# Patient Record
Sex: Female | Born: 1970
Health system: Southern US, Community
[De-identification: ages and names within clinical notes are randomized; demographics above are authoritative.]

## PROBLEM LIST (undated history)

## (undated) DIAGNOSIS — F509 Eating disorder, unspecified: Secondary | ICD-10-CM

## (undated) DIAGNOSIS — F419 Anxiety disorder, unspecified: Secondary | ICD-10-CM

## (undated) DIAGNOSIS — T7840XA Allergy, unspecified, initial encounter: Secondary | ICD-10-CM

## (undated) DIAGNOSIS — B019 Varicella without complication: Secondary | ICD-10-CM

## (undated) DIAGNOSIS — N3289 Other specified disorders of bladder: Secondary | ICD-10-CM

## (undated) DIAGNOSIS — R87619 Unspecified abnormal cytological findings in specimens from cervix uteri: Secondary | ICD-10-CM

## (undated) DIAGNOSIS — E079 Disorder of thyroid, unspecified: Secondary | ICD-10-CM

## (undated) DIAGNOSIS — J45909 Unspecified asthma, uncomplicated: Secondary | ICD-10-CM

## (undated) DIAGNOSIS — E559 Vitamin D deficiency, unspecified: Secondary | ICD-10-CM

## (undated) DIAGNOSIS — N3281 Overactive bladder: Secondary | ICD-10-CM

## (undated) HISTORY — DX: Vitamin D deficiency, unspecified: E55.9

## (undated) HISTORY — DX: Anxiety disorder, unspecified: F41.9

## (undated) HISTORY — DX: Allergy, unspecified, initial encounter: T78.40XA

## (undated) HISTORY — DX: Varicella without complication: B01.9

## (undated) HISTORY — DX: Unspecified abnormal cytological findings in specimens from cervix uteri: R87.619

## (undated) HISTORY — PX: WISDOM TOOTH EXTRACTION: SHX21

## (undated) HISTORY — DX: Eating disorder, unspecified: F50.9

## (undated) HISTORY — DX: Disorder of thyroid, unspecified: E07.9

## (undated) HISTORY — DX: Overactive bladder: N32.81

## (undated) HISTORY — DX: Other specified disorders of bladder: N32.89

---
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
## 1998-09-04 HISTORY — PX: BREAST BIOPSY: SHX20

## 2003-09-05 HISTORY — PX: HYMENECTOMY: SHX987

## 2007-09-05 HISTORY — PX: CERVICAL CERCLAGE: SHX1329

## 2008-10-07 ENCOUNTER — Encounter: Admission: RE | Admit: 2008-10-07 | Discharge: 2008-10-07 | Payer: Self-pay | Admitting: Unknown Physician Specialty

## 2009-04-19 ENCOUNTER — Encounter: Admission: RE | Admit: 2009-04-19 | Discharge: 2009-04-19 | Payer: Self-pay | Admitting: Unknown Physician Specialty

## 2012-11-14 DIAGNOSIS — Z8639 Personal history of other endocrine, nutritional and metabolic disease: Secondary | ICD-10-CM | POA: Insufficient documentation

## 2013-04-16 ENCOUNTER — Encounter: Payer: Self-pay | Admitting: Internal Medicine

## 2013-04-16 ENCOUNTER — Ambulatory Visit (INDEPENDENT_AMBULATORY_CARE_PROVIDER_SITE_OTHER): Payer: BC Managed Care – PPO | Admitting: Internal Medicine

## 2013-04-16 VITALS — BP 110/70 | HR 87 | Temp 97.4°F | Resp 20 | Ht 65.0 in | Wt 153.0 lb

## 2013-04-16 DIAGNOSIS — R946 Abnormal results of thyroid function studies: Secondary | ICD-10-CM

## 2013-04-16 DIAGNOSIS — B9689 Other specified bacterial agents as the cause of diseases classified elsewhere: Secondary | ICD-10-CM

## 2013-04-16 DIAGNOSIS — A499 Bacterial infection, unspecified: Secondary | ICD-10-CM

## 2013-04-16 DIAGNOSIS — R7989 Other specified abnormal findings of blood chemistry: Secondary | ICD-10-CM

## 2013-04-16 DIAGNOSIS — N926 Irregular menstruation, unspecified: Secondary | ICD-10-CM

## 2013-04-16 DIAGNOSIS — E559 Vitamin D deficiency, unspecified: Secondary | ICD-10-CM

## 2013-04-16 DIAGNOSIS — N760 Acute vaginitis: Secondary | ICD-10-CM

## 2013-04-16 LAB — COMPREHENSIVE METABOLIC PANEL
ALT: 11 U/L (ref 0–35)
AST: 14 U/L (ref 0–37)
CO2: 29 mEq/L (ref 19–32)
Calcium: 9.2 mg/dL (ref 8.4–10.5)
Chloride: 104 mEq/L (ref 96–112)
Creat: 0.73 mg/dL (ref 0.50–1.10)
Sodium: 137 mEq/L (ref 135–145)
Total Protein: 6.8 g/dL (ref 6.0–8.3)

## 2013-04-16 LAB — LIPID PANEL
HDL: 56 mg/dL (ref >39)
LDL Cholesterol: 74 mg/dL (ref 0–99)
Total CHOL/HDL Ratio: 2.6 Ratio

## 2013-04-16 LAB — CBC WITH DIFFERENTIAL/PLATELET
Basophils Absolute: 0 10*3/uL (ref 0.0–0.1)
Lymphocytes Relative: 28 % (ref 12–46)
Lymphs Abs: 2.2 10*3/uL (ref 0.7–4.0)
Neutro Abs: 4.6 10*3/uL (ref 1.7–7.7)
Neutrophils Relative %: 60 % (ref 43–77)
Platelets: 244 10*3/uL (ref 150–400)
RBC: 4.37 MIL/uL (ref 3.87–5.11)
RDW: 13.2 % (ref 11.5–15.5)
WBC: 7.7 10*3/uL (ref 4.0–10.5)

## 2013-04-16 NOTE — Progress Notes (Addendum)
  Subjective:    Patient ID: Jamie Miller, female    DOB: [birth year], 42 y.o.   MRN: [id number]  HPI Jamie Miller is here as a new pt for first visit  PMH of abnormal thyroid test, vitamin D deficiency, chronic bacterial vaginosis, and allergic rhinitis.  She is concerned if she is peri-menopausal.  She reports irregular periods  With heavy menses at times. She has been evaluated at [facility] for this and tells me she had an ultrasound.  I do not have those results.    She also notes she is irritable around her menses.     Review of Systems See HPI    Objective:   Physical Exam Physical Exam  Nursing note and vitals reviewed.  Constitutional: She is oriented to person, place, and time. She appears well-developed and well-nourished.  HENT:  Head: Normocephalic and atraumatic.  Cardiovascular: Normal rate and regular rhythm. Exam reveals no gallop and no friction rub.  No murmur heard.  Pulmonary/Chest: Breath sounds normal. She has no wheezes. She has no rales.  Neurological: She is alert and oriented to person, place, and time.  Skin: Skin is warm and dry.  Psychiatric: She has a normal mood and affect. Her behavior is normal.              Assessment & Plan:  Irregular menses    Will check labs TSH, prolactin,  FSH.  Will need GYN notes  Vitamin D deficinecy  Allergic rhintits  History of bacterial vaginosis  Follow up with me in 10 days  Addendum  8/17  Review of faxed notes from Dr. Rito Ehrlich  U/S was performed and reported as normal in office note of 02/28/2013.  See scanned report 6/27

## 2013-04-17 LAB — FOLLICLE STIMULATING HORMONE: FSH: 7.3 m[IU]/mL

## 2013-04-17 LAB — T3, FREE: T3, Free: 3.2 pg/mL (ref 2.3–4.2)

## 2013-04-17 LAB — TSH: TSH: 4.661 u[IU]/mL — ABNORMAL HIGH (ref 0.350–4.500)

## 2013-04-17 LAB — T4, FREE: Free T4: 1.24 ng/dL (ref 0.80–1.80)

## 2013-04-17 LAB — PROLACTIN: Prolactin: 9.2 ng/mL

## 2013-04-18 ENCOUNTER — Encounter: Payer: Self-pay | Admitting: *Deleted

## 2013-04-21 ENCOUNTER — Encounter: Payer: Self-pay | Admitting: *Deleted

## 2013-04-29 ENCOUNTER — Ambulatory Visit (INDEPENDENT_AMBULATORY_CARE_PROVIDER_SITE_OTHER): Payer: BC Managed Care – PPO | Admitting: Internal Medicine

## 2013-04-29 ENCOUNTER — Encounter: Payer: Self-pay | Admitting: Internal Medicine

## 2013-04-29 VITALS — BP 112/77 | HR 80 | Temp 96.8°F | Resp 18 | Wt 153.0 lb

## 2013-04-29 DIAGNOSIS — R7989 Other specified abnormal findings of blood chemistry: Secondary | ICD-10-CM

## 2013-04-29 DIAGNOSIS — R946 Abnormal results of thyroid function studies: Secondary | ICD-10-CM

## 2013-04-29 DIAGNOSIS — N926 Irregular menstruation, unspecified: Secondary | ICD-10-CM

## 2013-04-29 DIAGNOSIS — R454 Irritability and anger: Secondary | ICD-10-CM | POA: Insufficient documentation

## 2013-04-29 NOTE — Progress Notes (Signed)
Subjective:    Patient ID: Jamie Miller, female    DOB: Oct 06, 1970, 42 y.o.   MRN: [id number]  HPI Jamie Miller is here for follow up.  She states she is feeling some better since she has less child rearing responsibilities during the daytime now that her daughter has started kindergarten.    She states she still has some irritability about 10 days prior to menses.  She does not wish to start an anti-depressant or any prescribed medication for this  See labs  TSH minimally elevateed with normal free levels.  She denies symptoms of weight gain,  Hair loss dry skin.  NO history of neck or chest radiation.  She denies heavy menses or clotting  Allergies  Allergen Reactions  . Almond Oil   . Keflex [Cephalexin]   . Nickel   . Other     Sensitive to all abx    History reviewed. No pertinent past medical history. Past Surgical History  Procedure Laterality Date  . Appendectomy    . Hymenectomy    . Cervical cerclage     History   Social History  . Marital Status: Unknown    Spouse Name: N/A    Number of Children: N/A  . Years of Education: N/A   Occupational History  . Not on file.   Social History Main Topics  . Smoking status: Never Smoker   . Smokeless tobacco: Never Used  . Alcohol Use: No  . Drug Use: No  . Sexual Activity: Yes    Birth Control/ Protection: Surgical   Other Topics Concern  . Not on file   Social History Narrative  . No narrative on file   Family History  Problem Relation Age of Onset  . Hyperlipidemia Mother   . Cancer Father     prostate  . Cancer Maternal Grandfather     prostate  . Cancer Paternal Grandfather     prostate   Patient Active Problem List   Diagnosis Date Noted  . Irritability 04/29/2013  . Irregular menses 04/16/2013  . Abnormal thyroid blood test 04/16/2013  . Bacterial vaginosis 04/16/2013  . Vitamin D deficiency 04/16/2013   Current Outpatient Prescriptions on File Prior to Visit  Medication Sig Dispense Refill  .  cholecalciferol (VITAMIN D) 1000 UNITS tablet Take 4,000 Units by mouth daily.      . fish oil-omega-3 fatty acids 1000 MG capsule Take 2 g by mouth daily.      . vitamin E 200 UNIT capsule Take 200 Units by mouth daily.      . Boric Acid 4 % SOLN Place vaginally.      . Loratadine 10 MG CAPS Take 10 mg by mouth as needed.      . MULTIPLE VITAMIN PO Take 1 tablet by mouth daily.       No current facility-administered medications on file prior to visit.       Review of Systems See HPi    Objective:   Physical Exam Physical Exam  Nursing note and vitals reviewed.  Constitutional: She is oriented to person, place, and time. She appears well-developed and well-nourished.  HENT:  Head: Normocephalic and atraumatic.  Neck  No thyromegaly no discrete nodules on thyroid exam Cardiovascular: Normal rate and regular rhythm. Exam reveals no gallop and no friction rub.  No murmur heard.  Pulmonary/Chest: Breath sounds normal. She has no wheezes. She has no rales.  Neurological: She is alert and oriented to person, place, and time.  Skin: Skin is warm and dry.  Psychiatric: She has a normal mood and affect. Her behavior is normal.             Assessment & Plan:  Irritability/ ??  PMS     She does not wish any prescribed meds for this.    Minimally elevated TSH  will moniter expectantly  She has no S/S of hypothyroidism or radiation exposure  Irregular menses  ADvised to follow up with Dr. Rito Ehrlich for this especially if menses are heavy

## 2014-07-06 ENCOUNTER — Encounter: Payer: Self-pay | Admitting: Internal Medicine

## 2014-09-16 DIAGNOSIS — E663 Overweight: Secondary | ICD-10-CM | POA: Insufficient documentation

## 2015-08-10 ENCOUNTER — Encounter: Payer: Self-pay | Admitting: Obstetrics & Gynecology

## 2015-08-10 ENCOUNTER — Ambulatory Visit (INDEPENDENT_AMBULATORY_CARE_PROVIDER_SITE_OTHER): Payer: BLUE CROSS/BLUE SHIELD | Admitting: Obstetrics & Gynecology

## 2015-08-10 VITALS — Ht 65.0 in | Wt 154.0 lb

## 2015-08-10 DIAGNOSIS — N898 Other specified noninflammatory disorders of vagina: Secondary | ICD-10-CM | POA: Diagnosis not present

## 2015-08-10 MED ORDER — CLOBETASOL PROPIONATE 0.05 % EX OINT: TOPICAL_OINTMENT | CUTANEOUS | Status: DC

## 2015-08-10 MED ORDER — LIDOCAINE HCL 2 % EX GEL: 1.0000 "application " | CUTANEOUS | Status: DC | PRN

## 2015-08-10 MED ORDER — FLUCONAZOLE 150 MG PO TABS: 150.0000 mg | ORAL_TABLET | Freq: Once | ORAL | Status: DC

## 2015-08-10 NOTE — Progress Notes (Signed)
   Subjective:    Patient ID: Jamie Miller, female    DOB: 02-Jan-1971, 44 y.o.   MRN: [id number]  HPI 44 yo MW P24 (31 yo daughter) here today as a new patient with the issue of a week's h/o vaginal/anal itching/burning/redness. She had some vaginal discharge but it decreased and is sporadic. She used hydrocortisone OTC and zinc oxide cream, baking soda baths.   Review of Systems Pap smear at [facility] last week and normal  She was treated for a UTI last week She also wore clothes washed with a different detergent. She reports longstanding dyspareunia    Objective:   Physical Exam  WNWHWFNAD Breathing and conversing normally Abd- benign EG- symmetric erythema of both labia and perianal area, c/w contact dermatitis Speculum exam reveals a bit of thick white discharge      Assessment & Plan:  Contract dermatitis Possible yeast- wet prep sent Topical lidocaine, diflucan, and temovate prescribed

## 2015-08-11 ENCOUNTER — Telehealth: Payer: Self-pay | Admitting: *Deleted

## 2015-08-11 LAB — WET PREP BY MOLECULAR PROBE
CANDIDA SPECIES: POSITIVE — AB
GARDNERELLA VAGINALIS: NEGATIVE
TRICHOMONAS VAG: NEGATIVE

## 2015-08-11 NOTE — Telephone Encounter (Signed)
Pt notified of positive yeast and appropriate RX was sent to her pharmacy

## 2015-08-13 ENCOUNTER — Encounter: Payer: Self-pay | Admitting: Obstetrics & Gynecology

## 2015-08-23 ENCOUNTER — Encounter: Payer: Self-pay | Admitting: Obstetrics & Gynecology

## 2015-08-25 ENCOUNTER — Ambulatory Visit (INDEPENDENT_AMBULATORY_CARE_PROVIDER_SITE_OTHER): Payer: BLUE CROSS/BLUE SHIELD | Admitting: Obstetrics & Gynecology

## 2015-08-25 ENCOUNTER — Encounter: Payer: Self-pay | Admitting: Obstetrics & Gynecology

## 2015-08-25 VITALS — BP 115/74 | HR 77 | Resp 16 | Ht 65.0 in | Wt 153.0 lb

## 2015-08-25 DIAGNOSIS — N941 Unspecified dyspareunia: Secondary | ICD-10-CM | POA: Insufficient documentation

## 2015-08-25 DIAGNOSIS — N898 Other specified noninflammatory disorders of vagina: Secondary | ICD-10-CM | POA: Diagnosis not present

## 2015-08-25 DIAGNOSIS — R350 Frequency of micturition: Secondary | ICD-10-CM

## 2015-08-25 LAB — POCT URINALYSIS DIPSTICK
BILIRUBIN UA: NEGATIVE
Blood, UA: NEGATIVE
GLUCOSE UA: NEGATIVE
KETONES UA: NEGATIVE
LEUKOCYTES UA: NEGATIVE
Nitrite, UA: NEGATIVE
Protein, UA: NEGATIVE
Spec Grav, UA: 1.02
Urobilinogen, UA: 0.2
pH, UA: 5

## 2015-08-25 NOTE — Progress Notes (Signed)
Patient ID: Jamie Miller, female   DOB: Feb 27, 1971, 44 y.o.   MRN: [id number] Pt here today for eval of vaginal discharge and odor. Pt also states she has urinary frequency and right side abd pain. Sx has been persistent for 3 days.

## 2015-08-25 NOTE — Progress Notes (Signed)
   Subjective:    Patient ID: Jamie Miller, female    DOB: 15-May-1971, 44 y.o.   MRN: [id number]  HPI  44 yo female presents for urinary frequency and pain.  She also having vaginal discharge.  Pt recently treated with diflucan on 08/09/14.  Symptoms improved but returned with the urinary frequency adn bladder spasms occurred.  Pt has long standing history of vaginal and pelvic pain.  Intercourse is painful.  [facility] which has helped.  Review of Systems  Constitutional: Negative.   Gastrointestinal: Negative.   Genitourinary: Positive for frequency, vaginal discharge, vaginal pain, pelvic pain and dyspareunia.  Psychiatric/Behavioral: Negative.        Objective:   Physical Exam  Constitutional: She appears well-developed and well-nourished. No distress.  HENT:  Head: Normocephalic and atraumatic.  Eyes: Conjunctivae are normal.  Pulmonary/Chest: Effort normal.  Abdominal: Soft. She exhibits no distension. There is no tenderness.  Genitourinary:  Vulva--Taneer V, no lesion, non tneder with q tip around introitus (no evidence of vulvodynia) Vagina-clear mucous, no blood, no lesion.  There is ridge of tissue (?scarring from ob laceration) about 2 cm into vagina that is tneder Uterus-mobile, mild tenderness Adnexa-no masses, non tender  Skin: Skin is warm and dry.  Contact dermatitis resolved  Psychiatric: She has a normal mood and affect.  Vitals reviewed.         Assessment & Plan:  44 yo female with 2 new problems and 1 long standing problem  1-urinary frequency and bladder spasms--UA nml today U cx sent.  Will treat on results 2-vaginal discharge--return of vaginal discharge.  BD affirm sent.  No obvious infection to treat today 3-pelvic pain and dyspareunia--continue physical therapy.  Consider surgery to remove scar tissue in vagina.

## 2015-08-26 ENCOUNTER — Encounter: Payer: Self-pay | Admitting: Obstetrics & Gynecology

## 2015-08-26 LAB — WET PREP BY MOLECULAR PROBE
CANDIDA SPECIES: NEGATIVE
Gardnerella vaginalis: NEGATIVE
TRICHOMONAS VAG: NEGATIVE

## 2015-08-26 LAB — CULTURE, URINE COMPREHENSIVE
Colony Count: NO GROWTH
Organism ID, Bacteria: NO GROWTH

## 2016-01-26 ENCOUNTER — Encounter: Payer: Self-pay | Admitting: Obstetrics & Gynecology

## 2016-01-27 ENCOUNTER — Other Ambulatory Visit: Payer: BLUE CROSS/BLUE SHIELD

## 2016-01-27 ENCOUNTER — Ambulatory Visit (INDEPENDENT_AMBULATORY_CARE_PROVIDER_SITE_OTHER): Payer: 59 | Admitting: Obstetrics & Gynecology

## 2016-01-27 ENCOUNTER — Encounter: Payer: Self-pay | Admitting: Obstetrics & Gynecology

## 2016-01-27 VITALS — BP 113/74 | HR 89 | Temp 97.1°F | Resp 16 | Ht 65.0 in | Wt 154.0 lb

## 2016-01-27 DIAGNOSIS — N3289 Other specified disorders of bladder: Secondary | ICD-10-CM

## 2016-01-27 DIAGNOSIS — R102 Pelvic and perineal pain: Secondary | ICD-10-CM | POA: Diagnosis not present

## 2016-01-27 LAB — POCT URINALYSIS DIPSTICK
Bilirubin, UA: NEGATIVE
Blood, UA: NEGATIVE
GLUCOSE UA: NEGATIVE
KETONES UA: NEGATIVE
Leukocytes, UA: NEGATIVE
Nitrite, UA: NEGATIVE
Protein, UA: NEGATIVE
Urobilinogen, UA: NEGATIVE
pH, UA: 6.5

## 2016-01-27 NOTE — Progress Notes (Signed)
   Subjective:    Patient ID: Jamie Miller, female    DOB: 08-Jan-1971, 45 y.o.   MRN: [id number]  HPI 45 yo MW P1 here with continued bladder spasms, "OAB". She also reports that she saw some vaginal discharge this morning and may have a little vaginal itch.   Review of Systems     Objective:   Physical Exam WNWHWFNAD Breathing, conversing, and ambulating normally Vagina- moderate atrophy, paucity of discharge, no odor      Assessment & Plan:  Bladder spasms- culture sent (UA negative), urispas prescribed prn Wet prep sent, but I expect this to be normal

## 2016-01-28 LAB — WET PREP FOR TRICH, YEAST, CLUE
TRICH WET PREP: NONE SEEN
WBC, Wet Prep HPF POC: NONE SEEN
Yeast Wet Prep HPF POC: NONE SEEN

## 2016-01-29 LAB — CULTURE, URINE COMPREHENSIVE
COLONY COUNT: NO GROWTH
ORGANISM ID, BACTERIA: NO GROWTH

## 2016-02-01 ENCOUNTER — Telehealth: Payer: Self-pay | Admitting: *Deleted

## 2016-02-01 NOTE — Telephone Encounter (Signed)
No answer on home phone or answering machine

## 2016-02-03 ENCOUNTER — Encounter: Payer: Self-pay | Admitting: Obstetrics & Gynecology

## 2016-04-17 ENCOUNTER — Encounter: Payer: Self-pay | Admitting: Obstetrics & Gynecology

## 2016-04-20 ENCOUNTER — Ambulatory Visit: Payer: 59 | Admitting: Obstetrics & Gynecology

## 2016-10-03 ENCOUNTER — Ambulatory Visit (HOSPITAL_BASED_OUTPATIENT_CLINIC_OR_DEPARTMENT_OTHER)
Admission: RE | Admit: 2016-10-03 | Discharge: 2016-10-03 | Disposition: A | Discharge status: Home / Self Care | Payer: 59 | Source: Ambulatory Visit | Attending: Family Medicine | Admitting: Family Medicine

## 2016-10-03 ENCOUNTER — Encounter: Payer: Self-pay | Admitting: Family Medicine

## 2016-10-03 ENCOUNTER — Ambulatory Visit (INDEPENDENT_AMBULATORY_CARE_PROVIDER_SITE_OTHER): Payer: 59 | Admitting: Family Medicine

## 2016-10-03 ENCOUNTER — Telehealth: Payer: Self-pay | Admitting: Family Medicine

## 2016-10-03 VITALS — BP 125/84 | HR 87 | Temp 98.1°F | Resp 20 | Ht 63.5 in | Wt 145.2 lb

## 2016-10-03 DIAGNOSIS — R1030 Lower abdominal pain, unspecified: Secondary | ICD-10-CM | POA: Insufficient documentation

## 2016-10-03 DIAGNOSIS — N926 Irregular menstruation, unspecified: Secondary | ICD-10-CM

## 2016-10-03 DIAGNOSIS — Z7689 Persons encountering health services in other specified circumstances: Secondary | ICD-10-CM

## 2016-10-03 LAB — COMPREHENSIVE METABOLIC PANEL
ALT: 43 U/L — ABNORMAL HIGH (ref 0–35)
AST: 24 U/L (ref 0–37)
Albumin: 4.2 g/dL (ref 3.5–5.2)
Alkaline Phosphatase: 77 U/L (ref 39–117)
BUN: 13 mg/dL (ref 6–23)
CHLORIDE: 103 meq/L (ref 96–112)
CO2: 30 meq/L (ref 19–32)
Calcium: 9.3 mg/dL (ref 8.4–10.5)
Creatinine, Ser: 0.72 mg/dL (ref 0.40–1.20)
GFR: 92.95 mL/min (ref >60.00)
GLUCOSE: 100 mg/dL — AB (ref 70–99)
POTASSIUM: 4.6 meq/L (ref 3.5–5.1)
SODIUM: 138 meq/L (ref 135–145)
Total Bilirubin: 0.5 mg/dL (ref 0.2–1.2)
Total Protein: 7 g/dL (ref 6.0–8.3)

## 2016-10-03 LAB — LIPASE: Lipase: 40 U/L (ref 11.0–59.0)

## 2016-10-03 LAB — URINALYSIS, ROUTINE W REFLEX MICROSCOPIC
BILIRUBIN URINE: NEGATIVE
Hgb urine dipstick: NEGATIVE
KETONES UR: NEGATIVE
LEUKOCYTES UA: NEGATIVE
NITRITE: NEGATIVE
PH: 6 (ref 5.0–8.0)
Total Protein, Urine: NEGATIVE
URINE GLUCOSE: NEGATIVE
Urobilinogen, UA: 0.2 (ref 0.0–1.0)

## 2016-10-03 LAB — CBC WITH DIFFERENTIAL/PLATELET
BASOS PCT: 0.7 % (ref 0.0–3.0)
Basophils Absolute: 0.1 10*3/uL (ref 0.0–0.1)
EOS PCT: 1.1 % (ref 0.0–5.0)
Eosinophils Absolute: 0.1 10*3/uL (ref 0.0–0.7)
HCT: 42.6 % (ref 36.0–46.0)
Hemoglobin: 14.5 g/dL (ref 12.0–15.0)
LYMPHS ABS: 1.4 10*3/uL (ref 0.7–4.0)
Lymphocytes Relative: 18 % (ref 12.0–46.0)
MCHC: 34.1 g/dL (ref 30.0–36.0)
MCV: 93.5 fl (ref 78.0–100.0)
MONO ABS: 0.7 10*3/uL (ref 0.1–1.0)
Monocytes Relative: 8.6 % (ref 3.0–12.0)
NEUTROS ABS: 5.5 10*3/uL (ref 1.4–7.7)
NEUTROS PCT: 71.6 % (ref 43.0–77.0)
PLATELETS: 230 10*3/uL (ref 150.0–400.0)
RBC: 4.55 Mil/uL (ref 3.87–5.11)
RDW: 13 % (ref 11.5–15.5)
WBC: 7.7 10*3/uL (ref 4.0–10.5)

## 2016-10-03 LAB — H. PYLORI ANTIBODY, IGG: H Pylori IgG: NEGATIVE

## 2016-10-03 LAB — C-REACTIVE PROTEIN: CRP: 0.3 mg/dL — AB (ref 0.5–20.0)

## 2016-10-03 LAB — POCT URINE PREGNANCY: PREG TEST UR: NEGATIVE

## 2016-10-03 NOTE — Telephone Encounter (Signed)
Spoke with patient reviewed xray results and instructions. Patient verbalized understanding. 

## 2016-10-03 NOTE — Telephone Encounter (Signed)
Please call pt: - her xray of her abdomen resulted with a rather significant stool burden, which is the likely cause of her discomfort.  - in addition to what we discussed during her appt, I would like her to start a cap of miralax twice a day for 2 days, then decrease to 1 cap daily until BMs are moving daily and stomach pain/cramping improved.  - we will call her with lab results once available.

## 2016-10-03 NOTE — Progress Notes (Signed)
Patient ID: Jamie Miller, female  DOB: 1971/07/15, 46 y.o.   MRN: [id number] Patient Care Team    Relationship Specialty Notifications Start End  Jamie Hillock, DO PCP - General Family Medicine  10/03/16     Subjective:  Jamie Miller is a 46 y.o.  female present for new patient establishment. All past medical history, surgical history, allergies, family history, immunizations, medications and social history were obtained and updated in the electronic medical record today. All recent labs, ED visits and hospitalizations within the last year were reviewed.  Pt presents today to establish and discuss her abdominal pain. She states Her discomfort started with left sided abdominal cramping last week. This has improved but is now a continued  dull ache. Her bowel movements were loose for 2 days surrounding the cramping, then she went a few days without a BM. She feels they have been normal for the last 2 days. She denies nausea, vomit, melena, hematochezia,  dysuria, fever or chills. She endorses late menses, increased belching and early satiety.  She reports her menstrual cycle is overdue, usually 21 days between cycles and was due beginning of December. Her husband had a vasectomy many years ago. She started to taking prilosec 2 days ago to help with symptoms and she thought it helped the first day only. She does note she started eating "healthier" a few days prior to the onset and had realized she was consuming more acidic foods and drinking moire coffee. She also tried metamucil and that was not effective.    There is no immunization history on file for this patient.   Past Medical History:  Diagnosis Date  . Allergy   . Anxiety   . Chicken pox   . Thyroid disease    was on Jamie dose synthroid at one time, but removed and levels have been "normal" since and followed thorugh GYN   Allergies  Allergen Reactions  . Dairy Aid  [Lactase] Rash  . Eggs Or Egg-Derived Products Rash  . Hemp  Seed Oil  [Dronabinol] Rash    itching  . Levothyroxine     Other reaction(s): Other Tingling in legs; heart palpitation  . Metronidazole     Other reaction(s): Other Cramps  . Nitrofurantoin Monohyd Macro Diarrhea    Other reaction(s): Other Cramps  . Strawberry (Diagnostic) Rash    itching  . Wheat Bran Rash  . Almond Oil   . Keflex [Cephalexin]   . Nickel   . Other     Sensitive to all abx   . Clindamycin     Other reaction(s): Abdominal Pain  . Cranberry     Other reaction(s): Abdominal Pain   Past Surgical History:  Procedure Laterality Date  . APPENDECTOMY  1990  . BREAST BIOPSY  2000   biopsy  . CERVICAL CERCLAGE  2009  . HYMENECTOMY  2005   Family History  Problem Relation Age of Onset  . Hyperlipidemia Mother   . Diabetes Father   . Prostate cancer Father     prostate  . Prostate cancer Maternal Grandfather     prostate  . Prostate cancer Paternal Grandfather     prostate  . Arthritis Maternal Grandmother    Social History   Social History  . Marital status: Married    Spouse name: Jamie Miller  . Number of children: 1  . Years of education: N/A   Occupational History  . Not on file.   Social History Main Topics  .  Smoking status: Never Smoker  . Smokeless tobacco: Never Used  . Alcohol use No  . Drug use: No  . Sexual activity: Yes    Partners: Male    Birth control/ protection: Surgical     Comment: married   Other Topics Concern  . Not on file   Social History Narrative   Married to Jamie Miller. Has a daughter Jamie Miller.    College degree.    Drinks caffeine. Uses herbal remedies. Takes a daily vitamin.    Wears her seatbelt   Smoke detector in the home.    Feels safe in her relationships.    Allergies as of 10/03/2016      Reactions   Dairy Aid  [lactase] Rash   Eggs Or Egg-derived Products Rash   Hemp Seed Oil  [dronabinol] Rash   itching   Levothyroxine    Other reaction(s): Other Tingling in legs; heart palpitation   Metronidazole     Other reaction(s): Other Cramps   Nitrofurantoin Monohyd Macro Diarrhea   Other reaction(s): Other Cramps   Strawberry (diagnostic) Rash   itching   Wheat Bran Rash   Almond Oil    Keflex [cephalexin]    Nickel    Other    Sensitive to all abx    Clindamycin    Other reaction(s): Abdominal Pain   Cranberry    Other reaction(s): Abdominal Pain      Medication List       Accurate as of 10/03/16 11:56 AM. Always use your most recent med list.          Acidophilus/Goat Milk Caps Take by mouth.   Boric Acid 4 % Soln Place vaginally. Reported on 01/27/2016   Loratadine 10 MG Caps Take 10 mg by mouth as needed. Reported on 01/27/2016   MULTIPLE VITAMIN PO Take 1 tablet by mouth daily.        Recent Results (from the past 2160 hour(s))  POCT urine pregnancy     Status: Normal   Collection Time: 10/03/16 10:49 AM  Result Value Ref Range   Preg Test, Ur Negative Negative    US Breast Left  Result Date: 04/19/2009 Clinical Data:  The patient returns for short interval follow-up of a probably benign finding in the upper outer quadrant of the left breast.  DIGITAL DIAGNOSTIC  LEFT  MAMMOGRAM  WITH CAD AND LEFT BREAST ULTRASOUND:  Comparison: 08/31/2008 and 09/29/2008 from [facility] in [place], [place] and 03/05/2000 from [facility] in [place], [place]  Findings: The breast tissue is almost entirely fatty.  The vague Jamie density in the upper outer quadrant posteriorly is stable.  No suspicious mass, distortion or calcification to suggest malignancy is identified. Mammographic images were processed with CAD.  On physical exam, no mass is palpated in the left upper outer quadrant.  The patient asked about a skin lesion in the outer portion of the left breast which appears to represent a cherry angioma.  Ultrasound is performed, showing an area of mixed echogenicity at 2:30 o'clock 7 cm from the left nipple measuring about 1.8 cm. This  is felt likely to represent a benign fibroadenolipoma (hamartoma). This is stable as compared to prior exam.  IMPRESSION: Stable benign appearing density in the outer portion the left breast which is felt to be consistent with a benign fibroadenolipoma (hamartoma).  Yearly screening mammography should begin at age 35 unless clinically indicated earlier.  BI-RADS CATEGORY 2:  Benign finding(s). Provider: Karna Dupes, Benjamine Mola  [facility] [place]  Mm Digital Diagnostic Unilat L  Result Date: 04/19/2009 Clinical Data:  The patient returns for short interval follow-up of a probably benign finding in the upper outer quadrant of the left breast.  DIGITAL DIAGNOSTIC  LEFT  MAMMOGRAM  WITH CAD AND LEFT BREAST ULTRASOUND:  Comparison: 08/31/2008 and 09/29/2008 from [facility] in [place], [place] and 03/05/2000 from [facility] in [place], [place]  Findings: The breast tissue is almost entirely fatty.  The vague Jamie density in the upper outer quadrant posteriorly is stable.  No suspicious mass, distortion or calcification to suggest malignancy is identified. Mammographic images were processed with CAD.  On physical exam, no mass is palpated in the left upper outer quadrant.  The patient asked about a skin lesion in the outer portion of the left breast which appears to represent a cherry angioma.  Ultrasound is performed, showing an area of mixed echogenicity at 2:30 o'clock 7 cm from the left nipple measuring about 1.8 cm. This is felt likely to represent a benign fibroadenolipoma (hamartoma). This is stable as compared to prior exam.  IMPRESSION: Stable benign appearing density in the outer portion the left breast which is felt to be consistent with a benign fibroadenolipoma (hamartoma).  Yearly screening mammography should begin at age 65 unless clinically indicated earlier.  BI-RADS CATEGORY 2:  Benign finding(s). Provider: Lou Cal    ROS: 14 pt review of systems performed  and negative (unless mentioned in an HPI)  Objective: BP 125/84 (BP Location: Left Arm, Patient Position: Sitting, Cuff Size: Normal)   Pulse 87   Temp 98.1 F (36.7 C)   Resp 20   Ht 5' 3.5" (1.613 m)   Wt 145 lb 4 oz (65.9 kg)   LMP 08/07/2016   SpO2 97%   BMI 25.33 kg/m  Gen: Afebrile. No acute distress. Nontoxic in appearance, well-developed, well-nourished HENT: AT. [place].MMM Eyes:Pupils Equal Round Reactive to light, Extraocular movements intact,  Conjunctiva without redness, discharge or icterus. Neck/lymp/endocrine: Supple,no lymphadenopathy CV: RRR, no edema Chest: CTAB, no wheeze, rhonchi or crackles. Abd: hard midline lower abd. TTP lower midline abd. Flat. ND. BS present  ? Stool burden/Masses palpated. No hepatosplenomegaly. No rebound tenderness or guarding. Skin:  Warm and well-perfused. Skin intact. Neuro/Msk: Normal gait. PERLA. EOMi. Alert. Oriented x3.     Assessment/plan: Jamie Miller is a 46 y.o. female present for establishment with abd discomfort.  Lower abdominal pain - uncertain etiology of her discomfort at this time. Recommended she take Prilosec and start a bland diet until we get further answers by labs and imaging. She did lower abd fullness/hardness. TTP over this area. Discussed possibility of stool burden/constipation vs GERD gastritis vs uterine/GYN cause.  - increase water consumption to 60-80 ounces daily. - CBC w/Diff - Comp Met (CMET) - Lipase - H. pylori antibody, IgG - C-reactive protein - POCT urine pregnancy - DG Abd 2 Views; Future - Urinalysis, Routine w reflex microscopic Irregular menses - POCT urine pregnancy: NEGATIVE   Return in about 2 weeks (around 10/17/2016), or if symptoms worsen or fail to improve.  Electronically signed by: Howard Pouch, DO [place]

## 2016-10-03 NOTE — Patient Instructions (Signed)
It was a pleasure meeting you today.   Avoid spicy foods, try bland soft foods for a few days. Drink at least 60-80 ounces of water daily.  Take the prilosec over the counter daily until you hear from [place].  Please have your xray completed today at [place] off [address]  We will call you once we get all results.  This possibly could be constipation.     Constipation, Adult Constipation is when a person:  Poops (has a bowel movement) fewer times in a week than normal.  Has a hard time pooping.  Has poop that is dry, hard, or bigger than normal. Follow these instructions at home: Eating and drinking  Eat foods that have a lot of fiber, such as:  Fresh fruits and vegetables.  Whole grains.  Beans.  Eat less of foods that are high in fat, low in fiber, or overly processed, such as:  [place] fries.  Hamburgers.  Cookies.  Candy.  Soda.  Drink enough fluid to keep your pee (urine) clear or pale yellow. General instructions  Exercise regularly or as told by your doctor.  Go to the restroom when you feel like you need to poop. Do not hold it in.  Take over-the-counter and prescription medicines only as told by your doctor. These include any fiber supplements.  Do pelvic floor retraining exercises, such as:  Doing deep breathing while relaxing your lower belly (abdomen).  Relaxing your pelvic floor while pooping.  Watch your condition for any changes.  Keep all follow-up visits as told by your doctor. This is important. Contact a doctor if:  You have pain that gets worse.  You have a fever.  You have not pooped for 4 days.  You throw up (vomit).  You are not hungry.  You lose weight.  You are bleeding from the anus.  You have thin, pencil-like poop (stool). Get help right away if:  You have a fever, and your symptoms suddenly get worse.  You leak poop or have blood in your poop.  Your belly feels hard or bigger than normal  (is bloated).  You have very bad belly pain.  You feel dizzy or you faint. This information is not intended to replace advice given to you by your health care provider. Make sure you discuss any questions you have with your health care provider. Document Released: 02/07/2008 Document Revised: 03/10/2016 Document Reviewed: 02/09/2016 Elsevier Interactive Patient Education  2017 [employer].

## 2016-10-04 ENCOUNTER — Telehealth: Payer: Self-pay | Admitting: Family Medicine

## 2016-10-04 NOTE — Telephone Encounter (Signed)
Please call pt: - her labs are normal with the exception of VERY mild elevation in one of her liver enzymes. This can occur for many reasons and is not alarmig at this time, since so mild. - I do want her to use the Miralax for the stool burden found on xray, and taper down as her stools become daily and soft. Use the OTC Prilosec daily as well.  - follow up in 2 weeks, sooner if worsening. Would investigate further at that time if not improving with above regimen and repeat LFT.

## 2016-10-04 NOTE — Telephone Encounter (Signed)
Left message for patient to return call to review lab results. 

## 2016-10-05 NOTE — Telephone Encounter (Signed)
Spoke with patient reviewed lab results and instructions with patient. Patient verbalized understanding . 

## 2016-10-06 ENCOUNTER — Encounter: Payer: Self-pay | Admitting: Family Medicine

## 2016-10-11 ENCOUNTER — Ambulatory Visit (INDEPENDENT_AMBULATORY_CARE_PROVIDER_SITE_OTHER): Payer: 59 | Admitting: Family Medicine

## 2016-10-11 ENCOUNTER — Encounter: Payer: Self-pay | Admitting: Family Medicine

## 2016-10-11 VITALS — BP 109/75 | HR 73 | Temp 97.3°F | Resp 20 | Wt 147.8 lb

## 2016-10-11 DIAGNOSIS — N926 Irregular menstruation, unspecified: Secondary | ICD-10-CM

## 2016-10-11 DIAGNOSIS — R1084 Generalized abdominal pain: Secondary | ICD-10-CM

## 2016-10-11 DIAGNOSIS — R7989 Other specified abnormal findings of blood chemistry: Secondary | ICD-10-CM

## 2016-10-11 DIAGNOSIS — N941 Unspecified dyspareunia: Secondary | ICD-10-CM | POA: Diagnosis not present

## 2016-10-11 DIAGNOSIS — R945 Abnormal results of liver function studies: Secondary | ICD-10-CM

## 2016-10-11 NOTE — Progress Notes (Signed)
Patient ID: Jamie Miller, female  DOB: November 29, 1970, 46 y.o.   MRN: [id number] Patient Care Team    Relationship Specialty Notifications Start End  Ma Hillock, DO PCP - General Family Medicine  10/03/16     Subjective:  Jamie Miller is a 46 y.o.  female present for follow up on abd pain.   Abdominal pain: pt presents for f/u on lower abd pain. Abd xray obtained last week resulted with moderate stool burden no acute process for pain. Her labs resulted with normal CBC, CRP, H. Pylori, Urinalysis and CMP with the exception of mildly elevated ALT (43). She also had a negative pregnancy test. She reports she has been taking the 1 cap miralax daily, drinking 50 ounces of water daily, taking the daily PPI. She also takes a daily probiotic. She reports daily bowel movement. Today she has had loose stools. She is still endorsing cramps lower left abdomen, and sometimes right lower abdomen, belching and early satiety. She sometimes feels food improves symptoms and sometimes worsens. She denies history of IBS/IBD personally or in her family. She denies FH h/o colon cancer. She denies melana, hematochezia. She has gained 2.5 lbs since last visit. She reports a normal PAP smear in 2016, which was normal with negative HPV.  Of note: was able to view last GYN appt in care-everywhere in which the pt complained "At end of appt mentions that she had weird cycle changes this month with a prolonged period, cramping, and diarrhea yesterday. Offered to bring back for US/SIS/embx vs observing for a cycle or two and return if continued issues. She opts to observe"   Prior note: Pt presents today to establish and discuss her abdominal pain. She states Her discomfort started with left sided abdominal cramping last week. This has improved but is now a continued  dull ache. Her bowel movements were loose for 2 days surrounding the cramping, then she went a few days without a BM. She feels they have been normal for the last  2 days. She denies nausea, vomit, melena, hematochezia,  dysuria, fever or chills. She endorses late menses, increased belching and early satiety.  She reports her menstrual cycle is overdue, usually 21 days between cycles and was due beginning of December. Her husband had a vasectomy many years ago. She started to taking prilosec 2 days ago to help with symptoms and she thought it helped the first day only. She does note she started eating "healthier" a few days prior to the onset and had realized she was consuming more acidic foods and drinking moire coffee. She also tried metamucil and that was not effective.   Dg Abd 2 Views  Result Date: 10/03/2016 CLINICAL DATA:  Lower midline abdominal pain for 4 days EXAM: ABDOMEN - 2 VIEW COMPARISON:  None. FINDINGS: Moderate stool burden. There is normal bowel gas pattern. No free air. No organomegaly or suspicious calcification. No acute bony abnormality. Lung bases are clear. IMPRESSION: Moderate stool burden.  No acute findings. Electronically Signed   By: Rolm Baptise M.D.   On: 10/03/2016 13:35   Recent Results (from the past 2160 hour(s))  CBC w/Diff     Status: None   Collection Time: 10/03/16 10:44 AM  Result Value Ref Range   WBC 7.7 4.0 - 10.5 K/uL   RBC 4.55 3.87 - 5.11 Mil/uL   Hemoglobin 14.5 12.0 - 15.0 g/dL   HCT 42.6 36.0 - 46.0 %   MCV 93.5 78.0 -  100.0 fl   MCHC 34.1 30.0 - 36.0 g/dL   RDW 13.0 11.5 - 15.5 %   Platelets 230.0 150.0 - 400.0 K/uL   Neutrophils Relative % 71.6 43.0 - 77.0 %   Lymphocytes Relative 18.0 12.0 - 46.0 %   Monocytes Relative 8.6 3.0 - 12.0 %   Eosinophils Relative 1.1 0.0 - 5.0 %   Basophils Relative 0.7 0.0 - 3.0 %   Neutro Abs 5.5 1.4 - 7.7 K/uL   Lymphs Abs 1.4 0.7 - 4.0 K/uL   Monocytes Absolute 0.7 0.1 - 1.0 K/uL   Eosinophils Absolute 0.1 0.0 - 0.7 K/uL   Basophils Absolute 0.1 0.0 - 0.1 K/uL  Comp Met (CMET)     Status: Abnormal   Collection Time: 10/03/16 10:44 AM  Result Value Ref Range    Sodium 138 135 - 145 mEq/L   Potassium 4.6 3.5 - 5.1 mEq/L   Chloride 103 96 - 112 mEq/L   CO2 30 19 - 32 mEq/L   Glucose, Bld 100 (H) 70 - 99 mg/dL   BUN 13 6 - 23 mg/dL   Creatinine, Ser 0.72 0.40 - 1.20 mg/dL   Total Bilirubin 0.5 0.2 - 1.2 mg/dL   Alkaline Phosphatase 77 39 - 117 U/L   AST 24 0 - 37 U/L   ALT 43 (H) 0 - 35 U/L   Total Protein 7.0 6.0 - 8.3 g/dL   Albumin 4.2 3.5 - 5.2 g/dL   Calcium 9.3 8.4 - 10.5 mg/dL   GFR 92.95 >60.00 mL/min  Lipase     Status: None   Collection Time: 10/03/16 10:44 AM  Result Value Ref Range   Lipase 40.0 11.0 - 59.0 U/L  H. pylori antibody, IgG     Status: None   Collection Time: 10/03/16 10:44 AM  Result Value Ref Range   H Pylori IgG Negative Negative  C-reactive protein     Status: Abnormal   Collection Time: 10/03/16 10:44 AM  Result Value Ref Range   CRP 0.3 (L) 0.5 - 20.0 mg/dL  Urinalysis, Routine w reflex microscopic     Status: Abnormal   Collection Time: 10/03/16 10:44 AM  Result Value Ref Range   Color, Urine YELLOW Yellow;Lt. Yellow   APPearance CLEAR Clear   Specific Gravity, Urine <=1.005 (A) 1.000 - 1.030   pH 6.0 5.0 - 8.0   Total Protein, Urine NEGATIVE Negative   Urine Glucose NEGATIVE Negative   Ketones, ur NEGATIVE Negative   Bilirubin Urine NEGATIVE Negative   Hgb urine dipstick NEGATIVE Negative   Urobilinogen, UA 0.2 0.0 - 1.0   Leukocytes, UA NEGATIVE Negative   Nitrite NEGATIVE Negative   Squamous Epithelial / LPF Rare(0-4/hpf) Rare(0-4/hpf)  POCT urine pregnancy     Status: Normal   Collection Time: 10/03/16 10:49 AM  Result Value Ref Range   Preg Test, Ur Negative Negative      There is no immunization history on file for this patient.   Past Medical History:  Diagnosis Date  . Allergy   . Anxiety   . Chicken pox   . Thyroid disease    was on low dose synthroid at one time, but removed and levels have been "normal" since and followed thorugh GYN   Allergies  Allergen Reactions  .  Dairy Aid  [Lactase] Rash  . Eggs Or Egg-Derived Products Rash  . Hemp Seed Oil  [Dronabinol] Rash    itching  . Levothyroxine     Other reaction(s): Other  Tingling in legs; heart palpitation  . Metronidazole     Other reaction(s): Other Cramps  . Nitrofurantoin Monohyd Macro Diarrhea    Other reaction(s): Other Cramps  . Strawberry (Diagnostic) Rash    itching  . Wheat Bran Rash  . Almond Oil   . Keflex [Cephalexin]   . Nickel   . Other     Sensitive to all abx   . Clindamycin     Other reaction(s): Abdominal Pain  . Cranberry     Other reaction(s): Abdominal Pain   Past Surgical History:  Procedure Laterality Date  . APPENDECTOMY  1990  . BREAST BIOPSY  2000   biopsy  . CERVICAL CERCLAGE  2009  . HYMENECTOMY  2005   Family History  Problem Relation Age of Onset  . Hyperlipidemia Mother   . Diabetes Father   . Prostate cancer Father     prostate  . Prostate cancer Maternal Grandfather     prostate  . Prostate cancer Paternal Grandfather     prostate  . Arthritis Maternal Grandmother    Social History   Social History  . Marital status: Married    Spouse name: Richardson Landry  . Number of children: 1  . Years of education: N/A   Occupational History  . Not on file.   Social History Main Topics  . Smoking status: Never Smoker  . Smokeless tobacco: Never Used  . Alcohol use No  . Drug use: No  . Sexual activity: Yes    Partners: Male    Birth control/ protection: Surgical     Comment: married   Other Topics Concern  . Not on file   Social History Narrative   Married to [place]. Has a daughter Colletta [place].    College degree.    Drinks caffeine. Uses herbal remedies. Takes a daily vitamin.    Wears her seatbelt   Smoke detector in the home.    Feels safe in her relationships.    Allergies as of 10/11/2016      Reactions   Dairy Aid  [lactase] Rash   Eggs Or Egg-derived Products Rash   Hemp Seed Oil  [dronabinol] Rash   itching   Levothyroxine    Other  reaction(s): Other Tingling in legs; heart palpitation   Metronidazole    Other reaction(s): Other Cramps   Nitrofurantoin Monohyd Macro Diarrhea   Other reaction(s): Other Cramps   Strawberry (diagnostic) Rash   itching   Wheat Bran Rash   Almond Oil    Keflex [cephalexin]    Nickel    Other    Sensitive to all abx    Clindamycin    Other reaction(s): Abdominal Pain   Cranberry    Other reaction(s): Abdominal Pain      Medication List       Accurate as of 10/11/16  9:42 AM. Always use your most recent med list.          Acidophilus/Goat Milk Caps Take by mouth.   Boric Acid 4 % Soln Place vaginally. Reported on 01/27/2016   famotidine 10 MG chewable tablet Commonly known as:  PEPCID AC Chew 10 mg by mouth 2 (two) times daily.   Loratadine 10 MG Caps Take 10 mg by mouth as needed. Reported on 01/27/2016   MULTIPLE VITAMIN PO Take 1 tablet by mouth daily.   polyethylene glycol packet Commonly known as:  MIRALAX / GLYCOLAX Take 17 g by mouth daily.        Recent Results (from  the past 2160 hour(s))  CBC w/Diff     Status: None   Collection Time: 10/03/16 10:44 AM  Result Value Ref Range   WBC 7.7 4.0 - 10.5 K/uL   RBC 4.55 3.87 - 5.11 Mil/uL   Hemoglobin 14.5 12.0 - 15.0 g/dL   HCT 42.6 36.0 - 46.0 %   MCV 93.5 78.0 - 100.0 fl   MCHC 34.1 30.0 - 36.0 g/dL   RDW 13.0 11.5 - 15.5 %   Platelets 230.0 150.0 - 400.0 K/uL   Neutrophils Relative % 71.6 43.0 - 77.0 %   Lymphocytes Relative 18.0 12.0 - 46.0 %   Monocytes Relative 8.6 3.0 - 12.0 %   Eosinophils Relative 1.1 0.0 - 5.0 %   Basophils Relative 0.7 0.0 - 3.0 %   Neutro Abs 5.5 1.4 - 7.7 K/uL   Lymphs Abs 1.4 0.7 - 4.0 K/uL   Monocytes Absolute 0.7 0.1 - 1.0 K/uL   Eosinophils Absolute 0.1 0.0 - 0.7 K/uL   Basophils Absolute 0.1 0.0 - 0.1 K/uL  Comp Met (CMET)     Status: Abnormal   Collection Time: 10/03/16 10:44 AM  Result Value Ref Range   Sodium 138 135 - 145 mEq/L   Potassium 4.6 3.5 -  5.1 mEq/L   Chloride 103 96 - 112 mEq/L   CO2 30 19 - 32 mEq/L   Glucose, Bld 100 (H) 70 - 99 mg/dL   BUN 13 6 - 23 mg/dL   Creatinine, Ser 0.72 0.40 - 1.20 mg/dL   Total Bilirubin 0.5 0.2 - 1.2 mg/dL   Alkaline Phosphatase 77 39 - 117 U/L   AST 24 0 - 37 U/L   ALT 43 (H) 0 - 35 U/L   Total Protein 7.0 6.0 - 8.3 g/dL   Albumin 4.2 3.5 - 5.2 g/dL   Calcium 9.3 8.4 - 10.5 mg/dL   GFR 92.95 >60.00 mL/min  Lipase     Status: None   Collection Time: 10/03/16 10:44 AM  Result Value Ref Range   Lipase 40.0 11.0 - 59.0 U/L  H. pylori antibody, IgG     Status: None   Collection Time: 10/03/16 10:44 AM  Result Value Ref Range   H Pylori IgG Negative Negative  C-reactive protein     Status: Abnormal   Collection Time: 10/03/16 10:44 AM  Result Value Ref Range   CRP 0.3 (L) 0.5 - 20.0 mg/dL  Urinalysis, Routine w reflex microscopic     Status: Abnormal   Collection Time: 10/03/16 10:44 AM  Result Value Ref Range   Color, Urine YELLOW Yellow;Lt. Yellow   APPearance CLEAR Clear   Specific Gravity, Urine <=1.005 (A) 1.000 - 1.030   pH 6.0 5.0 - 8.0   Total Protein, Urine NEGATIVE Negative   Urine Glucose NEGATIVE Negative   Ketones, ur NEGATIVE Negative   Bilirubin Urine NEGATIVE Negative   Hgb urine dipstick NEGATIVE Negative   Urobilinogen, UA 0.2 0.0 - 1.0   Leukocytes, UA NEGATIVE Negative   Nitrite NEGATIVE Negative   Squamous Epithelial / LPF Rare(0-4/hpf) Rare(0-4/hpf)  POCT urine pregnancy     Status: Normal   Collection Time: 10/03/16 10:49 AM  Result Value Ref Range   Preg Test, Ur Negative Negative    ROS: 14 pt review of systems performed and negative (unless mentioned in an HPI)  Objective: BP 109/75 (BP Location: Left Arm, Patient Position: Sitting, Cuff Size: Normal)   Pulse 73   Temp 97.3 F (36.3 C)  Resp 20   Wt 147 lb 12 oz (67 kg)   SpO2 99%   BMI 25.76 kg/m   Gen: Afebrile. No acute distress. Extremely anxious. Tearful from anxiety.  HENT: AT. [place].   MMM.  Eyes:Pupils Equal Round Reactive to light, Extraocular movements intact,  Conjunctiva without redness, discharge or icterus. CV: RRR, no edema, +2/4 P posterior tibialis pulses Chest: CTAB, no wheeze or crackles Abd: Soft. Lower midline still not soft. flat. ND. Moderate TTP lower abdomen. BS present.  Skin: no rashes, purpura or petechiae.  Neuro: Normal gait. PERLA. EOMi. Alert. Oriented.       Assessment/plan: SEMAJ KHAM is a 46 y.o. female present for abd discomfort.  Lower abdominal pain - still uncertain etiology of her discomfort at this time.  - continue water, probiotic, prilosec and taper back miralax until her stools are routine, soft but not diarrhea.  - Despite reports of bowel regulation, she still has abnormal feel and tenderness to her lower abdomen.  - She can attempt to slowly add her regular diet back to try to identify triggers (had been on a bland diet).  - Discussed mildly elevated ALT and suggest she have labs repeated in 1 month by lab appt only.  - Pt appeared tender on exam, abnormal exam and she is extremely anxious/tearful. Discussed CT imaging of abdomen since her symptoms have not improved and she is agreeable. Hopefully that will provide more answers.  - Depending on results will refer to specialist.  - if pain worsens she is to be seen in ED immediately   F/U dependent on Ct results.   Electronically signed by: Howard Pouch, DO [place]

## 2016-10-11 NOTE — Patient Instructions (Signed)
Miralax taper back to least amount that is affective. Repeat blood work 1 month.  Continue pepcid.  I will order a CT of abdomen, they will call you to schedule.  If pain worsens please be seen in ED.

## 2016-10-12 ENCOUNTER — Telehealth: Payer: Self-pay | Admitting: Family Medicine

## 2016-10-12 NOTE — Telephone Encounter (Signed)
Pattient called to advise that she has begun having flu symptoms today and that her daughter was dx yesterday with it. She didn't know if she hsould be following the plan of care that she was given at her appt yesterday or if she should come in to be seen. Please advise patient.

## 2016-10-13 ENCOUNTER — Encounter: Payer: Self-pay | Admitting: Family Medicine

## 2016-10-13 ENCOUNTER — Telehealth: Payer: Self-pay | Admitting: *Deleted

## 2016-10-13 MED ORDER — OSELTAMIVIR PHOSPHATE 75 MG PO CAPS: 75.0000 mg | ORAL_CAPSULE | Freq: Two times a day (BID) | ORAL | 0 refills | Status: DC

## 2016-10-13 NOTE — Telephone Encounter (Signed)
Message sent to patient in MY Chart 

## 2016-10-13 NOTE — Telephone Encounter (Signed)
Patient sent message in My Chart stating her daughter has been Dx with the flu now patient is having symptoms. Tamiflu Rx sent to patient pharmacy per Dr Raoul Pitch. Sent patient message back in my Chart.

## 2016-10-25 ENCOUNTER — Encounter (HOSPITAL_BASED_OUTPATIENT_CLINIC_OR_DEPARTMENT_OTHER): Payer: Self-pay

## 2016-10-25 ENCOUNTER — Telehealth: Payer: Self-pay | Admitting: Family Medicine

## 2016-10-25 ENCOUNTER — Ambulatory Visit (HOSPITAL_BASED_OUTPATIENT_CLINIC_OR_DEPARTMENT_OTHER)
Admission: RE | Admit: 2016-10-25 | Discharge: 2016-10-25 | Disposition: A | Discharge status: Home / Self Care | Payer: 59 | Source: Ambulatory Visit | Attending: Family Medicine | Admitting: Family Medicine

## 2016-10-25 DIAGNOSIS — N926 Irregular menstruation, unspecified: Secondary | ICD-10-CM | POA: Diagnosis not present

## 2016-10-25 DIAGNOSIS — N941 Unspecified dyspareunia: Secondary | ICD-10-CM | POA: Insufficient documentation

## 2016-10-25 DIAGNOSIS — R1084 Generalized abdominal pain: Secondary | ICD-10-CM | POA: Diagnosis present

## 2016-10-25 DIAGNOSIS — R7989 Other specified abnormal findings of blood chemistry: Secondary | ICD-10-CM | POA: Insufficient documentation

## 2016-10-25 DIAGNOSIS — R109 Unspecified abdominal pain: Secondary | ICD-10-CM | POA: Diagnosis not present

## 2016-10-25 DIAGNOSIS — R945 Abnormal results of liver function studies: Secondary | ICD-10-CM

## 2016-10-25 HISTORY — DX: Unspecified asthma, uncomplicated: J45.909

## 2016-10-25 MED ORDER — IOPAMIDOL (ISOVUE-300) INJECTION 61%
100.0000 mL | Freq: Once | INTRAVENOUS | Status: AC | PRN
  Administered 2016-10-25: 100 mL via INTRAVENOUS

## 2016-10-25 NOTE — Telephone Encounter (Signed)
Please call pt: - her CT scan is normal. It did show moderate stool burden as well. All studies suggest this is irritable bowel like symptoms with constipation. She should continue the miralax and probiotic to have soft stools (but not diarrhea). If she is still having symptoms with no improvement will refer to GI. Please advise.

## 2016-10-26 ENCOUNTER — Encounter: Payer: Self-pay | Admitting: Family Medicine

## 2016-10-26 NOTE — Telephone Encounter (Signed)
Spoke with patient reviewed CT scan results and instructions. Patient will call back if no improvement so we can refer to GI.

## 2016-10-27 ENCOUNTER — Encounter: Payer: Self-pay | Admitting: Family Medicine

## 2016-11-08 ENCOUNTER — Other Ambulatory Visit (INDEPENDENT_AMBULATORY_CARE_PROVIDER_SITE_OTHER): Payer: 59

## 2016-11-08 DIAGNOSIS — R7989 Other specified abnormal findings of blood chemistry: Secondary | ICD-10-CM | POA: Diagnosis not present

## 2016-11-08 DIAGNOSIS — R945 Abnormal results of liver function studies: Principal | ICD-10-CM

## 2016-11-08 LAB — COMPREHENSIVE METABOLIC PANEL
ALBUMIN: 3.9 g/dL (ref 3.5–5.2)
ALK PHOS: 58 U/L (ref 39–117)
ALT: 23 U/L (ref 0–35)
AST: 22 U/L (ref 0–37)
BUN: 11 mg/dL (ref 6–23)
CALCIUM: 9.1 mg/dL (ref 8.4–10.5)
CHLORIDE: 104 meq/L (ref 96–112)
CO2: 29 mEq/L (ref 19–32)
Creatinine, Ser: 0.58 mg/dL (ref 0.40–1.20)
GFR: 119.24 mL/min (ref >60.00)
Glucose, Bld: 79 mg/dL (ref 70–99)
POTASSIUM: 4.2 meq/L (ref 3.5–5.1)
Sodium: 139 mEq/L (ref 135–145)
TOTAL PROTEIN: 6.6 g/dL (ref 6.0–8.3)
Total Bilirubin: 0.7 mg/dL (ref 0.2–1.2)

## 2016-11-09 ENCOUNTER — Telehealth: Payer: Self-pay | Admitting: Family Medicine

## 2016-11-09 ENCOUNTER — Encounter: Payer: Self-pay | Admitting: *Deleted

## 2016-11-09 NOTE — Telephone Encounter (Signed)
Sent message to patient in My Chart labs normal

## 2016-11-09 NOTE — Telephone Encounter (Signed)
Please call pt: - her labs all returned to normal.

## 2016-11-17 ENCOUNTER — Encounter: Payer: Self-pay | Admitting: Obstetrics & Gynecology

## 2016-11-27 ENCOUNTER — Encounter: Payer: Self-pay | Admitting: Obstetrics & Gynecology

## 2016-11-27 ENCOUNTER — Ambulatory Visit (INDEPENDENT_AMBULATORY_CARE_PROVIDER_SITE_OTHER): Payer: 59 | Admitting: Obstetrics & Gynecology

## 2016-11-27 VITALS — BP 109/68 | HR 90 | Resp 16 | Ht 64.0 in | Wt 147.0 lb

## 2016-11-27 DIAGNOSIS — Z113 Encounter for screening for infections with a predominantly sexual mode of transmission: Secondary | ICD-10-CM | POA: Diagnosis not present

## 2016-11-27 DIAGNOSIS — N938 Other specified abnormal uterine and vaginal bleeding: Secondary | ICD-10-CM

## 2016-11-27 NOTE — Progress Notes (Signed)
   Subjective:    Patient ID: Jamie Miller, female    DOB: Dec 20, 1970, 46 y.o.   MRN: [id number]  HPI  46 yo P13 (9yo daughter) here with the complaint of skipping a period in January followed by a normal period in Feb and then she has bled since March 7 (20 days). It is light.   Review of Systems     Objective:   Physical Exam [id number] Breathing, conversing, and ambulating normally She makes pain faces even when the speculum is just outside the vagina, appears to have great pain with a speculum No masses appreciated with bimanual exam but exam is limited by voluntary guarding       Assessment & Plan:  DUB- check TSH, CBC, cervical cultures, and gyn u/s UTI symptoms- UA negative Rec more daily water intake

## 2016-11-28 ENCOUNTER — Ambulatory Visit (INDEPENDENT_AMBULATORY_CARE_PROVIDER_SITE_OTHER): Payer: 59

## 2016-11-28 DIAGNOSIS — N938 Other specified abnormal uterine and vaginal bleeding: Secondary | ICD-10-CM

## 2016-11-28 DIAGNOSIS — D251 Intramural leiomyoma of uterus: Secondary | ICD-10-CM

## 2016-11-28 LAB — CBC
HCT: 40.1 % (ref 35.0–45.0)
HEMOGLOBIN: 13.1 g/dL (ref 11.7–15.5)
MCH: 31.3 pg (ref 27.0–33.0)
MCHC: 32.7 g/dL (ref 32.0–36.0)
MCV: 95.7 fL (ref 80.0–100.0)
MPV: 10.1 fL (ref 7.5–12.5)
Platelets: 306 10*3/uL (ref 140–400)
RBC: 4.19 MIL/uL (ref 3.80–5.10)
RDW: 13.7 % (ref 11.0–15.0)
WBC: 8.3 10*3/uL (ref 3.8–10.8)

## 2016-11-28 LAB — TSH: TSH: 3.21 m[IU]/L

## 2016-11-30 ENCOUNTER — Encounter: Payer: Self-pay | Admitting: Obstetrics & Gynecology

## 2016-11-30 LAB — GC/CHLAMYDIA PROBE AMP (~~LOC~~) NOT AT ARMC
Chlamydia: NEGATIVE
Neisseria Gonorrhea: NEGATIVE

## 2016-12-04 ENCOUNTER — Ambulatory Visit: Payer: 59 | Admitting: Obstetrics & Gynecology

## 2016-12-05 ENCOUNTER — Encounter: Payer: Self-pay | Admitting: Obstetrics & Gynecology

## 2016-12-07 ENCOUNTER — Encounter: Payer: Self-pay | Admitting: Obstetrics & Gynecology

## 2016-12-11 ENCOUNTER — Ambulatory Visit (INDEPENDENT_AMBULATORY_CARE_PROVIDER_SITE_OTHER): Payer: 59 | Admitting: Obstetrics & Gynecology

## 2016-12-11 ENCOUNTER — Encounter: Payer: Self-pay | Admitting: Obstetrics & Gynecology

## 2016-12-11 ENCOUNTER — Inpatient Hospital Stay (HOSPITAL_COMMUNITY): Admission: RE | Admit: 2016-12-11 | Payer: Self-pay | Source: Ambulatory Visit

## 2016-12-11 VITALS — BP 106/68 | HR 86 | Resp 16 | Ht 64.0 in | Wt 146.0 lb

## 2016-12-11 DIAGNOSIS — Z812 Family history of tobacco abuse and dependence: Secondary | ICD-10-CM

## 2016-12-11 DIAGNOSIS — N938 Other specified abnormal uterine and vaginal bleeding: Secondary | ICD-10-CM | POA: Diagnosis not present

## 2016-12-11 DIAGNOSIS — Z124 Encounter for screening for malignant neoplasm of cervix: Secondary | ICD-10-CM

## 2016-12-11 DIAGNOSIS — Z0000 Encounter for general adult medical examination without abnormal findings: Secondary | ICD-10-CM

## 2016-12-11 DIAGNOSIS — Z1151 Encounter for screening for human papillomavirus (HPV): Secondary | ICD-10-CM | POA: Diagnosis not present

## 2016-12-11 NOTE — Progress Notes (Signed)
   Subjective:    Patient ID: Jamie Miller, female    DOB: [birth year], 46 y.o.   MRN: [id number]  HPI 46 yo P1 here for results of w/u of DUB. U/S, cultures, CBC, TSH all normal.   Review of Systems     Objective:   Physical Exam [id number] Vulva with moderate atrophy 2nd uterine prolapse Pap smear obtained    Assessment & Plan:  Perimenopause- offered watchful waiting, OCPs, endometrial ablation for this. At this time she would like [employer]. She will schedule a mammogram Pap smear with cotesting today

## 2016-12-13 LAB — CYTOLOGY - PAP
DIAGNOSIS: NEGATIVE
HPV: NOT DETECTED

## 2017-01-18 ENCOUNTER — Encounter: Payer: Self-pay | Admitting: Obstetrics & Gynecology

## 2017-01-19 ENCOUNTER — Ambulatory Visit (INDEPENDENT_AMBULATORY_CARE_PROVIDER_SITE_OTHER): Payer: 59 | Admitting: Obstetrics & Gynecology

## 2017-01-19 ENCOUNTER — Encounter: Payer: Self-pay | Admitting: Obstetrics & Gynecology

## 2017-01-19 VITALS — BP 112/68 | HR 106 | Resp 20 | Ht 63.5 in | Wt 144.4 lb

## 2017-01-19 DIAGNOSIS — Z8639 Personal history of other endocrine, nutritional and metabolic disease: Secondary | ICD-10-CM

## 2017-01-19 DIAGNOSIS — N920 Excessive and frequent menstruation with regular cycle: Secondary | ICD-10-CM | POA: Diagnosis not present

## 2017-01-19 DIAGNOSIS — D251 Intramural leiomyoma of uterus: Secondary | ICD-10-CM | POA: Diagnosis not present

## 2017-01-19 DIAGNOSIS — N921 Excessive and frequent menstruation with irregular cycle: Secondary | ICD-10-CM

## 2017-01-19 LAB — CBC
HCT: 43.2 % (ref 35.0–45.0)
Hemoglobin: 14.1 g/dL (ref 11.7–15.5)
MCH: 31.2 pg (ref 27.0–33.0)
MCHC: 32.6 g/dL (ref 32.0–36.0)
MCV: 95.6 fL (ref 80.0–100.0)
MPV: 10.5 fL (ref 7.5–12.5)
Platelets: 285 10*3/uL (ref 140–400)
RBC: 4.52 MIL/uL (ref 3.80–5.10)
RDW: 13.3 % (ref 11.0–15.0)
WBC: 7 10*3/uL (ref 3.8–10.8)

## 2017-01-19 MED ORDER — NORETHINDRONE ACETATE 5 MG PO TABS: 5.0000 mg | ORAL_TABLET | Freq: Every day | ORAL | 0 refills | Status: DC

## 2017-01-19 NOTE — Progress Notes (Signed)
46 y.o. [id number] MarriedCaucasianF here for heavy bleeding and severe cramping with menses.  Patient reports that her cycles have been worsening over the past several months.  She's had a cycle that lasted close to 30 days and with each cycle, she does have some days that are heavy.  On these days, she uses a maxi pad with wings and changes every 4 hours.  This current cycle started on 5/11 and was light to start off with.  Yesterday this changed and she is changing pads every 1 - 1 1/2 hours.  She is cramping a lot and passing some small clots.  She's been followed by another ob/gyn but could not get anyone to return her phone calls and came in for second opinion regarding her care.  Ultrasound was performed 3/18 showing 6 x 3 x 4cm uterus with anterior fundal intramural fibroid measuring 31mm.  Normal ovaries.  86mm endometrium.  Patient's last menstrual period was 01/12/2017.          Sexually active: Yes.    The current method of family planning is vasectomy.    Exercising: No.  The patient does not participate in regular exercise at present. Smoker:  no  Health Maintenance: Pap:  12/11/16 Neg, neg HR HPV History of abnormal Pap:  Yes, in college. Repeated and was normal.  MMG: 08/21/15 BIRADS2, [facility] Colonoscopy:  Never BMD:   Never TDaP:  04/13/2008 Hep C testing: Never Screening Labs: PCP    reports that she has never smoked. She has never used smokeless tobacco. She reports that she does not drink alcohol or use drugs.  Past Medical History:  Diagnosis Date  . Allergy   . Anxiety   . Asthma   . Chicken pox   . OAB (overactive bladder)   . Thyroid disease    was on low dose synthroid at one time, but removed and levels have been "normal" since and followed thorugh GYN  . Vitamin D deficiency     Past Surgical History:  Procedure Laterality Date  . APPENDECTOMY  1990  . BREAST BIOPSY  2000   biopsy  . CERVICAL CERCLAGE  2009  . HYMENECTOMY  2005  . WISDOM TOOTH  EXTRACTION      Current Outpatient Prescriptions  Medication Sig Dispense Refill  . Boric Acid 4 % SOLN Place vaginally. Reported on 01/27/2016    . cholecalciferol (VITAMIN D) 1000 units tablet Take 2,000 Units by mouth daily.    . Loratadine 10 MG CAPS Take 10 mg by mouth as needed. Reported on 01/27/2016    . MULTIPLE VITAMIN PO Take 1 tablet by mouth daily.    . polyethylene glycol (MIRALAX / GLYCOLAX) packet Take 17 g by mouth daily.    . Probiotic Product (ACIDOPHILUS/GOAT MILK) CAPS Take by mouth.    . vitamin E 200 UNIT capsule Take 200 Units by mouth daily.     No current facility-administered medications for this visit.     Family History  Problem Relation Age of Onset  . Hyperlipidemia Mother   . Diabetes Father   . Prostate cancer Father        prostate  . Prostate cancer Maternal Grandfather        prostate  . Prostate cancer Paternal Grandfather        prostate  . Arthritis Maternal Grandmother   . Lung cancer Paternal Grandmother     ROS:  Pertinent items are noted in HPI.  Otherwise, a comprehensive ROS was  negative.  Exam:   BP 112/68 (BP Location: Right Arm, Patient Position: Sitting, Cuff Size: Normal)   Pulse (!) 106   Resp 20   Ht 5' 3.5" (1.613 m)   Wt 144 lb 6.4 oz (65.5 kg)   LMP 01/12/2017   BMI 25.18 kg/m     Height: 5' 3.5" (161.3 cm)  Ht Readings from Last 3 Encounters:  01/19/17 5' 3.5" (1.613 m)  12/11/16 5\' 4"  (1.626 m)  11/27/16 5\' 4"  (1.626 m)    General appearance: alert, cooperative and appears stated age Head: Normocephalic, without obvious abnormality, atraumatic Neck: no adenopathy, supple, symmetrical, trachea midline and thyroid normal to inspection and palpation Lungs: clear to auscultation bilaterally Heart: regular rate and rhythm Abdomen: soft, non-tender; bowel sounds normal; no masses,  no organomegaly Extremities: extremities normal, atraumatic, no cyanosis or edema Skin: Skin color, texture, turgor normal. No rashes  or lesions Lymph nodes: Cervical, supraclavicular, and axillary nodes normal. No abnormal inguinal nodes palpated Neurologic: Grossly normal  Pelvic: External genitalia:  no lesions              Urethra:  normal appearing urethra with no masses, tenderness or lesions              Bartholins and Skenes: normal                 Vagina: normal appearing vagina with normal color and discharge, no lesions              Cervix: no lesions              Pap taken: No. Bimanual Exam:  Uterus:  normal size, contour, position, consistency, mobility, non-tender              Adnexa: normal adnexa and no mass, fullness, tenderness               Rectovaginal: Confirms               Anus:  normal sphincter tone, no lesions  Endometrial biopsy recommended.  Discussed with patient.  Verbal and written consent obtained.   Procedure:  Speculum placed.  Cervix visualized and cleansed with betadine prep.  A single toothed tenaculum was applied to the anterior lip of the cervix.  Endometrial pipelle was advanced through the cervix into the endometrial cavity without difficulty.  Pipelle passed to 7.5cm.  Suction applied and pipelle removed with good tissue sample obtained.  Tenculum removed.  No bleeding noted.  Patient had a lot of cramping with biopsy.  Cramping significantly improved after pipelle removed.    Chaperone was present for exam.  A:  Menorrhagia Small uterine fibroid  P:   Endometrial biopsy pending.  Results and additional recommendations will be made to pt.  She and I discussed options for control of bleeding including OCPs (which she declines), micronor, Depo Provera (which she declines), progesterone IUD, endometrial ablation, and hysterectomy.  Information given.  She is interested in micronor at this time. Aygestin 5mg  bid until bleeding stops and then decrease to daily.  Rx sent to pharmacy. CBC obtained today

## 2017-01-22 ENCOUNTER — Encounter: Payer: 59 | Admitting: Obstetrics & Gynecology

## 2017-01-22 ENCOUNTER — Encounter: Payer: Self-pay | Admitting: Obstetrics & Gynecology

## 2017-01-22 DIAGNOSIS — H5213 Myopia, bilateral: Secondary | ICD-10-CM | POA: Diagnosis not present

## 2017-01-22 DIAGNOSIS — H524 Presbyopia: Secondary | ICD-10-CM | POA: Diagnosis not present

## 2017-01-22 DIAGNOSIS — H52223 Regular astigmatism, bilateral: Secondary | ICD-10-CM | POA: Diagnosis not present

## 2017-01-25 ENCOUNTER — Telehealth: Payer: Self-pay

## 2017-01-25 MED ORDER — NORETHINDRONE 0.35 MG PO TABS: 1.0000 | ORAL_TABLET | Freq: Every day | ORAL | 0 refills | Status: DC

## 2017-01-25 NOTE — Telephone Encounter (Signed)
-----   Message from Megan Salon, MD sent at 01/24/2017 12:23 AM EDT ----- Please let pt know her pathology showed benign tissue.  She was given aygestin 5mg  bid to control her bleeding.  If she is not bleeding, she should now be on aygestin 5mg  daily.  She can take this until she is out of the prescription.  We discussed a trial of Micronor before doing anything more invasive.  If she is still her desire, she can transition to the Micronor after she has finished with the Aygestin.  Recheck 3 months.  Micronor rx will need to be called into pharmacy.

## 2017-01-25 NOTE — Telephone Encounter (Signed)
Spoke with patient. Advised of message and results as seen below from [place]. Patient is agreeable and verbalizes understanding. Patient is taking Aygestin once per day at this time and is not having any bleeding. Will continue taking Aygestin until she is out of the prescription will then start Micronor. Rx for Micronor #3 [id number] sent to pharmacy on file for patient to pick up when she is close to the end of taking Aygestin. Will contact the office when she starts Micronor to schedule 3 month recheck.  Routing to provider for final review. Patient agreeable to disposition. Will close encounter.

## 2017-01-26 ENCOUNTER — Telehealth: Payer: Self-pay | Admitting: Obstetrics & Gynecology

## 2017-01-26 NOTE — Telephone Encounter (Signed)
Spoke with patient. Patient states that she woke up this morning and is having light spotting and mild cramping. Has been taking Aygestin 5 mg. Started with taking 2 per day and decreased to 1 per day a few days ago when bleeding stopped. Denies any heavy bleeding today. Advised will review with Dr.Miller and return call with further recommendations. Patient is agreeable.

## 2017-01-26 NOTE — Telephone Encounter (Signed)
Spoke with patient. Advised of message as seen below from [place]. Patient is agreeable and verbalizes understanding. Will start Micronor tomorrow. Aware she will need to take Micronor at the same time daily and not miss any pills. May have off and on spotting this month as she adjusts to the new pill. Patient is agreeable.6 week recheck scheduled for 03/12/2017 at 9:15 am with Dr.Miller. Patient is agreeable to date and time.  Routing to provider for final review. Patient agreeable to disposition. Will close encounter.

## 2017-01-26 NOTE — Telephone Encounter (Signed)
Ok to switch to the microno now.  She may spot off and on this month while she is transitioning to this medication.  Follow up was scheduled in 3 months.  Change this please to six weeks.  She can stop the aygestin.  If bleeding becomes heavier, have her call back please.

## 2017-01-26 NOTE — Telephone Encounter (Signed)
Patient states that bleeding has started again just now.  States that it is very light and having some cramping.

## 2017-01-29 ENCOUNTER — Encounter: Payer: Self-pay | Admitting: Obstetrics & Gynecology

## 2017-02-06 ENCOUNTER — Encounter: Payer: Self-pay | Admitting: Family Medicine

## 2017-02-15 ENCOUNTER — Other Ambulatory Visit: Payer: Self-pay | Admitting: Obstetrics & Gynecology

## 2017-02-15 NOTE — Telephone Encounter (Signed)
Medication refill request: AYGESTIN 5mg  Last OV:  01/19/17 Menorrhagia w/ irregular cycle -- SM Next AEX: none scheduled Last MMG (if hormonal medication request): 08/21/15 [id number], [facility] Refill authorized: 01/19/17 #40 w/0 refills; please advise if patient needs refill

## 2017-02-15 NOTE — Telephone Encounter (Signed)
Pt is now on Micronor so should not need refill.  Ok to decline.

## 2017-02-16 ENCOUNTER — Encounter: Payer: Self-pay | Admitting: Obstetrics & Gynecology

## 2017-02-20 ENCOUNTER — Other Ambulatory Visit: Payer: Self-pay | Admitting: Obstetrics & Gynecology

## 2017-02-20 MED ORDER — NORETHINDRONE 0.35 MG PO TABS: 1.0000 | ORAL_TABLET | Freq: Every day | ORAL | 0 refills | Status: DC

## 2017-03-01 ENCOUNTER — Encounter: Payer: Self-pay | Admitting: Obstetrics & Gynecology

## 2017-03-02 ENCOUNTER — Encounter: Payer: Self-pay | Admitting: Obstetrics & Gynecology

## 2017-03-02 ENCOUNTER — Telehealth: Payer: Self-pay | Admitting: *Deleted

## 2017-03-02 NOTE — Telephone Encounter (Signed)
Spoke with patient. Patient previously scheduled for 6 week f/u on 7/9, moved appointment to 03/08/17 at 1:45pm. Patient declined appointment offered for 7/3. Patient agreeable to date and time.  Routing to provider for final review. Patient is agreeable to disposition. Will close encounter.

## 2017-03-02 NOTE — Telephone Encounter (Signed)
-----   Message from Megan Salon, MD sent at 03/02/2017  8:19 AM EDT ----- Regarding: pt needs appt This pt is sending a message about her bleeding every two to three days, it seems.  She needs an OV.  Please call her and schedule her for follow-up.  Thanks.  Vinnie Level

## 2017-03-08 ENCOUNTER — Encounter: Payer: Self-pay | Admitting: Obstetrics & Gynecology

## 2017-03-08 ENCOUNTER — Ambulatory Visit (INDEPENDENT_AMBULATORY_CARE_PROVIDER_SITE_OTHER): Payer: 59 | Admitting: Obstetrics & Gynecology

## 2017-03-08 VITALS — BP 112/62 | HR 106 | Resp 16 | Ht 64.0 in | Wt 144.5 lb

## 2017-03-08 DIAGNOSIS — N921 Excessive and frequent menstruation with irregular cycle: Secondary | ICD-10-CM | POA: Diagnosis not present

## 2017-03-08 DIAGNOSIS — D251 Intramural leiomyoma of uterus: Secondary | ICD-10-CM

## 2017-03-11 NOTE — Progress Notes (Signed)
GYNECOLOGY  VISIT   HPI: 46 y.o. G36P0101 Married Caucasian female here for follow up after starting micronor for heavy and irregular bleeding.  Pt has send multiple mychart messages and it's been a little difficult to determine whether she feels this is actually helping.  So, she was scheduled for follow-up visit today.  Pt reports bleeding is much better.  She's had no actual heavy days since starting the micronor.  She has experienced some irregular spotting/bleeding which she was advised about before starting.  She is not frustrated with the irregular bleeding but does hope this will resolve.  We have discussed other options for treatment including Mirena IUD use vs endometrial ablation.  She is not ready to proceed with either of these.  She wants to give the micronor at least three full months before deciding about cessation.  Overall, she is very pleased.  Denies pelvic pain, vaginal discharge, fever, palpitations, SOB.  Endometrial biopsy was negative for abnormal cells on 01/19/17.  GYNECOLOGIC HISTORY: Patient's last menstrual period was 02/16/2017. Contraception: vasectomy Menopausal hormone therapy: n/a  Patient Active Problem List   Diagnosis Date Noted  . Elevated LFTs 10/11/2016  . Generalized abdominal pain 10/11/2016  . Dyspareunia in female 08/25/2015  . Irritability 04/29/2013  . Irregular menses 04/16/2013  . Abnormal thyroid blood test 04/16/2013  . Vitamin D deficiency 04/16/2013  . H/O: hypothyroidism 11/14/2012    Past Medical History:  Diagnosis Date  . Allergy   . Anxiety   . Asthma   . Chicken pox   . OAB (overactive bladder)   . Thyroid disease    was on low dose synthroid at one time, but removed and levels have been "normal" since and followed thorugh GYN  . Vitamin D deficiency     Past Surgical History:  Procedure Laterality Date  . APPENDECTOMY  1990  . BREAST BIOPSY  2000   biopsy  . CERVICAL CERCLAGE  2009  . HYMENECTOMY  2005  . WISDOM  TOOTH EXTRACTION      MEDS:  Reviewed in EPIC and UTD  ALLERGIES: Dairy aid  [lactase]; Eggs or egg-derived products; Hemp seed oil  [dronabinol]; Levothyroxine; Metronidazole; Nitrofurantoin monohyd macro; Strawberry (diagnostic); Wheat bran; Almond oil; Keflex [cephalexin]; Nickel; Other; Clindamycin; and Cranberry  Family History  Problem Relation Age of Onset  . Hyperlipidemia Mother   . Diabetes Father   . Prostate cancer Father        prostate  . Prostate cancer Maternal Grandfather        prostate  . Prostate cancer Paternal Grandfather        prostate  . Arthritis Maternal Grandmother   . Lung cancer Paternal Grandmother     SH:  Married, non smoker  Review of Systems  Genitourinary:       Irregular bleeding  All other systems reviewed and are negative.   PHYSICAL EXAMINATION:    BP 112/62 (BP Location: Right Arm, Patient Position: Sitting, Cuff Size: Normal)   Pulse (!) 106   Resp 16   Ht 5\' 4"  (1.626 m)   Wt 144 lb 8 oz (65.5 kg)   LMP 02/16/2017   BMI 24.80 kg/m     General appearance: alert, cooperative and appears stated age CV:  Regular rate and rhythm Lungs:  clear to auscultation, no wheezes, rales or rhonchi, symmetric air entry Abdomen: soft, non-tender; bowel sounds normal; no masses,  no organomegaly  Pelvic: not repeated today  Assessment: Menorrhagia with irregular cycles.  Improved  bleeding with micrnor <1cm intramural fibroid  Plan: Return in 6-8 weeks after pt has taken the micronor for a full 3 months.  Will then determine whether to stay on this or change/consider another method.  Pt very comfortable with plan.   ~15 minutes spent with patient >50% of time was in face to face discussion of above.

## 2017-03-12 ENCOUNTER — Ambulatory Visit: Payer: 59 | Admitting: Obstetrics & Gynecology

## 2017-04-02 ENCOUNTER — Telehealth: Payer: Self-pay | Admitting: *Deleted

## 2017-04-02 ENCOUNTER — Encounter: Payer: Self-pay | Admitting: Obstetrics & Gynecology

## 2017-04-02 NOTE — Telephone Encounter (Signed)
Call to patient regarding My Chart message. Denies missed or late pills. States bleeding just continues.  Advised recommend proceed with recheck appointment with Dr Sabra Heck to reassess. Appointment scheduled for 04-05-17 at [place] to provider for final review. Patient agreeable to disposition. Will close encounter.

## 2017-04-02 NOTE — Telephone Encounter (Signed)
My Chart message from patient:  Hello, Dr. Sabra Heck,     You said to let you know if spotting happened for a week. It happened for 6 days so I didn't call. Today, a few days later, I've noticed blood on my pad and just want to make sure that this is okay.    July 11-14 Period-mild and short   July 16-17 tiny spots only   7/20-7/25 spotting (only 1 pad per day)  7/27-7/29 Some blood seen but none on pad, just during restroom  7/30 blood started again, too soon to tell if spotting or period, bu do have very light cramps    Thank you very much,  Jamie Miller

## 2017-04-04 ENCOUNTER — Encounter: Payer: Self-pay | Admitting: Obstetrics & Gynecology

## 2017-04-04 ENCOUNTER — Telehealth: Payer: Self-pay

## 2017-04-04 NOTE — Telephone Encounter (Signed)
Patient calling stating she cancelled appointment for 04-05-17 with Dr. Sabra Heck. She would like to continue with Micronor for the full 3 months to see if spotting gets better, then discuss other options if necessary. In the meantime, she would like to try Flax seed and soy to see if this would help with the spotting. Would like to know what Dr.Miller thinks about this.  Advised patient Dr.Miller not in office today but will discuss with her tomorrow. Has follow up 04-27-17. Routing to Dr. Sabra Heck.

## 2017-04-04 NOTE — Telephone Encounter (Signed)
My Chart message  Hi Dr. Sabra Heck,    Would it be okay if I continued on the Micronor the full 3 months before I come in to further discuss my other 3 options? While the constant spotting is inconvenient, I'm a little hesitant about the 3 other options you've previously discussed.     If that is okay, I'd like to add flax seed, some soy, and possibly other hormone-balancing foods along with taking the Micronor to see if that makes a difference.     Thanks very much,  Jamie Miller

## 2017-04-05 ENCOUNTER — Ambulatory Visit: Payer: 59 | Admitting: Obstetrics & Gynecology

## 2017-04-09 ENCOUNTER — Other Ambulatory Visit: Payer: Self-pay | Admitting: Obstetrics & Gynecology

## 2017-04-09 MED ORDER — NORETHINDRONE 0.35 MG PO TABS: 1.0000 | ORAL_TABLET | Freq: Every day | ORAL | 0 refills | Status: DC

## 2017-04-19 ENCOUNTER — Telehealth: Payer: Self-pay | Admitting: Obstetrics & Gynecology

## 2017-04-19 NOTE — Telephone Encounter (Signed)
Spoke with patient. Patient states that she has been receiving Norethindrone 0.35 mg when she gets her rx filled. Picked up rx yesterday and was given Camila 0.35 mg. Advised this is safe to take. Is a different brand of the same medication and is the same dose. Patient verbalizes understanding.  Routing to provider for final review. Patient agreeable to disposition. Will close encounter.

## 2017-04-19 NOTE — Telephone Encounter (Signed)
Patient called stating that the wrong prescription was called to her pharmacy. Please call cell number first [phone] then home [phone].

## 2017-04-27 ENCOUNTER — Ambulatory Visit (INDEPENDENT_AMBULATORY_CARE_PROVIDER_SITE_OTHER): Payer: 59 | Admitting: Obstetrics & Gynecology

## 2017-04-27 VITALS — BP 110/60 | HR 96 | Resp 16 | Ht 64.0 in | Wt 146.0 lb

## 2017-04-27 DIAGNOSIS — R350 Frequency of micturition: Secondary | ICD-10-CM

## 2017-04-27 DIAGNOSIS — N921 Excessive and frequent menstruation with irregular cycle: Secondary | ICD-10-CM | POA: Diagnosis not present

## 2017-04-27 DIAGNOSIS — N3289 Other specified disorders of bladder: Secondary | ICD-10-CM

## 2017-04-27 LAB — POCT URINALYSIS DIPSTICK
BILIRUBIN UA: NEGATIVE
Blood, UA: NEGATIVE
Glucose, UA: NEGATIVE
KETONES UA: NEGATIVE
Nitrite, UA: NEGATIVE
PROTEIN UA: NEGATIVE
Urobilinogen, UA: 0.2 E.U./dL
pH, UA: 6 (ref 5.0–8.0)

## 2017-04-27 NOTE — Progress Notes (Signed)
GYNECOLOGY  VISIT   HPI: 46 y.o. G69P0101 Married Caucasian female here for follow-up for DUB.  Pt has been on micronor for about two months.  Overall, bleeding is better.  Flow is less and there is less clotting.  She is having a lot of irregular bleeding/spotting.  Typically this is not heavy.  However, it is unpredictable.  She brought a menstraul calendar with her and in the last month, she spotted or bled about 12 days.  This wasn't heavy but she doesn't know when to be prepared for a cycle.    We have discussed additional treatment options and we reviewed these again today including additional hormonal methods, progesterone IUD use, endometrial ablation and hysterectomy.  Risks, benefits, procedures and recovery all discussed.  She really does not want to take any estrogen containing OCPs.  Pt reports bleeding is annoying but is better and thinking about doing something else for treatment is causing a lot of anxiety.  Wants to know the risks of not changing methods.  As she's has a PUS (3/18 with small 36mm fibroid) and endometrial biopsy (5/18 showing inactive endometrium) as well as normal pap smear with neg HR HPV 12/11/16, I do not think she really has much risk in continuing current method.  She reports that thinking about procedures and/or surgery does cause her a lot of anxiety so she may decide not to do anything else for treatment but will consider it.  Pt has another question.  Has urinary urgency and at times feels like she is having bladder spasms.  Does not seem related to any particular foods.  Denies urinary incontinence.  When occurs, uses the bathroom more frequently and will feel like she is "on the verge" of a UTI.  Would like to know treatment options.  Urine culture recommended.  Typical medications, side effects, risks discussed.  She does not want to use medication if not necessary.  GYNECOLOGIC HISTORY: Patient's last menstrual period was 04/20/2017. Contraception: vasectomy   Menopausal hormone therapy: none  Patient Active Problem List   Diagnosis Date Noted  . Elevated LFTs 10/11/2016  . Generalized abdominal pain 10/11/2016  . Dyspareunia in female 08/25/2015  . Irritability 04/29/2013  . Irregular menses 04/16/2013  . Abnormal thyroid blood test 04/16/2013  . Vitamin D deficiency 04/16/2013  . H/O: hypothyroidism 11/14/2012    Past Medical History:  Diagnosis Date  . Allergy   . Anxiety   . Asthma   . Chicken pox   . OAB (overactive bladder)   . Thyroid disease    was on low dose synthroid at one time, but removed and levels have been "normal" since and followed thorugh GYN  . Vitamin D deficiency     Past Surgical History:  Procedure Laterality Date  . APPENDECTOMY  1990  . BREAST BIOPSY  2000   biopsy  . CERVICAL CERCLAGE  2009  . HYMENECTOMY  2005  . WISDOM TOOTH EXTRACTION      MEDS:   Current Outpatient Prescriptions on File Prior to Visit  Medication Sig Dispense Refill  . cholecalciferol (VITAMIN D) 1000 units tablet Take 2,000 Units by mouth daily.    . Loratadine 10 MG CAPS Take 10 mg by mouth as needed. Reported on 01/27/2016    . MULTIPLE VITAMIN PO Take 1 tablet by mouth daily.    . norethindrone (MICRONOR,CAMILA,ERRIN) 0.35 MG tablet Take 1 tablet (0.35 mg total) by mouth daily. 3 Package 0  . Probiotic Product (ACIDOPHILUS/GOAT MILK) CAPS Take  by mouth.    . vitamin E 200 UNIT capsule Take 200 Units by mouth daily.     No current facility-administered medications on file prior to visit.     ALLERGIES: Dairy aid  [lactase]; Eggs or egg-derived products; Hemp seed oil  [dronabinol]; Levothyroxine; Metronidazole; Nitrofurantoin monohyd macro; Strawberry (diagnostic); Wheat bran; Almond oil; Keflex [cephalexin]; Nickel; Other; Clindamycin; and Cranberry  Family History  Problem Relation Age of Onset  . Hyperlipidemia Mother   . Diabetes Father   . Prostate cancer Father        prostate  . Prostate cancer Maternal  Grandfather        prostate  . Prostate cancer Paternal Grandfather        prostate  . Arthritis Maternal Grandmother   . Lung cancer Paternal Grandmother     SH:  Married, non smoker  Review of Systems  Genitourinary: Positive for frequency.  All other systems reviewed and are negative.   PHYSICAL EXAMINATION:    BP 110/60 (BP Location: Right Arm, Patient Position: Sitting, Cuff Size: Normal)   Pulse 96   Resp 16   Ht 5\' 4"  (1.626 m)   Wt 146 lb (66.2 kg)   LMP 04/20/2017   BMI 25.06 kg/m     General appearance: alert, cooperative and appears stated age No other physical exam performed.   Assessment: H/O menorrhagia with irregular cycle that is improved with progesterone only pills and now with DUB 55mm intramural fibroid Urinary urgency/bladder spasm  Plan: Additional options for treatment including other OCPs and progesterone methods, progesterone IUD, and hysterectomy reviewed.  Pt will call and give update after considering options. Urine culture obtained.  Declines additional evaluation and medication at this time.  ~25 minutes spent with patient >50% of time was in face to face discussion of above.

## 2017-04-29 LAB — URINE CULTURE

## 2017-04-30 ENCOUNTER — Encounter: Payer: Self-pay | Admitting: Obstetrics & Gynecology

## 2017-05-02 ENCOUNTER — Encounter: Payer: Self-pay | Admitting: Obstetrics & Gynecology

## 2017-05-03 ENCOUNTER — Telehealth: Payer: Self-pay | Admitting: Obstetrics & Gynecology

## 2017-05-03 NOTE — Telephone Encounter (Signed)
Spoke with patient. Patient states she also sent MyChart message, see message dated 04/30/17, Dr. Sabra Heck has responded and advised. Patient verbalizes understanding and is agreeable.  Routing to provider for final review. Patient is agreeable to disposition. Will close encounter.

## 2017-05-03 NOTE — Telephone Encounter (Signed)
Patient is taking Jamie Miller and wants to wean off of it due to side effects she is experiencing.  States her breast are tender and fibroid cysts are more noticeable.

## 2017-05-04 ENCOUNTER — Encounter: Payer: Self-pay | Admitting: Family Medicine

## 2017-05-18 ENCOUNTER — Ambulatory Visit (INDEPENDENT_AMBULATORY_CARE_PROVIDER_SITE_OTHER): Payer: 59 | Admitting: Family Medicine

## 2017-05-18 ENCOUNTER — Encounter: Payer: Self-pay | Admitting: Family Medicine

## 2017-05-18 VITALS — BP 111/79 | HR 77 | Temp 97.4°F | Resp 20 | Ht 64.0 in | Wt 145.0 lb

## 2017-05-18 DIAGNOSIS — E559 Vitamin D deficiency, unspecified: Secondary | ICD-10-CM

## 2017-05-18 DIAGNOSIS — Z13220 Encounter for screening for lipoid disorders: Secondary | ICD-10-CM

## 2017-05-18 DIAGNOSIS — Z1231 Encounter for screening mammogram for malignant neoplasm of breast: Secondary | ICD-10-CM | POA: Diagnosis not present

## 2017-05-18 DIAGNOSIS — Z1239 Encounter for other screening for malignant neoplasm of breast: Secondary | ICD-10-CM

## 2017-05-18 DIAGNOSIS — Z0000 Encounter for general adult medical examination without abnormal findings: Secondary | ICD-10-CM

## 2017-05-18 DIAGNOSIS — Z131 Encounter for screening for diabetes mellitus: Secondary | ICD-10-CM | POA: Diagnosis not present

## 2017-05-18 NOTE — Progress Notes (Signed)
Patient ID: [name], female  DOB: 09-Mar-1971, 46 y.o.   MRN: [id number] Patient Care Team    Relationship Specialty Notifications Start End  Ma Hillock, DO PCP - General Family Medicine  10/03/16     Chief Complaint  Patient presents with  . Annual Exam    Subjective:  Jamie Miller is a 46 y.o.  Female  present for CPE. All past medical history, surgical history, allergies, family history, immunizations, medications and social history were updated in the electronic medical record today. All recent labs, ED visits and hospitalizations within the last year were reviewed.  Health maintenance:  Colonoscopy: no fhx, screen at 50 Mammogram: completed:08/2015, birads 1. Ordered today Cervical cancer screening: last pap: 12/11/2016, results: Nl/neg HPV, completed by: Dr. Sabra Heck Immunizations: tdap 04/2008, Influenza declined (encouraged yearly) Infectious disease screening: HIV declined DEXA: start screen at 60 Assistive device: none Oxygen FIE:PPIR Patient has a Dental home. Hospitalizations/ED visits: Reviewed  Depression screen [facility] 2/9 05/18/2017 10/03/2016  Decreased Interest 0 0  Down, Depressed, Hopeless 0 -  PHQ - 2 Score 0 0  Altered sleeping 0 -  Tired, decreased energy 0 -  Change in appetite 0 -  Feeling bad or failure about yourself  0 -  Trouble concentrating 0 -  Moving slowly or fidgety/restless 0 -  Suicidal thoughts 0 -  PHQ-9 Score 0 -   No flowsheet data found.   Current Exercise Habits: The patient does not participate in regular exercise at present Exercise limited by: None identified   Immunization History  Administered Date(s) Administered  . Tdap 04/13/2008     Past Medical History:  Diagnosis Date  . Allergy   . Anxiety   . Asthma   . Chicken pox   . OAB (overactive bladder)   . Thyroid disease    was on low dose synthroid at one time, but removed and levels have been "normal" since and followed thorugh GYN  . Vitamin D  deficiency    Allergies  Allergen Reactions  . Dairy Aid  [Lactase] Rash  . Eggs Or Egg-Derived Products Rash  . Hemp Seed Oil  [Dronabinol] Rash    itching  . Levothyroxine     Other reaction(s): Other Tingling in legs; heart palpitation  . Metronidazole     Other reaction(s): Other Cramps  . Nitrofurantoin Monohyd Macro Diarrhea    Other reaction(s): Other Cramps  . Strawberry (Diagnostic) Rash    itching  . Wheat Bran Rash  . Almond Oil   . Keflex [Cephalexin]   . Nickel   . Other     Sensitive to all abx   . Clindamycin     Other reaction(s): Abdominal Pain  . Cranberry     Other reaction(s): Abdominal Pain   Past Surgical History:  Procedure Laterality Date  . APPENDECTOMY  1990  . BREAST BIOPSY  2000   biopsy  . CERVICAL CERCLAGE  2009  . HYMENECTOMY  2005  . WISDOM TOOTH EXTRACTION     Family History  Problem Relation Age of Onset  . Hyperlipidemia Mother   . Diabetes Father   . Prostate cancer Father        prostate  . Prostate cancer Maternal Grandfather        prostate  . Prostate cancer Paternal Grandfather        prostate  . Arthritis Maternal Grandmother   . Lung cancer Paternal Grandmother    Social History  Social History  . Marital status: Married    Spouse name: Richardson Landry  . Number of children: 1  . Years of education: N/A   Occupational History  . Not on file.   Social History Main Topics  . Smoking status: Never Smoker  . Smokeless tobacco: Never Used  . Alcohol use No  . Drug use: No  . Sexual activity: Yes    Partners: Male    Birth control/ protection: Other-see comments     Comment: Husband had vasectomy   Other Topics Concern  . Not on file   Social History Narrative   Married to [place]. Has a daughter Colletta [place].    College degree.    Drinks caffeine. Uses herbal remedies. Takes a daily vitamin.    Wears her seatbelt   Smoke detector in the home.    Feels safe in her relationships.    Allergies as of 05/18/2017       Reactions   Dairy Aid  [lactase] Rash   Eggs Or Egg-derived Products Rash   Hemp Seed Oil  [dronabinol] Rash   itching   Levothyroxine    Other reaction(s): Other Tingling in legs; heart palpitation   Metronidazole    Other reaction(s): Other Cramps   Nitrofurantoin Monohyd Macro Diarrhea   Other reaction(s): Other Cramps   Strawberry (diagnostic) Rash   itching   Wheat Bran Rash   Almond Oil    Keflex [cephalexin]    Nickel    Other    Sensitive to all abx    Clindamycin    Other reaction(s): Abdominal Pain   Cranberry    Other reaction(s): Abdominal Pain      Medication List       Accurate as of 05/18/17  9:31 AM. Always use your most recent med list.          Acidophilus/Goat Milk Caps Take by mouth.   cholecalciferol 1000 units tablet Commonly known as:  VITAMIN D Take 2,000 Units by mouth daily.   Loratadine 10 MG Caps Take 10 mg by mouth as needed. Reported on 01/27/2016   MULTIPLE VITAMIN PO Take 1 tablet by mouth daily.   vitamin E 200 UNIT capsule Take 200 Units by mouth daily.            Discharge Care Instructions        Start     Ordered   05/18/17 0000  Lipid panel     05/18/17 0913   05/18/17 0000  HgB A1c     05/18/17 0913   05/18/17 0000  Vitamin D (25 hydroxy)     05/18/17 0913   05/18/17 0000  MM DIGITAL SCREENING BILATERAL    Question Answer Comment  Reason for Exam (SYMPTOM  OR DIAGNOSIS REQUIRED) screen   Is the patient pregnant? No   Preferred imaging location? [facility]      05/18/17 0920      All past medical history, surgical history, allergies, family history, immunizations andmedications were updated in the EMR today and reviewed under the history and medication portions of their EMR.     Recent Results (from the past 2160 hour(s))  POCT Urinalysis Dipstick     Status: Abnormal   Collection Time: 04/27/17  8:38 AM  Result Value Ref Range   Color, UA yellow    Clarity, UA clear    Glucose, UA  N    Bilirubin, UA N    Ketones, UA N    Spec Grav, UA  1.010 - 1.025   Blood, UA N    pH, UA 6.0 5.0 - 8.0   Protein, UA N    Urobilinogen, UA 0.2 0.2 or 1.0 E.U./dL   Nitrite, UA N    Leukocytes, UA Trace (A) Negative  Urine Culture     Status: None   Collection Time: 04/27/17  9:23 AM  Result Value Ref Range   Urine Culture, Routine Final report    Organism ID, Bacteria Comment     Comment: Mixed urogenital flora 10,000-25,000 colony forming units per mL     Ct Abdomen Pelvis W Contrast  Result Date: 10/25/2016 CLINICAL DATA:  Diffuse abdominal pain particularly on the left for about 3 weeks, elevated liver function tests EXAM: CT ABDOMEN AND PELVIS WITH CONTRAST TECHNIQUE: Multidetector CT imaging of the abdomen and pelvis was performed using the standard protocol following bolus administration of intravenous contrast. CONTRAST:  146mL ISOVUE-300 IOPAMIDOL (ISOVUE-300) INJECTION 61% COMPARISON:  None. FINDINGS: Lower chest: The lung bases are clear. The heart is within normal limits in size. Hepatobiliary: The liver enhances with no focal abnormality and no ductal dilatation is seen. No calcified gallstones are seen Pancreas: The pancreas is normal in size in the pancreatic duct is not dilated. Spleen: The spleen is unremarkable. Adrenals/Urinary Tract: The adrenal glands appear normal with a probable small incidental adrenal adenoma on the left of approximately 8 mm in diameter. The kidneys enhance with no calculus or mass. On delayed images, the pelvocaliceal systems are unremarkable. The ureters appear normal in caliber. The urinary bladder is moderately urine distended with no abnormality noted. Stomach/Bowel: The stomach is moderately distended with oral contrast with no abnormality noted. No small bowel distention is seen. There is a moderate amount of feces throughout the colon. The terminal ileum is unremarkable. The appendix is not definitely visualized. No inflammatory process is  seen. Vascular/Lymphatic: The abdominal aorta is normal in caliber. No adenopathy is noted. Reproductive: The uterus is normal in size. There may be small nabothian cysts cannot exclude small uterine fibroids. Present. Also, small uterine fibroids cannot be excluded with this study. No adnexal lesion is seen. No significant free fluid is noted within the pelvis. Other: None Musculoskeletal: The lumbar vertebrae are in normal alignment with normal intervertebral disc spaces. No compression deformity is seen. The SI joints appear corticated. Both hip joint spaces are relatively normal for age. IMPRESSION: 1. No explanation is seen for the patient's diffuse abdominal pain. 2. No renal or ureteral calculi are seen. 3. Electronically Signed   By: Ivar Drape M.D.   On: 10/25/2016 13:34     ROS: 14 pt review of systems performed and negative (unless mentioned in an HPI)  Objective: BP 111/79 (BP Location: Left Arm, Patient Position: Sitting, Cuff Size: Normal)   Pulse 77   Temp (!) 97.4 F (36.3 C)   Resp 20   Ht 5\' 4"  (1.626 m)   Wt 145 lb (65.8 kg)   LMP 04/20/2017   SpO2 99%   BMI 24.89 kg/m  Gen: Afebrile. No acute distress. Nontoxic in appearance, well-developed, well-nourished,  Pleasant, caucasian female. HENT: AT. [place]. Bilateral TM visualized and normal in appearance, normal external auditory canal. MMM, no oral lesions, adequate dentition. Bilateral nares within normal limits. Throat without erythema, ulcerations or exudates. no Cough on exam, no hoarseness on exam. Eyes:Pupils Equal Round Reactive to light, Extraocular movements intact,  Conjunctiva without redness, discharge or icterus. Neck/lymp/endocrine: Supple,no lymphadenopathy, no thyromegaly CV: RRR no murmur, noedema, +  2/4 P posterior tibialis pulses. no carotid bruits. No JVD. Chest: CTAB, no wheeze, rhonchi or crackles. normal Respiratory effort. good Air movement. Abd: Soft. flat. NTND. BS present. no Masses palpated. No  hepatosplenomegaly. No rebound tenderness or guarding. Skin: no rashes, purpura or petechiae. Warm and well-perfused. Skin intact. Neuro/Msk:  Normal gait. PERLA. EOMi. Alert. Oriented x3.  Cranial nerves II through XII intact. Muscle strength 5/5 upper/lower extremity. DTRs equal bilaterally. Psych: Normal affect, dress and demeanor. Normal speech. Normal thought content and judgment.   No exam data present  Assessment/plan: Jamie Miller is a 46 y.o. female present for CPE  Encounter for preventive health examination Patient was encouraged to exercise greater than 150 minutes a week. Patient was encouraged to choose a diet filled with fresh fruits and vegetables, and lean meats. AVS provided to patient today for education/recommendation on gender specific health and safety maintenance. Colonoscopy: no fhx, screen at 50 Mammogram: completed:08/2015, birads 1. Ordered today Cervical cancer screening: last pap: 12/11/2016, results: Nl/neg HPV, completed by: Dr. Sabra Heck Immunizations: tdap 04/2008, Influenza declined (encouraged yearly) Infectious disease screening: HIV declined DEXA: start screen at 1 Lipid screening - Lipid panel Screening for diabetes mellitus - HgB A1c Vitamin D deficiency - Vitamin D (25 hydroxy) Breast cancer screening - MM DIGITAL SCREENING BILATERAL; Future  Return in about 1 year (around 05/18/2018) for CPE. Nurse visit next week for PPD --> reading and then form completion.   Electronically signed by: Howard Pouch, DO [place]

## 2017-05-18 NOTE — Patient Instructions (Signed)

## 2017-05-19 LAB — HEMOGLOBIN A1C
EAG (MMOL/L): 5.5 (calc)
Hgb A1c MFr Bld: 5.1 % of total Hgb (ref <5.7)
Mean Plasma Glucose: 100 (calc)

## 2017-05-19 LAB — LIPID PANEL
CHOL/HDL RATIO: 2.8 (calc) (ref <5.0)
Cholesterol: 162 mg/dL (ref <200)
HDL: 57 mg/dL (ref >50)
LDL CHOLESTEROL (CALC): 91 mg/dL
NON-HDL CHOLESTEROL (CALC): 105 mg/dL (ref <130)
Triglycerides: 62 mg/dL (ref <150)

## 2017-05-19 LAB — VITAMIN D 25 HYDROXY (VIT D DEFICIENCY, FRACTURES): VIT D 25 HYDROXY: 46 ng/mL (ref 30–100)

## 2017-06-07 ENCOUNTER — Telehealth: Payer: Self-pay | Admitting: Obstetrics & Gynecology

## 2017-06-07 ENCOUNTER — Ambulatory Visit (INDEPENDENT_AMBULATORY_CARE_PROVIDER_SITE_OTHER): Payer: 59 | Admitting: Obstetrics & Gynecology

## 2017-06-07 ENCOUNTER — Encounter: Payer: Self-pay | Admitting: Obstetrics & Gynecology

## 2017-06-07 VITALS — BP 104/64 | HR 94 | Resp 14 | Ht 64.0 in | Wt 145.0 lb

## 2017-06-07 DIAGNOSIS — R300 Dysuria: Secondary | ICD-10-CM | POA: Diagnosis not present

## 2017-06-07 DIAGNOSIS — R309 Painful micturition, unspecified: Secondary | ICD-10-CM

## 2017-06-07 LAB — POCT URINALYSIS DIPSTICK
BILIRUBIN UA: NEGATIVE
GLUCOSE UA: NEGATIVE
Ketones, UA: NEGATIVE
NITRITE UA: NEGATIVE
Protein, UA: NEGATIVE
Urobilinogen, UA: 0.2 E.U./dL
pH, UA: 5 (ref 5.0–8.0)

## 2017-06-07 MED ORDER — SULFAMETHOXAZOLE-TRIMETHOPRIM 800-160 MG PO TABS: 1.0000 | ORAL_TABLET | Freq: Two times a day (BID) | ORAL | 0 refills | Status: DC

## 2017-06-07 NOTE — Telephone Encounter (Signed)
Spoke with patient. Patient states that since Sunday she has been having pain with urination first thing in the morning. After drinking 40 ounces or more of water does not have pain. Denies any lower back pain, fever, or chills. Advised she will need to be seen in the office for further evaluation. Patient is agreeable. Appointment scheduled for today 06/07/2017 at 10 am with Dr.Miller. Patient is agreeable to date and time.  Routing to provider for final review. Patient agreeable to disposition. Will close encounter.

## 2017-06-07 NOTE — Telephone Encounter (Signed)
Patient thinks she may have a uti.  °

## 2017-06-07 NOTE — Progress Notes (Signed)
GYNECOLOGY  VISIT  CC:   Burning with urination  HPI: 46 y.o. G75P0101 Married Caucasian female here for complaint of burning with urination.  Has been drinking a lot of water so symptoms feel better today.  Cycle started on Saturday so she is not sure about blood in her urine.  Denies back pain.  Having no fevers.  Has had some upper left abdominal pain that she feels is due to constipation.  She has been taking some miralax and that has helped with the constipation.  GYNECOLOGIC HISTORY: Patient's last menstrual period was 06/02/2017. Contraception: vasectomy Menopausal hormone therapy: none  Patient Active Problem List   Diagnosis Date Noted  . Generalized abdominal pain 10/11/2016  . Dyspareunia in female 08/25/2015  . Irregular menses 04/16/2013  . Vitamin D deficiency 04/16/2013  . H/O: hypothyroidism 11/14/2012    Past Medical History:  Diagnosis Date  . Allergy   . Anxiety   . Asthma   . Chicken pox   . OAB (overactive bladder)   . Thyroid disease    was on low dose synthroid at one time, but removed and levels have been "normal" since and followed thorugh GYN  . Vitamin D deficiency     Past Surgical History:  Procedure Laterality Date  . APPENDECTOMY  1990  . BREAST BIOPSY  2000   biopsy  . CERVICAL CERCLAGE  2009  . HYMENECTOMY  2005  . WISDOM TOOTH EXTRACTION      MEDS:   Current Outpatient Prescriptions on File Prior to Visit  Medication Sig Dispense Refill  . cholecalciferol (VITAMIN D) 1000 units tablet Take 2,000 Units by mouth daily.    . MULTIPLE VITAMIN PO Take 1 tablet by mouth daily.    . Probiotic Product (ACIDOPHILUS/GOAT MILK) CAPS Take by mouth.    . vitamin E 200 UNIT capsule Take 200 Units by mouth daily.     No current facility-administered medications on file prior to visit.     ALLERGIES: Dairy aid  [lactase]; Eggs or egg-derived products; Hemp seed oil  [dronabinol]; Levothyroxine; Metronidazole; Nitrofurantoin monohyd macro;  Strawberry (diagnostic); Wheat bran; Almond oil; Keflex [cephalexin]; Nickel; Other; Clindamycin; and Cranberry  Family History  Problem Relation Age of Onset  . Hyperlipidemia Mother   . Diabetes Father   . Prostate cancer Father        prostate  . Prostate cancer Maternal Grandfather        prostate  . Prostate cancer Paternal Grandfather        prostate  . Arthritis Maternal Grandmother   . Lung cancer Paternal Grandmother     SH:  Married, non smoker  Review of Systems  Constitutional: Negative for chills, fever and malaise/fatigue.  Respiratory: Negative.   Cardiovascular: Negative.   Genitourinary: Positive for dysuria, frequency and urgency. Negative for flank pain and hematuria.    PHYSICAL EXAMINATION:    BP 104/64 (BP Location: Right Arm, Patient Position: Sitting, Cuff Size: Normal)   Pulse 94   Resp 14   Ht 5\' 4"  (1.626 m)   Wt 145 lb (65.8 kg)   LMP 06/02/2017   BMI 24.89 kg/m     General appearance: alert, cooperative and appears stated age Abdomen: soft, non-tender; bowel sounds normal; no masses,  no organomegaly  Pelvic: External genitalia:  no lesions              Urethra:  normal appearing urethra with no masses, tenderness or lesions  Bartholins and Skenes: normal                 Vagina: normal appearing vagina with normal color and discharge, no lesions              Cervix: no lesions              Bimanual Exam:  Uterus:  normal size, contour, position, consistency, mobility, non-tender              Adnexa: no mass, fullness, tenderness              Anus:  no lesions  Chaperone was present for exam.  Assessment: Dysuria, possible cystitis  Plan: Urine culture pending. Bactrim DS BID x 3 days sent to pharmacy on file. Results will be called to pt.

## 2017-06-09 LAB — URINE CULTURE

## 2017-06-29 ENCOUNTER — Ambulatory Visit (INDEPENDENT_AMBULATORY_CARE_PROVIDER_SITE_OTHER): Payer: 59

## 2017-06-29 DIAGNOSIS — Z1239 Encounter for other screening for malignant neoplasm of breast: Secondary | ICD-10-CM

## 2017-06-29 DIAGNOSIS — Z1231 Encounter for screening mammogram for malignant neoplasm of breast: Secondary | ICD-10-CM | POA: Diagnosis not present

## 2017-08-08 DIAGNOSIS — R198 Other specified symptoms and signs involving the digestive system and abdomen: Secondary | ICD-10-CM | POA: Diagnosis not present

## 2017-08-08 DIAGNOSIS — R1032 Left lower quadrant pain: Secondary | ICD-10-CM | POA: Diagnosis not present

## 2017-08-22 ENCOUNTER — Telehealth: Payer: Self-pay | Admitting: Obstetrics & Gynecology

## 2017-08-22 ENCOUNTER — Encounter: Payer: Self-pay | Admitting: Obstetrics & Gynecology

## 2017-08-22 NOTE — Telephone Encounter (Signed)
I think she should start with her PCP on this one since symptoms are not clearly GYN in nature.  Thanks.

## 2017-08-22 NOTE — Telephone Encounter (Signed)
Patient sent the following message through MyChart:  ----- Message from [place], Generic sent at 08/22/2017 6:41 AM EST -----    Hello, Dr. Sabra Heck,    I hope you are well.   I'm writing as I have been experiencing something I've never had before. It's been there a few days. When I bend down, it feels like there is gas/air wanting to come out but doesn't. When I stand back up that feeling improves. I do have gas occasionally but it doesn't fix the issue.   When I walk it feels different, like I'm waddling, not sure how to explain it. Sometimes after standing up to go to the bathroom I'll feel a trickle even though I just emptied my bladder. If I am sitting down or lying down (not moving really) these symptoms are not there. Not sure if this is related, but a digestive doctor diagnosed me with IBS and I have been on the lowfod elimination diet for a week now. On the good side, I haven't had a period or any sight of blood for 2 months.    Thanks so much,  Jamie Miller   Sent patient a message back to expect a return call from the nurse.

## 2017-08-22 NOTE — Telephone Encounter (Signed)
Routing to [place] for review and advised. Will recommend patient follow up with GI for GI symptoms. OV for evaluation of urinary leakage? Please advise.

## 2017-08-22 NOTE — Telephone Encounter (Signed)
Spoke with patient. Advised of message as seen below from [place]. Patient is agreeable and verbalizes understanding. Will contact PCP.  Encounter closed.

## 2017-08-27 ENCOUNTER — Ambulatory Visit: Payer: Self-pay | Admitting: Family Medicine

## 2017-09-03 ENCOUNTER — Ambulatory Visit: Payer: 59 | Admitting: Family Medicine

## 2017-09-06 ENCOUNTER — Ambulatory Visit (INDEPENDENT_AMBULATORY_CARE_PROVIDER_SITE_OTHER): Payer: 59 | Admitting: Family Medicine

## 2017-09-06 ENCOUNTER — Ambulatory Visit: Payer: Self-pay | Admitting: Family Medicine

## 2017-09-06 ENCOUNTER — Encounter: Payer: Self-pay | Admitting: Family Medicine

## 2017-09-06 ENCOUNTER — Ambulatory Visit (HOSPITAL_BASED_OUTPATIENT_CLINIC_OR_DEPARTMENT_OTHER)
Admission: RE | Admit: 2017-09-06 | Discharge: 2017-09-06 | Disposition: A | Discharge status: Home / Self Care | Payer: 59 | Source: Ambulatory Visit | Attending: Family Medicine | Admitting: Family Medicine

## 2017-09-06 VITALS — BP 121/80 | HR 80 | Temp 97.7°F | Resp 20 | Ht 64.0 in | Wt 139.5 lb

## 2017-09-06 DIAGNOSIS — R8290 Unspecified abnormal findings in urine: Secondary | ICD-10-CM

## 2017-09-06 DIAGNOSIS — M79605 Pain in left leg: Secondary | ICD-10-CM | POA: Diagnosis not present

## 2017-09-06 DIAGNOSIS — N3289 Other specified disorders of bladder: Secondary | ICD-10-CM

## 2017-09-06 DIAGNOSIS — M5416 Radiculopathy, lumbar region: Secondary | ICD-10-CM

## 2017-09-06 DIAGNOSIS — R32 Unspecified urinary incontinence: Secondary | ICD-10-CM | POA: Diagnosis not present

## 2017-09-06 DIAGNOSIS — M5116 Intervertebral disc disorders with radiculopathy, lumbar region: Secondary | ICD-10-CM | POA: Diagnosis not present

## 2017-09-06 DIAGNOSIS — M4726 Other spondylosis with radiculopathy, lumbar region: Secondary | ICD-10-CM | POA: Insufficient documentation

## 2017-09-06 DIAGNOSIS — R350 Frequency of micturition: Secondary | ICD-10-CM | POA: Diagnosis not present

## 2017-09-06 LAB — POC URINALSYSI DIPSTICK (AUTOMATED)
Bilirubin, UA: NEGATIVE
Glucose, UA: NEGATIVE
KETONES UA: NEGATIVE
Nitrite, UA: NEGATIVE
Protein, UA: NEGATIVE
RBC UA: NEGATIVE
SPEC GRAV UA: 1.02 (ref 1.010–1.025)
Urobilinogen, UA: 1 E.U./dL
pH, UA: 8.5 — AB (ref 5.0–8.0)

## 2017-09-06 MED ORDER — SULFAMETHOXAZOLE-TRIMETHOPRIM 800-160 MG PO TABS: 1.0000 | ORAL_TABLET | Freq: Two times a day (BID) | ORAL | 0 refills | Status: DC

## 2017-09-06 NOTE — Progress Notes (Signed)
Jamie Miller , 21-Jan-1971, 48 y.o., female MRN: [id number] Patient Care Team    Relationship Specialty Notifications Start End  Ma Hillock, DO PCP - General Family Medicine  10/03/16     Chief Complaint  Patient presents with  . Leg Pain    left  . bladder leakage     Subjective: Pt presents for an OV with multiple complaints  Urinary incontinence: Patient presents today with a history of urinary continence and bladder spasm. She reports approximately 2.5 weeks ago she had an increase in her urinary continence with "hiccup feeling" near the area of her bladder. He feels urinary frequency and urgency. She reports she's had urinary incontinence for years. She reports seeing a urologist a long time ago after the birth of her child and had pelvic floor physical therapy. Her gynecological exam was this summer and per records no cystocele or rectocele was found, normal exam. She denies fever, chills, nausea or vomit. She is eating and drinking well.  Left leg discomfort: Patient reports she has had left lateral leg numbness and tingling intermittently over the last 6 months. She states she had a mild back injury around that time at work when she was lifting books. She was not seen for the injury, but she did wear a back brace and she felt her symptoms were better. She reports the tingling sensation occurs intermittently and does not seem to be associated with any activity. She continues to use heat and massage as needed. She has no bladder or bowel changes, she has had chronic urinary leakage prior to injury.  Depression screen [facility] 2/9 05/18/2017 10/03/2016  Decreased Interest 0 0  Down, Depressed, Hopeless 0 -  PHQ - 2 Score 0 0  Altered sleeping 0 -  Tired, decreased energy 0 -  Change in appetite 0 -  Feeling bad or failure about yourself  0 -  Trouble concentrating 0 -  Moving slowly or fidgety/restless 0 -  Suicidal thoughts 0 -  PHQ-9 Score 0 -    Allergies  Allergen  Reactions  . Dairy Aid  [Lactase] Rash  . Eggs Or Egg-Derived Products Rash  . Hemp Seed Oil  [Dronabinol] Rash    itching  . Levothyroxine     Other reaction(s): Other Tingling in legs; heart palpitation  . Metronidazole     Other reaction(s): Other Cramps  . Nitrofurantoin Monohyd Macro Diarrhea    Other reaction(s): Other Cramps  . Strawberry (Diagnostic) Rash    itching  . Wheat Bran Rash  . Almond Oil   . Keflex [Cephalexin]   . Nickel   . Other     Sensitive to all abx   . Clindamycin     Other reaction(s): Abdominal Pain  . Cranberry     Other reaction(s): Abdominal Pain   Social History   Tobacco Use  . Smoking status: Never Smoker  . Smokeless tobacco: Never Used  Substance Use Topics  . Alcohol use: No   Past Medical History:  Diagnosis Date  . Allergy   . Anxiety   . Asthma   . Chicken pox   . OAB (overactive bladder)   . Thyroid disease    was on low dose synthroid at one time, but removed and levels have been "normal" since and followed thorugh GYN  . Vitamin D deficiency    Past Surgical History:  Procedure Laterality Date  . APPENDECTOMY  1990  . BREAST BIOPSY  2000   biopsy  .  CERVICAL CERCLAGE  2009  . HYMENECTOMY  2005  . WISDOM TOOTH EXTRACTION     Family History  Problem Relation Age of Onset  . Hyperlipidemia Mother   . Diabetes Father   . Prostate cancer Father        prostate  . Prostate cancer Maternal Grandfather        prostate  . Prostate cancer Paternal Grandfather        prostate  . Arthritis Maternal Grandmother   . Lung cancer Paternal Grandmother    Allergies as of 09/06/2017      Reactions   Dairy Aid  [lactase] Rash   Eggs Or Egg-derived Products Rash   Hemp Seed Oil  [dronabinol] Rash   itching   Levothyroxine    Other reaction(s): Other Tingling in legs; heart palpitation   Metronidazole    Other reaction(s): Other Cramps   Nitrofurantoin Monohyd Macro Diarrhea   Other reaction(s): Other Cramps    Strawberry (diagnostic) Rash   itching   Wheat Bran Rash   Almond Oil    Keflex [cephalexin]    Nickel    Other    Sensitive to all abx    Clindamycin    Other reaction(s): Abdominal Pain   Cranberry    Other reaction(s): Abdominal Pain      Medication List        Accurate as of 09/06/17  9:28 AM. Always use your most recent med list.          Acidophilus/Goat Milk Caps Take by mouth.   cholecalciferol 1000 units tablet Commonly known as:  VITAMIN D Take 2,000 Units by mouth daily.   loratadine 10 MG tablet Commonly known as:  CLARITIN Take 10 mg by mouth daily.   MIRALAX PO Take by mouth daily.   MULTIPLE VITAMIN PO Take 1 tablet by mouth daily.   vitamin E 200 UNIT capsule Take 200 Units by mouth daily.       All past medical history, surgical history, allergies, family history, immunizations andmedications were updated in the EMR today and reviewed under the history and medication portions of their EMR.     ROS: Negative, with the exception of above mentioned in HPI   Objective:  BP 121/80 (BP Location: Right Arm, Patient Position: Sitting, Cuff Size: Normal)   Pulse 80   Temp 97.7 F (36.5 C)   Resp 20   Ht 5\' 4"  (1.626 m)   Wt 139 lb 8 oz (63.3 kg)   SpO2 99%   BMI 23.95 kg/m  Body mass index is 23.95 kg/m. Gen: Afebrile. No acute distress. Nontoxic in appearance, well developed, well nourished. Anxious. HENT: AT. [place].  MMM, no oral lesions.  Eyes:Pupils Equal Round Reactive to light, Extraocular movements intact,  Conjunctiva without redness, discharge or icterus. Neck/lymp/endocrine: Supple, no lymphadenopathy CV: RRR no murmur, no edema Chest: CTAB, no wheeze or crackles. Good air movement, normal resp effort.  Abd: Soft. Flat. NTND. BS present. No Masses palpated. No rebound or guarding.  MSK: No CVA tenderness bilaterally.    - Lumbar spine: No erythema, no soft tissue swelling, no paraspinal fullness. No bony tenderness. Full range of  motion lumbar spine, mild discomfort with left side bending only. Negative SLR bilaterally. Negative Faber bilaterally. Neurovascularly intact distally  Skin: No rashes, purpura or petechiae.  Neuro: Normal gait. PERLA. EOMi. Alert. Oriented x3 Cranial nerves II through XII intact. Muscle strength 5/5 bilateral lower extremity extremity. DTRs equal bilaterally  Psych: Anxious, otherwise  Normal affect, dress and demeanor. Normal speech. Normal thought content and judgment.  No exam data present No results found. Results for orders placed or performed in visit on 09/06/17 (from the past 24 hour(s))  POCT Urinalysis Dipstick (Automated)     Status: Abnormal   Collection Time: 09/06/17  9:16 AM  Result Value Ref Range   Color, UA yellow    Clarity, UA clear    Glucose, UA negative    Bilirubin, UA negative    Ketones, UA negative    Spec Grav, UA 1.020 1.010 - 1.025   Blood, UA negative    pH, UA 8.5 (A) 5.0 - 8.0   Protein, UA negative    Urobilinogen, UA 1.0 0.2 or 1.0 E.U./dL   Nitrite, UA negative    Leukocytes, UA Small (1+) (A) Negative    Assessment/Plan: Jamie Miller is a 47 y.o. female present for OV for  Urinary frequency/incontinent and female/bladder spasm -  Patient with similar onset of symptoms was seen in gynecology a few months ago and she had a urinary tract infection at that time. Urine with small leukocytes today, with some for culture. Treat prophylactically with Bactrim. - Bladder spasms and discomfort may be contributing to urinary tract infection, however the bladder incontinence and leakage is an ongoing problem. Discussed options with her today and agreed to refer to urology for further bladder/urinary studies. - POCT Urinalysis Dipstick (Automated): Small leukocytes--> urine culture sent. - Ambulatory referral to Urology - Follow-up when necessary   Lumbar radicular pain/Pain of left lower extremity - Greater than six-month injury with intermittent lower back  pain and left-sided hip to mid thigh tingling sensation. Start with lumbar x-ray. - DG Lumbar Spine Complete; Future - Physical therapy referral placed. If does not respond to physical therapy, would consider further imaging versus neurological referral.     Reviewed expectations re: course of current medical issues.  Discussed self-management of symptoms.  Outlined signs and symptoms indicating need for more acute intervention.  Patient verbalized understanding and all questions were answered.  Patient received an After-Visit Summary.    Orders Placed This Encounter  Procedures  . Urine Culture  . DG Lumbar Spine Complete  . POCT Urinalysis Dipstick (Automated)     Note is dictated utilizing voice recognition software. Although note has been proof read prior to signing, occasional typographical errors still can be missed. If any questions arise, please do not hesitate to call for verification.   electronically signed by:  Howard Pouch, DO  [place]

## 2017-09-06 NOTE — Patient Instructions (Signed)
Bladder: start bactrim, I will call you Monday with results on culture. Spasms may be from repeat infection. I am still referring you to urology for bladder studies for your leakage.   Leg/back: Please have xray completed before Saturday at [facility] point. Depending on results may refer to physical therapy or neurologist.   Please help [place] help you:  We are honored you have chosen [place] for your Primary Care home. Below you will find basic instructions that you may need to access in the future. Please help [place] help you by reading the instructions, which cover many of the frequent questions we experience.   Prescription refills and request:  -In order to allow more efficient response time, please call your pharmacy for all refills. They will forward the request electronically to [place]. This allows for the quickest possible response. Request left on a nurse line can take longer to refill, since these are checked as time allows between office patients and other phone calls.  - refill request can take up to 3-5 working days to complete.  - If request is sent electronically and request is appropiate, it is usually completed in 1-2 business days.  - all patients will need to be seen routinely for all chronic medical conditions requiring prescription medications (see follow-up below). If you are overdue for follow up on your condition, you will be asked to make an appointment and we will call in enough medication to cover you until your appointment (up to 30 days).  - all controlled substances will require a face to face visit to request/refill.  - if you desire your prescriptions to go through a new pharmacy, and have an active script at original pharmacy, you will need to call your pharmacy and have scripts transferred to new pharmacy. This is completed between the pharmacy locations and not by your provider.    Results: If any images or labs were ordered, it can take up to 1 week to get  results depending on the test ordered and the lab/facility running and resulting the test. - Normal or stable results, which do not need further discussion, may be released to your mychart immediately with attached note to you. A call may not be generated for normal results. Please make certain to sign up for mychart. If you have questions on how to activate your mychart you can call the front office.  - If your results need further discussion, our office will attempt to contact you via phone, and if unable to reach you after 2 attempts, we will release your abnormal result to your mychart with instructions.  - All results will be automatically released in mychart after 1 week.  - Your provider will provide you with explanation and instruction on all relevant material in your results. Please keep in mind, results and labs may appear confusing or abnormal to the untrained eye, but it does not mean they are actually abnormal for you personally. If you have any questions about your results that are not covered, or you desire more detailed explanation than what was provided, you should make an appointment with your provider to do so.   Our office handles many outgoing and incoming calls daily. If we have not contacted you within 1 week about your results, please check your mychart to see if there is a message first and if not, then contact our office.  In helping with this matter, you help decrease call volume, and therefore allow [place] to be able  to respond to patients needs more efficiently.   Acute office visits (sick visit):  An acute visit is intended for a new problem and are scheduled in shorter time slots to allow schedule openings for patients with new problems. This is the appropriate visit to discuss a new problem. In order to provide you with excellent quality medical care with proper time for you to explain your problem, have an exam and receive treatment with instructions, these appointments should  be limited to one new problem per visit. If you experience a new problem, in which you desire to be addressed, please make an acute office visit, we save openings on the schedule to accommodate you. Please do not save your new problem for any other type of visit, let us take care of it properly and quickly for you.   Follow up visits:  Depending on your condition(s) your provider will need to see you routinely in order to provide you with quality care and prescribe medication(s). Most chronic conditions (Example: hypertension, Diabetes, depression/anxiety... etc), require visits a couple times a year. Your provider will instruct you on proper follow up for your personal medical conditions and history. Please make certain to make follow up appointments for your condition as instructed. Failing to do so could result in lapse in your medication treatment/refills. If you request a refill, and are overdue to be seen on a condition, we will always provide you with a 30 day script (once) to allow you time to schedule.    Medicare wellness (well visit): - we have a wonderful Nurse Maudie Mercury), that will meet with you and provide you will yearly medicare wellness visits. These visits should occur yearly (can not be scheduled less than 1 calendar year apart) and cover preventive health, immunizations, advance directives and screenings you are entitled to yearly through your medicare benefits. Do not miss out on your entitled benefits, this is when medicare will pay for these benefits to be ordered for you.  These are strongly encouraged by your provider and is the appropriate type of visit to make certain you are up to date with all preventive health benefits. If you have not had your medicare wellness exam in the last 12 months, please make certain to schedule one by calling the office and schedule your medicare wellness with Maudie Mercury as soon as possible.   Yearly physical (well visit):  - Adults are recommended to be seen  yearly for physicals. Check with your insurance and date of your last physical, most insurances require one calendar year between physicals. Physicals include all preventive health topics, screenings, medical exam and labs that are appropriate for gender/age and history. You may have fasting labs needed at this visit. This is a well visit (not a sick visit), new problems should not be covered during this visit (see acute visit).  - Pediatric patients are seen more frequently when they are younger. Your provider will advise you on well child visit timing that is appropriate for your their age. - This is not a medicare wellness visit. Medicare wellness exams do not have an exam portion to the visit. Some medicare companies allow for a physical, some do not allow a yearly physical. If your medicare allows a yearly physical you can schedule the medicare wellness with our nurse Maudie Mercury and have your physical with your provider after, on the same day. Please check with insurance for your full benefits.   Late Policy/No Shows:  - all new patients should arrive 15-30 minutes  earlier than appointment to allow [place] time  to  obtain all personal demographics,  insurance information and for you to complete office paperwork. - All established patients should arrive 10-15 minutes earlier than appointment time to update all information and be checked in .  - In our best efforts to run on time, if you are late for your appointment you will be asked to either reschedule or if able, we will work you back into the schedule. There will be a wait time to work you back in the schedule,  depending on availability.  - If you are unable to make it to your appointment as scheduled, please call 24 hours ahead of time to allow [place] to fill the time slot with someone else who needs to be seen. If you do not cancel your appointment ahead of time, you may be charged a no show fee.

## 2017-09-07 ENCOUNTER — Encounter: Payer: Self-pay | Admitting: *Deleted

## 2017-09-07 ENCOUNTER — Telehealth: Payer: Self-pay | Admitting: Family Medicine

## 2017-09-07 ENCOUNTER — Encounter: Payer: Self-pay | Admitting: Family Medicine

## 2017-09-07 DIAGNOSIS — M47817 Spondylosis without myelopathy or radiculopathy, lumbosacral region: Secondary | ICD-10-CM

## 2017-09-07 DIAGNOSIS — M5136 Other intervertebral disc degeneration, lumbar region: Secondary | ICD-10-CM

## 2017-09-07 DIAGNOSIS — M4726 Other spondylosis with radiculopathy, lumbar region: Secondary | ICD-10-CM

## 2017-09-07 DIAGNOSIS — R200 Anesthesia of skin: Secondary | ICD-10-CM

## 2017-09-07 DIAGNOSIS — R202 Paresthesia of skin: Principal | ICD-10-CM

## 2017-09-07 LAB — URINE CULTURE
MICRO NUMBER:: 90011288
SPECIMEN QUALITY: ADEQUATE

## 2017-09-07 NOTE — Telephone Encounter (Signed)
Left message for patient sent information in MY Chart.

## 2017-09-07 NOTE — Telephone Encounter (Signed)
Please call pt - her xray should arthritic changes in her lower back, which can be quite common. This may or may not be the cause of her symptoms.  No fracture  Was observed.  - I would like her to try PT first to see if she can get resolution with strengthening and stretching. If symptoms worsens or is not at least improving with PT, then followup in 4 weeks.  - if she asks we are still waiting on urine culture.

## 2017-09-11 ENCOUNTER — Encounter: Payer: Self-pay | Admitting: Family Medicine

## 2017-09-21 ENCOUNTER — Ambulatory Visit (INDEPENDENT_AMBULATORY_CARE_PROVIDER_SITE_OTHER): Payer: 59 | Admitting: Physical Therapy

## 2017-09-21 DIAGNOSIS — G8929 Other chronic pain: Secondary | ICD-10-CM

## 2017-09-21 DIAGNOSIS — R293 Abnormal posture: Secondary | ICD-10-CM | POA: Diagnosis not present

## 2017-09-21 DIAGNOSIS — M5442 Lumbago with sciatica, left side: Secondary | ICD-10-CM

## 2017-09-21 DIAGNOSIS — M6281 Muscle weakness (generalized): Secondary | ICD-10-CM | POA: Diagnosis not present

## 2017-09-21 NOTE — Patient Instructions (Addendum)
   Hip External Rotation With Pillow: Transverse Plane Stability   Draw belly in tight.  Slowly roll bent knee out. Be sure pelvis does not rotate. Do _10__ times. Restabilize pelvis. Repeat with other leg. Do _1-2__ sets, _1__ times per day.  Leg Lift: One-Leg - don't go for height    Press pelvis down. Keep knee straight; lengthen and lift one leg (from waist). Do not twist body. Keep other leg down. Hold _1__ seconds. Relax. Repeat 10 time. Repeat with other leg. Once a day  TENS UNIT  This is helpful for muscle pain and spasm.   Search and Purchase a TENS 7000 2nd edition at www.tenspros.com or www.amazon.com  (It should be less than $30)     TENS unit instructions:   Do not shower or bathe with the unit on  Turn the unit off before removing electrodes or batteries  If the electrodes lose stickiness add a drop of water to the electrodes after they are disconnected from the unit and place on plastic sheet. If you continued to have difficulty, call the TENS unit company to purchase more electrodes.  Do not apply lotion on the skin area prior to use. Make sure the skin is clean and dry as this will help prolong the life of the electrodes.  After use, always check skin for unusual red areas, rash or other skin difficulties. If there are any skin problems, does not apply electrodes to the same area.  Never remove the electrodes from the unit by pulling the wires.  Do not use the TENS unit or electrodes other than as directed.  Do not change electrode placement without consulting your therapist or physician.  Keep 2 fingers with between each electrode.

## 2017-09-21 NOTE — Therapy (Signed)
[place], [place], [address] Phone: [phone]   Fax:  [phone]  Physical Therapy Evaluation  Patient Details  Name: Jamie Miller MRN: [id number] Date of Birth: 10-Apr-1971 Referring Provider: Dr Howard Pouch   Encounter Date: 09/21/2017  PT End of Session - 09/21/17 1333    Visit Number  1    Number of Visits  12    Date for PT Re-Evaluation  11/02/17    PT Start Time  1025    PT Stop Time  8527    PT Time Calculation (min)  64 min    Activity Tolerance  Patient tolerated treatment well;Other (comment) limited ability to comprehend and perform ther ex       Past Medical History:  Diagnosis Date  . Allergy   . Anxiety   . Asthma   . Chicken pox   . OAB (overactive bladder)   . Thyroid disease    was on low dose synthroid at one time, but removed and levels have been "normal" since and followed thorugh GYN  . Vitamin D deficiency     Past Surgical History:  Procedure Laterality Date  . APPENDECTOMY  1990  . BREAST BIOPSY  2000   biopsy  . CERVICAL CERCLAGE  2009  . HYMENECTOMY  2005  . WISDOM TOOTH EXTRACTION      There were no vitals filed for this visit.   Subjective Assessment - 09/21/17 1333    Subjective  Pt reports intermittent low back pain for the last year.  Currently her pain is mainly in the outside of the Lt thigh and intermiettently into to her foot. This happened after clearing books and doing a lot of bending over alot.  Has tried massage, heat and ice, some of her pelvic floor exercise.      Pertinent History  PT for urinary incontinence 2009.      Diagnostic tests  x-rays of spine degenerative changes    Patient Stated Goals  be healthy with her  back    Currently in Pain?  Yes    Pain Score  6  thigh    Pain Orientation  Left    Pain Descriptors / Indicators  Numbness    Pain Type  Chronic pain    Pain Radiating Towards  into Lt foot plantar surface - pins  and needles this started today    Pain Onset  More than a month ago    Pain Frequency  Constant    Aggravating Factors   thinking about it    Pain Relieving Factors  some relief with heat/ice         [facility] PT Assessment - 09/21/17 0001      Assessment   Medical Diagnosis  Lt lumbar radiculopathy    Referring Provider  Dr Howard Pouch    Onset Date/Surgical Date  09/21/16    Hand Dominance  Left    Next MD Visit  PRN    Prior Therapy  for pelvic pain      Precautions   Precautions  None      Balance Screen   Has the patient fallen in the past 6 months  No      Prior Function   Level of [employer] work    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      Observation/Other Assessments   Focus on Therapeutic Outcomes ([place])  33% limited      Functional Tests   Functional tests  Single leg stance;Squat      Squat   Comments  bilat hip abduction with IR      Single Leg Stance   Comments  WNL      Posture/Postural Control   Posture/Postural Control  Postural limitations    Postural Limitations  Decreased lumbar lordosis;Forward head;Rounded Shoulders;Increased thoracic kyphosis    Posture Comments  Lt ASIS higher in supine, medial maleoli      ROM / Strength   AROM / PROM / Strength  AROM;Strength      AROM   AROM Assessment Site  Lumbar    Lumbar Flexion  to floor    Lumbar Extension  50% limited with discomfort    Lumbar - Right Side Bend  WNl    Lumbar - Left Side Bend  WNL    Lumbar - Right Rotation  WNL    Lumbar - Left Rotation  WNL      Strength   Strength Assessment Site  Hip;Knee;Ankle;Lumbar    Right/Left Hip  Right;Left    Right Hip Flexion  5/5    Right Hip Extension  3/5    Right Hip ABduction  4-/5    Left Hip Flexion  4+/5    Left Hip Extension  3/5    Left Hip ABduction  4/5    Right/Left Knee  -- Rt grossly 4+/5, Lt WNL    Right/Left Ankle  -- WNL    Lumbar Flexion  -- TA poor    Lumbar Extension   -- multifidi poor      Flexibility   Soft Tissue Assessment /Muscle Length  -- LE WNL      Palpation   Spinal mobility  hypomobile in sacrum and lower lumbar with CPA mobs,in all of lumbar with bilat UPA mobs, hypomobile in thoracic spine    Palpation comment  tight and tender in bilat lumbar paraspinals and QLs      Special Tests   Other special tests  (-) lumbar special tests, (+) Lt ilium rotation superiorly             Objective measurements completed on examination: See above findings.      [facility] Adult PT Treatment/Exercise - 09/21/17 0001      Exercises   Exercises  Lumbar      Lumbar Exercises: Seated   Other Seated Lumbar Exercises  seated ant/post pelvic rocks with VC       Lumbar Exercises: Supine   Pelvic Tilt  Other (comment)    Pelvic Tilt Limitations  attempted, pt unable to perform even with physical and verbal cues    Clam  10 reps each side with lots of cues.       Lumbar Exercises: Prone   Straight Leg Raise  10 reps wiht pelvic press and VC       Lumbar Exercises: Quadruped   Madcat/Old Horse  Other (comment)    Madcat/Old Horse Limitations  attempted, pt unable to perform even with physical and verbal cues      Modalities   Modalities  Moist Heat pt declined stim      Moist Heat Therapy   Number Minutes Moist Heat  15 Minutes    Moist Heat Location  Lumbar Spine             PT Education - 09/21/17 1417    Education provided  Yes    Education Details  HEP, TENS    Person(s) Educated  Patient    Methods  Explanation;Demonstration;Handout    Comprehension  Returned demonstration;Verbalized understanding          PT Long Term Goals - 09/21/17 1548      PT LONG TERM GOAL #1   Title  I with advanced HEP for core stability (11/02/17)     Time  6    Period  Weeks    Status  New      PT LONG TERM GOAL #2   Title  report =/> 75% reduction of Lt LE symptoms with walking ( 11/02/17)     Time  6    Period  Weeks    Status  New       PT LONG TERM GOAL #3   Title  improve FOTO =/< 29% limited ( 11/02/17)     Time  6    Period  Weeks    Status  New      PT LONG TERM GOAL #4   Title  maintain level pelvic, not Lt ilium rotation =/> 1 wk (11/02/17)     Time  6    Period  Weeks    Status  New      PT LONG TERM GOAL #5   Title  demo bilat hip strength =/> 5-/5 to allow her to start a walking program (11/02/17)     Time  6    Period  Weeks    Status  New             Plan - 09/21/17 1433    Clinical Impression Statement  47 yo female presents with ~ 1 yr h/o what started out as low back pain after repetitive lifting and now presents as numbness in lateral Lt thigh into foot and intermittent LBP.  She has had PT for pelvic pain almost 10 yrs ago and is hoping to get an order to have this addressed again.  When she gets this order care will be transfered to a pelvic floor specialist. Caitland is very low tone in the back of her body which results in a poorly supported spine. She has core and hip weakness along with difficulty engaging the deeper muscles leading to poor body mechanics. She has pain with spinal mobs in the lower lumbar and throughout the thoracic area.     Clinical Presentation  Stable    Clinical Decision Making  Low    Rehab Potential  Good    PT Frequency  2x / week    PT Duration  6 weeks    PT Treatment/Interventions  Neuromuscular re-education;Dry needling;Manual techniques;Moist Heat;Ultrasound;Therapeutic activities;Patient/family education;Taping;Therapeutic exercise;Cryotherapy;Electrical Stimulation    PT Next Visit Plan  retraining deep core muscles, modalities PRN, improving spinal mobility    Consulted and Agree with Plan of Care  Patient       Patient will benefit from skilled therapeutic intervention in order to improve the following deficits and impairments:  Improper body mechanics, Pain, Postural dysfunction, Impaired tone, Decreased strength, Hypomobility  Visit Diagnosis: Chronic  left-sided low back pain with left-sided sciatica - Plan: PT plan of care cert/re-cert  Muscle weakness (generalized) - Plan: PT plan of care cert/re-cert  Abnormal posture - Plan: PT plan of care cert/re-cert     Problem List Patient Active Problem List   Diagnosis Date Noted  . Spondylosis of lumbosacral region without myelopathy or radiculopathy 09/07/2017  . Generalized abdominal pain 10/11/2016  . Dyspareunia in female 08/25/2015  .  Irregular menses 04/16/2013  . Vitamin D deficiency 04/16/2013  . H/O: hypothyroidism 11/14/2012    Jeral Pinch PT  09/21/2017, 3:55 PM  [facility] [place], [place], [address] Phone: [phone]   Fax:  [phone]  Name: CORVETTE ORSER MRN: [id number] Date of Birth: 07/03/1971

## 2017-09-26 ENCOUNTER — Encounter: Payer: Self-pay | Admitting: Family Medicine

## 2017-09-26 ENCOUNTER — Encounter: Payer: Self-pay | Admitting: *Deleted

## 2017-09-26 ENCOUNTER — Ambulatory Visit: Payer: 59 | Admitting: Physical Therapy

## 2017-09-26 DIAGNOSIS — R293 Abnormal posture: Secondary | ICD-10-CM | POA: Diagnosis not present

## 2017-09-26 DIAGNOSIS — M5442 Lumbago with sciatica, left side: Secondary | ICD-10-CM

## 2017-09-26 DIAGNOSIS — M6281 Muscle weakness (generalized): Secondary | ICD-10-CM | POA: Diagnosis not present

## 2017-09-26 DIAGNOSIS — G8929 Other chronic pain: Secondary | ICD-10-CM

## 2017-09-26 DIAGNOSIS — N393 Stress incontinence (female) (male): Secondary | ICD-10-CM

## 2017-09-26 NOTE — Therapy (Signed)
[place], [place], [address] Phone: [phone]   Fax:  [phone]  Physical Therapy Treatment  Patient Details  Name: Jamie Miller MRN: [id number] Date of Birth: 21-May-1971 Referring Provider: Dr Howard Pouch   Encounter Date: 09/26/2017  PT End of Session - 09/26/17 1104    Visit Number  2    Number of Visits  12    Date for PT Re-Evaluation  11/02/17    PT Start Time  1105    PT Stop Time  1148    PT Time Calculation (min)  43 min    Activity Tolerance  Patient tolerated treatment well       Past Medical History:  Diagnosis Date  . Allergy   . Anxiety   . Asthma   . Chicken pox   . OAB (overactive bladder)   . Thyroid disease    was on low dose synthroid at one time, but removed and levels have been "normal" since and followed thorugh GYN  . Vitamin D deficiency     Past Surgical History:  Procedure Laterality Date  . APPENDECTOMY  1990  . BREAST BIOPSY  2000   biopsy  . CERVICAL CERCLAGE  2009  . HYMENECTOMY  2005  . WISDOM TOOTH EXTRACTION      There were no vitals filed for this visit.  Subjective Assessment - 09/26/17 1105    Subjective  Cathryn reports she noticed some discomfort the first couple of days after her last visit, however hasn't noticed it the last couple of day. Concerned about her rt thumb    Currently in Pain?  No/denies    Pain Radiating Towards  does have numbess in the lateral Lt thigh with rubbing it.                       [facility] Adult PT Treatment/Exercise - 09/26/17 0001      Lumbar Exercises: Seated   Other Seated Lumbar Exercises  sitting on blue disc, facilitation of pelvic motion side/side & FWD/BWD - still very challenging for patient      Lumbar Exercises: Supine   Clam  20 reps VC for form, pt with to much pelvic rocking    Straight Leg Raise  10 reps each side, VC for form    Other Supine Lumbar Exercises  10x5sec table top  hold    Other Supine Lumbar Exercises  10 reps shoulder presses and head presses in hooklying      Lumbar Exercises: Sidelying   Clam  Both;20 reps      Lumbar Exercises: Prone   Straight Leg Raise  20 reps with pelvic press, VC for form    Other Prone Lumbar Exercises  5 reps upper body lifts each, arms at sides, T, W  & Y       declined modalities, reports she doesn't need the heat today.             PT Long Term Goals - 09/26/17 1113      PT LONG TERM GOAL #1   Title  I with advanced HEP for core stability (11/02/17)     Status  On-going      PT LONG TERM GOAL #2   Title  report =/> 75% reduction of Lt LE symptoms with walking ( 11/02/17)     Status  On-going      PT LONG TERM GOAL #3   Title  improve FOTO =/< 29% limited ( 11/02/17)     Status  On-going      PT LONG TERM GOAL #4   Title  maintain level pelvic, not Lt ilium rotation =/> 1 wk (11/02/17)     Status  On-going      PT LONG TERM GOAL #5   Title  demo bilat hip strength =/> 5-/5 to allow her to start a walking program (11/02/17)     Status  On-going            Plan - 09/26/17 1143    Clinical Impression Statement  This is Jamie Miller's second visit, she fatigues with ther ex and requires a lot of cues to keep proper/safe form.  She has connective tissue tightness throughout her back as she feels pulling/tightness along her whole spine with exercise.  No goals met, anticipate slow progress.     Rehab Potential  Good    PT Frequency  2x / week    PT Duration  6 weeks    PT Treatment/Interventions  Neuromuscular re-education;Dry needling;Manual techniques;Moist Heat;Ultrasound;Therapeutic activities;Patient/family education;Taping;Therapeutic exercise;Cryotherapy;Electrical Stimulation    PT Next Visit Plan  retraining deep core muscles, modalities PRN, improving spinal mobility    Consulted and Agree with Plan of Care  Patient       Patient will benefit from skilled therapeutic intervention in order to  improve the following deficits and impairments:  Improper body mechanics, Pain, Postural dysfunction, Impaired tone, Decreased strength, Hypomobility  Visit Diagnosis: Chronic left-sided low back pain with left-sided sciatica  Muscle weakness (generalized)  Abnormal posture     Problem List Patient Active Problem List   Diagnosis Date Noted  . Spondylosis of lumbosacral region without myelopathy or radiculopathy 09/07/2017  . Generalized abdominal pain 10/11/2016  . Dyspareunia in female 08/25/2015  . Irregular menses 04/16/2013  . Vitamin D deficiency 04/16/2013  . H/O: hypothyroidism 11/14/2012    Jeral Pinch PT  09/26/2017, 11:49 AM  [facility] [place], [place], [address] Phone: [phone]   Fax:  [phone]  Name: Jamie Miller MRN: [id number] Date of Birth: [birth year]

## 2017-09-26 NOTE — Patient Instructions (Signed)
   Massage into web space of Rt thumb, press deep into tender tissue, can use ice there 10-15 a day as well and perform Abduction: Isometric    Position Patient: Keep left hand steady, thumb away from palm at right angle. Helper: Stabilize with hand around palm. Place fingers of other hand along outer edge of thumb. Motion - Cue patient to press thumb against helper's fingers. -Wrist does not move. -Helper blocks movement. Hold _5__ seconds. Relax. Repeat _10__ times.  Do __1_ sessions per day. Variation: Perform with thumb level with palm. Cue patient to press outward.

## 2017-09-26 NOTE — Telephone Encounter (Signed)
Referral placed for pelvic floor physical therapy for urinary incontinence

## 2017-09-27 ENCOUNTER — Encounter: Payer: Self-pay | Admitting: Physical Therapy

## 2017-09-27 ENCOUNTER — Ambulatory Visit: Payer: 59 | Attending: Family Medicine | Admitting: Physical Therapy

## 2017-09-27 ENCOUNTER — Other Ambulatory Visit: Payer: Self-pay

## 2017-09-27 DIAGNOSIS — M62838 Other muscle spasm: Secondary | ICD-10-CM | POA: Diagnosis not present

## 2017-09-27 DIAGNOSIS — R293 Abnormal posture: Secondary | ICD-10-CM | POA: Diagnosis present

## 2017-09-27 DIAGNOSIS — M6281 Muscle weakness (generalized): Secondary | ICD-10-CM | POA: Diagnosis present

## 2017-09-27 DIAGNOSIS — R252 Cramp and spasm: Secondary | ICD-10-CM

## 2017-09-27 NOTE — Therapy (Signed)
[facility] [address] [place], [place], [address] Phone: [phone]   Fax:  [phone]  Physical Therapy Evaluation  Patient Details  Name: Jamie Miller MRN: [id number] Date of Birth: Jan 27, 1971 Referring Provider: Dr. Howard Pouch   Encounter Date: 09/27/2017  PT End of Session - 09/27/17 1138    Visit Number  3    Date for PT Re-Evaluation  11/22/17    Authorization Type  [employer]    PT Start Time  0930    PT Stop Time  1015    PT Time Calculation (min)  45 min    Activity Tolerance  Patient tolerated treatment well    Behavior During Therapy  [facility] for tasks assessed/performed       Past Medical History:  Diagnosis Date  . Allergy   . Anxiety   . Asthma   . Chicken pox   . OAB (overactive bladder)   . Thyroid disease    was on low dose synthroid at one time, but removed and levels have been "normal" since and followed thorugh GYN  . Vitamin D deficiency     Past Surgical History:  Procedure Laterality Date  . APPENDECTOMY  1990  . BREAST BIOPSY  2000   biopsy  . CERVICAL CERCLAGE  2009  . HYMENECTOMY  2005  . WISDOM TOOTH EXTRACTION      There were no vitals filed for this visit.   Subjective Assessment - 09/27/17 0941    Subjective  Patient reports stress incontinence before Christmas. When she walk and bend down and felt like she was going to the bathroom. Pelvic floor therapy in integrative therapy. Urgency to go to the bathroom is still there.  Pain with intercourse for 13 years and had hymenectomy due to not breaking. Pain with deep penetration. Climax when not deep vaginally.     Pertinent History  PT for urinary incontinence 2009.      Diagnostic tests  x-rays of spine degenerative changes    Patient Stated Goals  not worry about urinary leakage, decreased pain with intercourse    Currently in Pain?  Yes    Pain Score  9  vaginal penetration    Pain Location  Vagina    Pain Orientation   Mid    Pain Descriptors / Indicators  Penetrating    Pain Type  Chronic pain    Aggravating Factors   intercourse with him on top    Pain Relieving Factors  no intercourse vaginally,     Multiple Pain Sites  No         [facility] PT Assessment - 09/27/17 0001      Assessment   Medical Diagnosis  N393.3 Stress incontinence of urine    Referring Provider  Dr. Howard Pouch    Onset Date/Surgical Date  08/28/17    Prior Therapy  for pelvic pain      Precautions   Precautions  None      Restrictions   Weight Bearing Restrictions  No      Balance Screen   Has the patient fallen in the past 6 months  No    Has the patient had a decrease in activity level because of a fear of falling?   No    Is the patient reluctant to leave their home because of a fear of falling?   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      Cognition   Overall Cognitive Status  Within Functional Limits for tasks assessed      Observation/Other Assessments   Skin Integrity  scar on lower abdomen is tight with decreased mobility and puckered    Focus on Therapeutic Outcomes (FOTO)   limited by 40% due to urinary leakage and pain with intercourse      Sensation   Light Touch  Impaired Detail pain with touch on the vulva      Posture/Postural Control   Posture/Postural Control  Postural limitations    Postural Limitations  Decreased lumbar lordosis;Forward head;Rounded Shoulders;Increased thoracic kyphosis    Posture Comments  Lt ASIS higher in supine, medial maleoli      ROM / Strength   AROM / PROM / Strength  AROM;PROM;Strength      AROM   Lumbar Flexion  to floor    Lumbar Extension  50% limited with discomfort      PROM   Right Hip External Rotation   65    Left Hip External Rotation   30      Strength   Overall Strength Comments   abdominal 2/5 iwth difficulty with lower abdominal contraction    Right/Left Hip  Right;Left    Right Hip Flexion  5/5    Right Hip Extension  3/5    Right Hip ABduction  4-/5    Left Hip Flexion  4+/5    Left Hip Extension  3/5    Left Hip ABduction  4/5      Palpation   SI assessment   left ilium is posteriorly rotated    Palpation comment  abdominal are tight             Objective measurements completed on examination: See above findings.    Pelvic Floor Special Questions - 09/27/17 0001    Prior Pregnancies  Yes    Number of Vaginal Deliveries  1 32 weeks    Any difficulty with labor and deliveries  Yes bed rest and did cercalvgial    Diastasis Recti  2 fingers width above and below umbilicus    Currently Sexually Active  Yes    Is this Painful  Yes    Marinoff Scale  pain prevents any attempts at intercourse vaginally    Urinary Leakage  Yes    Pad use  -- unable to use a tampon due to pain    Activities that cause leaking  Bending;Lifting    Skin Integrity  Intact dry    External Palpation  pain in vulva with touch and unable to place gloved index finger further into the vaginal canal due to the pain    Pelvic Floor Internal Exam  Patient cofirms identification and approves PT to assess muslce integrity and treatment    Exam Type  Vaginal    Palpation  only able to touch the vulva area due to pain and discomfort with reaction to hold breath    Strength  Flicker    Tone  hypertonic               PT Education - 09/27/17 1137    Education provided  Yes    Education Details  pelvic floor meditation; vaginal lubricants; gave patient samples of lubricants    Person(s) Educated  Patient    Methods  Explanation;Demonstration;Verbal cues;Handout    Comprehension  Returned demonstration;Verbalized understanding       PT Short Term Goals - 09/27/17 1357      PT SHORT TERM GOAL #1   Title  independent with pelvic floor meditation to relax the muscles     Time  4    Period  Weeks    Status  New    Target Date  10/25/17      PT SHORT TERM GOAL #2   Title  ability to demonstrate correct posture in sitting and standing instead of being flexed     Time  4    Period  Weeks    Status  New    Target Date  10/25/17      PT SHORT TERM GOAL #3   Title  ability to contract the lower abdominal muscles so she will be able to contract the pelvic floor muscles in a circular fashion    Time  4    Period  Weeks    Status  New    Target Date  10/25/17      PT SHORT TERM GOAL #4   Title  improved left hip ER to >/= 60 degrees passively    Time  4    Period  Weeks    Status  New    Target Date  10/25/17        PT Long Term Goals - 09/27/17 1402      Additional Long Term Goals   Additional Long Term Goals  Yes      PT LONG TERM GOAL #6   Title  pain with intercourse decreased >/= 50% due to ability to relax the pelvic floor and understand vaginal health    Time  8    Period  Weeks    Status  New    Target Date  11/22/17      PT LONG TERM GOAL #7   Title  urinary leakage decreased >/= 75% due to improve circular contraction of pelvic floor and increased strength    Time  8    Period  Weeks    Status  New    Target Date  11/22/17      PT LONG TERM GOAL #8   Title  understand how to use moisturizers and lubricants for the vaginal area for good vaginal health    Time  8    Period  Weeks    Status  New    Target Date  11/22/17             Plan - 09/27/17 1139    Clinical Impression Statement  Patient is a 47 year old female with urinary leakage and dyspeuria.  Urinary leakage started on Christmas.  She leaks with bending and lifting. Patient has decreased mobiity of her apendectomy scar in lower abdomen suprapubically. Patient reports vaginal pain with penial penetration at level 8/10 with Marinoff scale 2-3/3. Left hip PROM for ER is 30 degrees and right is 65 degrees.  patient has weakness in hip abductors and extension.   Patient has a diastasis recti 2 fingers width above and blow the umbilicus.  Patient has tightness in the abdomal muscles.  Abdominal strength is 2/5 with decreased contraction of the lower abdomen.  Pelvic floor strength is 1/5.  Pain with touching the vulva, dryness and atrophy noted. Therapist was only able to place the index finger to the vulva due to pain.  Patient will benefit from skilled therapy  to improve pelvic floor function to reduce urinary leakage and reduce pain for penile penetration.     History and Personal Factors relevant to plan of care:  none    Clinical Presentation  Stable    Clinical Presentation due to:  stable condtion    Clinical Decision Making  Low    Rehab Potential  Excellent    Clinical Impairments Affecting Rehab Potential  None    PT Frequency  1x / week    PT Duration  8 weeks    PT Treatment/Interventions  Biofeedback;Cryotherapy;Electrical Stimulation;Moist Heat;Ultrasound;Therapeutic activities;Therapeutic exercise;Patient/family education;Neuromuscular re-education;Manual techniques;Scar mobilization;Passive range of motion;Dry needling    PT Home Exercise Plan  Progress as needed    Consulted and Agree with Plan of Care  Patient       Patient will benefit from skilled therapeutic intervention in order to improve the following deficits and impairments:  Pain, Increased fascial restricitons, Decreased coordination, Decreased mobility, Increased muscle spasms, Decreased scar mobility, Decreased endurance, Decreased range of motion, Decreased strength, Decreased activity tolerance  Visit Diagnosis: Other muscle spasm - Plan: PT plan of care cert/re-cert  Muscle weakness (generalized) - Plan: PT plan of care cert/re-cert  Cramp and spasm - Plan: PT plan of care cert/re-cert     Problem List Patient Active Problem List   Diagnosis Date Noted  . Spondylosis of lumbosacral region without myelopathy or radiculopathy 09/07/2017  . Generalized abdominal  pain 10/11/2016  . Dyspareunia in female 08/25/2015  . Irregular menses 04/16/2013  . Vitamin D deficiency 04/16/2013  . H/O: hypothyroidism 11/14/2012    Earlie Counts, PT 09/27/17 2:09 PM   [facility] [address], [place], [place], [address] Phone: [phone]   Fax:  [phone]  Name: Jamie Miller MRN: [id number] Date of Birth: [birth year]

## 2017-09-27 NOTE — Patient Instructions (Signed)
  Lubrication . Used for intercourse to reduce friction . Avoid ones that have glycerin, warming gels, tingling gels, icing or cooling gel, scented . Avoid parabens due to a preservative similar to female sex hormone . May need to be reapplied once or several times during sexual activity . Can be applied to both partners genitals prior to vaginal penetration to minimize friction or irritation . Prevent irritation and mucosal tears that cause post coital pain and increased the risk of vaginal and urinary tract infections . Oil-based lubricants cannot be used with condoms due to breaking them down.  Least likely to irritate vaginal tissue.  . Plant based-lubes are safe . Silicone-based lubrication are thicker and last long and used for post-menopausal women  Vaginal Lubricators Here is a list of some suggested lubricators you can use for intercourse. Use the most hypoallergenic product.  You can place on you or your partner.   Slippery Stuff  Sylk or Sliquid Natural H2O ( good  if frequent UTI's)  Blossom Organics (www.blossom-organics.com)  Luvena   Coconut oil  PJur Woman Nude- water based lubricant, amazon  Uberlube- Amazon  Aloe Vera  Yes lubricant- [employer] Platinum-Silicone, [employer], [facility]  Olive and Bee intimate cream-  www.oliveandbee.com.au Things to avoid in lubricants are glycerin, warming gels, tingling gels, icing or cooling  gels, and scented gels.  Also avoid Vaseline. KY jelly, Replens, and Astroglide kills good bacteria(lactobacilli)  Things to avoid in the vaginal area . Do not use things to irritate the vulvar area . No lotions- see below . No soaps; can use Aveeno or Calendula cleanser if needed. Must be gentle . No deodorants . No douches . Good to sleep without underwear to let the vaginal area to air out . No scrubbing: spread the lips to let warm water rinse over labias and pat dry  Creams that can be used on the [place] Releveum or [place] for Pelvic Floor Relaxation  FemFusion Fitness   FemFusion Fitness You tube do daily  [employer] [address], [place], [place] [address] Phone # [phone] Fax [phone]

## 2017-10-01 ENCOUNTER — Encounter: Payer: 59 | Admitting: Physical Therapy

## 2017-10-02 ENCOUNTER — Encounter: Payer: Self-pay | Admitting: Physical Therapy

## 2017-10-02 ENCOUNTER — Ambulatory Visit: Payer: 59 | Admitting: Physical Therapy

## 2017-10-02 DIAGNOSIS — R293 Abnormal posture: Secondary | ICD-10-CM

## 2017-10-02 DIAGNOSIS — R252 Cramp and spasm: Secondary | ICD-10-CM

## 2017-10-02 DIAGNOSIS — M6281 Muscle weakness (generalized): Secondary | ICD-10-CM

## 2017-10-02 DIAGNOSIS — M62838 Other muscle spasm: Secondary | ICD-10-CM | POA: Diagnosis not present

## 2017-10-02 NOTE — Therapy (Signed)
[facility] [address], [place], [place], [address] Phone: [phone]   Fax:  [phone]  Physical Therapy Treatment  Patient Details  Name: Jamie Miller MRN: [id number] Date of Birth: 03-29-1971 Referring Provider: Dr. Howard Pouch   Encounter Date: 10/02/2017  PT End of Session - 10/02/17 1107    Visit Number  4    Number of Visits  12    Date for PT Re-Evaluation  11/22/17    Authorization Type  UHC    PT Start Time  1100    PT Stop Time  1140    PT Time Calculation (min)  40 min    Activity Tolerance  Patient tolerated treatment well    Behavior During Therapy  [facility] for tasks assessed/performed       Past Medical History:  Diagnosis Date  . Allergy   . Anxiety   . Asthma   . Chicken pox   . OAB (overactive bladder)   . Thyroid disease    was on low dose synthroid at one time, but removed and levels have been "normal" since and followed thorugh GYN  . Vitamin D deficiency     Past Surgical History:  Procedure Laterality Date  . APPENDECTOMY  1990  . BREAST BIOPSY  2000   biopsy  . CERVICAL CERCLAGE  2009  . HYMENECTOMY  2005  . WISDOM TOOTH EXTRACTION      There were no vitals filed for this visit.  Subjective Assessment - 10/02/17 1103    Subjective  I felt good after last session.  The slippery stuff felt fine.     Pertinent History  PT for urinary incontinence 2009.      Diagnostic tests  x-rays of spine degenerative changes    Patient Stated Goals  not worry about urinary leakage, decreased pain with intercourse    Pain Score  9  vaginal penetration    Pain Location  Vagina    Pain Orientation  Mid    Pain Descriptors / Indicators  Penetrating    Pain Type  Chronic pain    Pain Radiating Towards  does have numbness in the lateral left thigh with rubbing    Pain Onset  More than a month ago    Pain Frequency  Constant    Aggravating Factors   intercourse with him on top and bladder  area constant    Pain Relieving Factors  no intercourse vaginally    Multiple Pain Sites  No                      [facility] Adult PT Treatment/Exercise - 10/02/17 0001      Neuro Re-ed    Neuro Re-ed Details   roll ball under feet to relax the pelvic floor      Lumbar Exercises: Stretches   Quadruped Mid Back Stretch  3 reps all three ways    Other Lumbar Stretch Exercise  sitting hip abduction       Lumbar Exercises: Quadruped   Madcat/Old Horse  15 reps needs many verbal and tactile cues     Madcat/Old Horse Limitations  trouble coordinating      Manual Therapy   Manual Therapy  Myofascial release;Soft tissue mobilization    Soft tissue mobilization  transverse abdominus, obliques, lower abdominal, scar massage, abdominal massage, bilateral psoas release    Myofascial Release  diaphgram release and urogenital diaphgram release wtih on hand on top and  other on bottom; around the umbilicus             PT Education - 10/02/17 1140    Education provided  Yes    Education Details  abdominal massage; lumbar stretches    Person(s) Educated  Patient    Methods  Explanation;Demonstration;Verbal cues;Handout    Comprehension  Returned demonstration;Verbalized understanding       PT Short Term Goals - 10/02/17 1154      PT SHORT TERM GOAL #1   Title  independent with pelvic floor meditation to relax the muscles    Time  4    Period  Weeks    Status  Achieved      PT SHORT TERM GOAL #2   Title  ability to demonstrate correct posture in sitting and standing instead of being flexed     Time  4    Period  Weeks    Status  On-going      PT SHORT TERM GOAL #3   Title  ability to contract the lower abdominal muscles so she will be able to contract the pelvic floor muscles in a circular fashion    Time  4    Period  Weeks    Status  New      PT SHORT TERM GOAL #4   Title  improved left hip ER to >/= 60 degrees passively    Time  4    Period  Weeks    Status   New        PT Long Term Goals - 09/27/17 1402      Additional Long Term Goals   Additional Long Term Goals  Yes      PT LONG TERM GOAL #6   Title  pain with intercourse decreased >/= 50% due to ability to relax the pelvic floor and understand vaginal health    Time  8    Period  Weeks    Status  New    Target Date  11/22/17      PT LONG TERM GOAL #7   Title  urinary leakage decreased >/= 75% due to improve circular contraction of pelvic floor and increased strength    Time  8    Period  Weeks    Status  New    Target Date  11/22/17      PT LONG TERM GOAL #8   Title  understand how to use moisturizers and lubricants for the vaginal area for good vaginal health    Time  8    Period  Weeks    Status  New    Target Date  11/22/17            Plan - 10/02/17 1108    Clinical Impression Statement  Patient has difficulty with coordinating lumbar extension and flexion with cat/cow exercise due to tightness in lumbar and abdominals.  Patient has tightness in the lower abdominal scar and making the lower abdominal tissue tight.  Patient needed many verbal cues to do her stretches.  Patient will benefit from skilled therapy to  improve tissue mobility and pelvic pain.      Rehab Potential  Excellent    Clinical Impairments Affecting Rehab Potential  None    PT Frequency  1x / week    PT Duration  8 weeks    PT Treatment/Interventions  Biofeedback;Cryotherapy;Electrical Stimulation;Moist Heat;Ultrasound;Therapeutic activities;Therapeutic exercise;Patient/family education;Neuromuscular re-education;Manual techniques;Scar mobilization;Passive range of motion;Dry needling    PT Next Visit Plan  work on abdominal tissue, retraining the lower abdominal contraction, tissue rolling of the thighs, diaphgramatic breathing    PT Home Exercise Plan  Progress as needed    Recommended Other Services  both notes are signed by MD    Consulted and Agree with Plan of Care  Patient       Patient  will benefit from skilled therapeutic intervention in order to improve the following deficits and impairments:  Pain, Increased fascial restricitons, Decreased coordination, Decreased mobility, Increased muscle spasms, Decreased scar mobility, Decreased endurance, Decreased range of motion, Decreased strength, Decreased activity tolerance  Visit Diagnosis: Other muscle spasm  Muscle weakness (generalized)  Cramp and spasm  Abnormal posture     Problem List Patient Active Problem List   Diagnosis Date Noted  . Spondylosis of lumbosacral region without myelopathy or radiculopathy 09/07/2017  . Generalized abdominal pain 10/11/2016  . Dyspareunia in female 08/25/2015  . Irregular menses 04/16/2013  . Vitamin D deficiency 04/16/2013  . H/O: hypothyroidism 11/14/2012    Earlie Counts, PT 10/02/17 11:55 AM   [facility] [address], [place], [place], [address] Phone: [phone]   Fax:  [phone]  Name: Jamie Miller MRN: [id number] Date of Birth: 02-Jun-1971

## 2017-10-02 NOTE — Patient Instructions (Addendum)
Piriformis Stretch, Sitting    Sit, one ankle on opposite knee, same-side hand on crossed knee. Push down on knee, keeping spine straight. Lean torso forward, with flat back, until tension is felt in hamstrings and gluteals of crossed-leg side. Hold _30__ seconds.  Repeat __2_ times per session. Do __1_ sessions per day.  Copyright  VHI. All rights reserved.  Adductors, Sitting With Hip Flexion    Sit with legs open in a wide V, toes pointing up, hands on knees. Keep spine straight supporting trunk with arms. Slide arms down leg as trunk tips forward. Press knees apart. Hold __30_ seconds. Repeat _2__ times per session. Do __1_ sessions per day.  Copyright  VHI. All rights reserved.  Mid-Back Stretch    Push chest toward floor, reaching forward as far as possible. Hold __30__ seconds. Repeat __2__ times per set. Do _1___ sets per session. Do _1___ sessions per day.  http://orth.exer.us/131   Copyright  VHI. All rights reserved.  About Abdominal Massage  Abdominal massage, also called external colon massage, is a self-treatment circular massage technique that can reduce and eliminate gas and ease constipation. The colon naturally contracts in waves in a clockwise direction starting from inside the right hip, moving up toward the ribs, across the belly, and down inside the left hip.  When you perform circular abdominal massage, you help stimulate your colon's normal wave pattern of movement called peristalsis.  It is most beneficial when done after eating.  Positioning You can practice abdominal massage with oil while lying down, or in the shower with soap.  Some people find that it is just as effective to do the massage through clothing while sitting or standing.  How to Massage Start by placing your finger tips or knuckles on your right side, just inside your hip bone.  . Make small circular movements while you move upward toward your rib cage.   . Once you reach the bottom  right side of your rib cage, take your circular movements across to the left side of the bottom of your rib cage.  . Next, move downward until you reach the inside of your left hip bone.  This is the path your feces travel in your colon. . Continue to perform your abdominal massage in this pattern for 3 minutes each day.     You can apply as much pressure as is comfortable in your massage.  Start gently and build pressure as you continue to practice.  Notice any areas of pain as you massage; areas of slight pain may be relieved as you massage, but if you have areas of significant or intense pain, consult with your healthcare provider.  Other Considerations . General physical activity including bending and stretching can have a beneficial massage-like effect on the colon.  Deep breathing can also stimulate the colon because breathing deeply activates the same nervous system that supplies the colon.   . Abdominal massage should always be used in combination with a bowel-conscious diet that is high in the proper type of fiber for you, fluids (primarily water), and a regular exercise program. [facility] [address] [place], [place] [address] Phone # [phone] Fax [phone]

## 2017-10-04 ENCOUNTER — Encounter: Payer: Self-pay | Admitting: Physical Therapy

## 2017-10-04 ENCOUNTER — Ambulatory Visit: Payer: 59 | Admitting: Physical Therapy

## 2017-10-04 DIAGNOSIS — M5442 Lumbago with sciatica, left side: Secondary | ICD-10-CM

## 2017-10-04 DIAGNOSIS — G8929 Other chronic pain: Secondary | ICD-10-CM

## 2017-10-04 DIAGNOSIS — M6281 Muscle weakness (generalized): Secondary | ICD-10-CM

## 2017-10-04 DIAGNOSIS — R252 Cramp and spasm: Secondary | ICD-10-CM

## 2017-10-04 DIAGNOSIS — R293 Abnormal posture: Secondary | ICD-10-CM

## 2017-10-04 DIAGNOSIS — M62838 Other muscle spasm: Secondary | ICD-10-CM

## 2017-10-04 NOTE — Therapy (Signed)
[facility] [place], [place], [address] Phone: [phone]   Fax:  [phone]  Physical Therapy Treatment  Patient Details  Name: Jamie Miller MRN: [id number] Date of Birth: 15-Nov-1970 Referring Provider: Dr. Howard Pouch   Encounter Date: 10/04/2017  PT End of Session - 10/04/17 0849    Visit Number  5    Number of Visits  12    Date for PT Re-Evaluation  11/22/17    Authorization Type  UHC    PT Start Time  0849    PT Stop Time  0942    PT Time Calculation (min)  53 min    Activity Tolerance  Patient tolerated treatment well       Past Medical History:  Diagnosis Date  . Allergy   . Anxiety   . Asthma   . Chicken pox   . OAB (overactive bladder)   . Thyroid disease    was on low dose synthroid at one time, but removed and levels have been "normal" since and followed thorugh GYN  . Vitamin D deficiency     Past Surgical History:  Procedure Laterality Date  . APPENDECTOMY  1990  . BREAST BIOPSY  2000   biopsy  . CERVICAL CERCLAGE  2009  . HYMENECTOMY  2005  . WISDOM TOOTH EXTRACTION      There were no vitals filed for this visit.  Subjective Assessment - 10/04/17 0852    Subjective  Pt reports her back felt sore for about a day after her last treatment with me.     Patient Stated Goals  not worry about urinary leakage, decreased pain with intercourse    Currently in Pain?  Yes    Pain Score  4     Pain Location  Back    Pain Orientation  Mid    Pain Descriptors / Indicators  Pressure    Pain Type  Chronic pain    Pain Frequency  Intermittent    Aggravating Factors   no pain initially with therapy, had slight with nustep    Pain Relieving Factors  rest and stretch                      [facility] Adult PT Treatment/Exercise - 10/04/17 0001      Lumbar Exercises: Stretches   Passive Hamstring Stretch  Left;Right;30 seconds with strap    Single Knee to Chest Stretch   Left;Right;30 seconds    Double Knee to Chest Stretch  30 seconds    Lower Trunk Rotation  30 seconds each side    ITB Stretch  Left;Right;30 seconds with strap      Lumbar Exercises: Aerobic   Nustep  L4x5'      Lumbar Exercises: Supine   Bridge  20 reps VC to relax upper body    Isometric Hip Flexion  10 reps;5 seconds    Other Supine Lumbar Exercises  in hooklying using abdominal stabilizer to work on training her deep core muscle to dissassociate her pelvis. She has a lot of difficulty with this.  Performed in hooklying and with legs elevated.  Was able to have some pelvis movement with bilat toe tapping.       Lumbar Exercises: Prone   Straight Leg Raise  10 reps each with leg straight and knee bent with pelvic press    Other Prone Lumbar Exercises  VC for form to protect neck and focus work on back.  Modalities   Modalities  Moist Heat      Moist Heat Therapy   Number Minutes Moist Heat  15 Minutes    Moist Heat Location  Lumbar Spine abdomen               PT Short Term Goals - 10/02/17 1154      PT SHORT TERM GOAL #1   Title  independent with pelvic floor meditation to relax the muscles    Time  4    Period  Weeks    Status  Achieved      PT SHORT TERM GOAL #2   Title  ability to demonstrate correct posture in sitting and standing instead of being flexed     Time  4    Period  Weeks    Status  On-going      PT SHORT TERM GOAL #3   Title  ability to contract the lower abdominal muscles so she will be able to contract the pelvic floor muscles in a circular fashion    Time  4    Period  Weeks    Status  New      PT SHORT TERM GOAL #4   Title  improved left hip ER to >/= 60 degrees passively    Time  4    Period  Weeks    Status  New        PT Long Term Goals - 10/04/17 0900      PT LONG TERM GOAL #1   Title  I with advanced HEP for core stability (11/02/17)     Status  On-going      PT LONG TERM GOAL #2   Title  report =/> 75% reduction of  Lt LE symptoms with walking ( 11/02/17)     Status  On-going      PT LONG TERM GOAL #3   Title  improve FOTO =/< 29% limited ( 11/02/17)     Status  On-going      PT LONG TERM GOAL #4   Title  maintain level pelvic, not Lt ilium rotation =/> 1 wk (11/02/17)     Status  On-going      PT LONG TERM GOAL #5   Title  demo bilat hip strength =/> 5-/5 to allow her to start a walking program (11/02/17)     Status  On-going            Plan - 10/04/17 0915    Clinical Impression Statement  Jamie Miller has some anxiety while performing her exercise causing her to tense through her upper body.  Required frequent cues to relax and release these muscles.  She has multiple restrictions in soft tissue of the lower body.  She is beginning to get the movement in her pelvis, fatigues quickly. No goals met at this time.     Rehab Potential  Excellent    PT Frequency  1x / week    PT Duration  8 weeks    PT Treatment/Interventions  Biofeedback;Cryotherapy;Electrical Stimulation;Moist Heat;Ultrasound;Therapeutic activities;Therapeutic exercise;Patient/family education;Neuromuscular re-education;Manual techniques;Scar mobilization;Passive range of motion;Dry needling    PT Next Visit Plan  work on deep core muscle activation along with pelvic movement/spinal     Consulted and Agree with Plan of Care  Patient       Patient will benefit from skilled therapeutic intervention in order to improve the following deficits and impairments:  Pain, Increased fascial restricitons, Decreased coordination, Decreased mobility, Increased muscle spasms, Decreased scar  mobility, Decreased endurance, Decreased range of motion, Decreased strength, Decreased activity tolerance  Visit Diagnosis: Other muscle spasm  Muscle weakness (generalized)  Abnormal posture  Chronic left-sided low back pain with left-sided sciatica  Cramp and spasm     Problem List Patient Active Problem List   Diagnosis Date Noted  . Spondylosis of  lumbosacral region without myelopathy or radiculopathy 09/07/2017  . Generalized abdominal pain 10/11/2016  . Dyspareunia in female 08/25/2015  . Irregular menses 04/16/2013  . Vitamin D deficiency 04/16/2013  . H/O: hypothyroidism 11/14/2012    Jeral Pinch PT 10/04/2017, 9:29 AM  [facility] [place], [place], [address] Phone: [phone]   Fax:  [phone]  Name: Jamie Miller MRN: [id number] Date of Birth: 02/09/1971

## 2017-10-05 DIAGNOSIS — N3289 Other specified disorders of bladder: Secondary | ICD-10-CM

## 2017-10-05 HISTORY — DX: Other specified disorders of bladder: N32.89

## 2017-10-10 ENCOUNTER — Encounter: Payer: Self-pay | Admitting: Physical Therapy

## 2017-10-10 ENCOUNTER — Ambulatory Visit: Payer: 59 | Attending: Family Medicine | Admitting: Physical Therapy

## 2017-10-10 DIAGNOSIS — M6281 Muscle weakness (generalized): Secondary | ICD-10-CM | POA: Diagnosis present

## 2017-10-10 DIAGNOSIS — M62838 Other muscle spasm: Secondary | ICD-10-CM | POA: Diagnosis not present

## 2017-10-10 DIAGNOSIS — R293 Abnormal posture: Secondary | ICD-10-CM | POA: Diagnosis not present

## 2017-10-10 DIAGNOSIS — R252 Cramp and spasm: Secondary | ICD-10-CM

## 2017-10-10 NOTE — Patient Instructions (Addendum)
Make circular massage around the vulva with underwear off for 2-3 minutes daily. Do the stomach at the same time  Continue the meditation daily and stretches Continues to use the ball on both feet daily  Diaphragmatic Breathing - Supine    Hands on top of navel, lips closed, breathe in through nose filling stomach with air, navel moves out toward hands. Exhale through puckered lips, hands follow exhalation in. Repeat _10__ times. Rest _1__ seconds between repeats. Do __2_ times per day.  Copyright  VHI. All rights reserved.  Earlie Counts, PT [facility] [address] [place], [place] [address] Phone # [phone] Fax [phone]

## 2017-10-10 NOTE — Therapy (Signed)
[facility] [address] [place], [place], [address] Phone: [phone]   Fax:  [phone]  Physical Therapy Treatment  Patient Details  Name: OBIE SILOS MRN: [id number] Date of Birth: 01-Jun-1971 Referring Provider: Dr. Howard Pouch   Encounter Date: 10/10/2017  PT End of Session - 10/10/17 0959    Visit Number  6    Date for PT Re-Evaluation  11/22/17    Authorization Type  [employer]    PT Start Time  0930    PT Stop Time  1010    PT Time Calculation (min)  40 min    Activity Tolerance  Patient tolerated treatment well    Behavior During Therapy  [facility] for tasks assessed/performed       Past Medical History:  Diagnosis Date  . Allergy   . Anxiety   . Asthma   . Chicken pox   . OAB (overactive bladder)   . Thyroid disease    was on low dose synthroid at one time, but removed and levels have been "normal" since and followed thorugh GYN  . Vitamin D deficiency     Past Surgical History:  Procedure Laterality Date  . APPENDECTOMY  1990  . BREAST BIOPSY  2000   biopsy  . CERVICAL CERCLAGE  2009  . HYMENECTOMY  2005  . WISDOM TOOTH EXTRACTION      There were no vitals filed for this visit.  Subjective Assessment - 10/10/17 0934    Subjective  I feel the exercise in my neck so I am not doing it right. I am ot having urinary leakage and I have the constant urge to go.  the urinary urgency is not on my mind as much.  When I bend down I do not have to hold my perineum as much.     Pertinent History  PT for urinary incontinence 2009.      Diagnostic tests  x-rays of spine degenerative changes    Patient Stated Goals  not worry about urinary leakage, decreased pain with intercourse    Currently in Pain?  Yes    Pain Score  3     Pain Location  Pelvis    Pain Orientation  Lower    Pain Descriptors / Indicators  -- have to use the rest room    Pain Type  Chronic pain    Pain Onset  More than a month ago    Pain  Frequency  Constant    Aggravating Factors   i feel like i have to use the rest room constantly    Pain Relieving Factors  trying to use the rest room    Multiple Pain Sites  No                      [facility] Adult PT Treatment/Exercise - 10/10/17 0001      Self-Care   Self-Care  Other Self-Care Comments    Other Self-Care Comments   discussed with patient on it is normal to feel warmth in muscles and how to relax muscles      Neuro Re-ed    Neuro Re-ed Details   diaphragmatic breathing      Manual Therapy   Manual Therapy  Soft tissue mobilization;Myofascial release    Soft tissue mobilization  bil. hip adductors and quads    Myofascial Release  tissue rolling to the thighs  PT Education - 10/10/17 0959    Education provided  Yes    Education Details  diaphgramatic breathing; outside perineal body    Person(s) Educated  Patient    Methods  Explanation;Demonstration;Verbal cues;Handout    Comprehension  Returned demonstration;Verbalized understanding       PT Short Term Goals - 10/10/17 1015      PT SHORT TERM GOAL #1   Title  independent with pelvic floor meditation to relax the muscles    Time  4    Period  Weeks    Status  Achieved      PT SHORT TERM GOAL #2   Title  ability to demonstrate correct posture in sitting and standing instead of being flexed     Time  4    Period  Weeks    Status  On-going      PT SHORT TERM GOAL #3   Title  ability to contract the lower abdominal muscles so she will be able to contract the pelvic floor muscles in a circular fashion    Time  4    Period  Weeks    Status  On-going        PT Long Term Goals - 10/04/17 0900      PT LONG TERM GOAL #1   Title  I with advanced HEP for core stability (11/02/17)     Status  On-going      PT LONG TERM GOAL #2   Title  report =/> 75% reduction of Lt LE symptoms with walking ( 11/02/17)     Status  On-going      PT LONG TERM GOAL #3   Title  improve FOTO =/<  29% limited ( 11/02/17)     Status  On-going      PT LONG TERM GOAL #4   Title  maintain level pelvic, not Lt ilium rotation =/> 1 wk (11/02/17)     Status  On-going      PT LONG TERM GOAL #5   Title  demo bilat hip strength =/> 5-/5 to allow her to start a walking program (11/02/17)     Status  On-going            Plan - 10/10/17 1012    Clinical Impression Statement  Patient has difficulty relaxing and does not understand sensations of relaxing.  Patient has difficulty with diaphragmatic breathing and will place air in her chest.  Patient has infomed patient she had a difficult childhood ad this could contribute to her pelvic floor pain and difficulty relaxing her muscles. Patient needs verbal cues to perform breathing.  Patient wil benefit from skilled Pt to reduce muscel tension, postural awareness, core strength and reduction of tendsion.     Rehab Potential  Excellent    Clinical Impairments Affecting Rehab Potential  None    PT Frequency  1x / week    PT Duration  8 weeks    PT Treatment/Interventions  Biofeedback;Cryotherapy;Electrical Stimulation;Moist Heat;Ultrasound;Therapeutic activities;Therapeutic exercise;Patient/family education;Neuromuscular re-education;Manual techniques;Scar mobilization;Passive range of motion;Dry needling    PT Next Visit Plan  soft tissue work ot legs and pelvic floor; check pelvic alignment; work on lower abdominal strength    PT Home Exercise Plan  Progress as needed    Consulted and Agree with Plan of Care  Patient       Patient will benefit from skilled therapeutic intervention in order to improve the following deficits and impairments:  Pain, Increased fascial restricitons, Decreased coordination, Decreased  mobility, Increased muscle spasms, Decreased scar mobility, Decreased endurance, Decreased range of motion, Decreased strength, Decreased activity tolerance  Visit Diagnosis: Other muscle spasm  Muscle weakness (generalized)  Abnormal  posture  Cramp and spasm     Problem List Patient Active Problem List   Diagnosis Date Noted  . Spondylosis of lumbosacral region without myelopathy or radiculopathy 09/07/2017  . Generalized abdominal pain 10/11/2016  . Dyspareunia in female 08/25/2015  . Irregular menses 04/16/2013  . Vitamin D deficiency 04/16/2013  . H/O: hypothyroidism 11/14/2012    Earlie Counts, PT 10/10/17 10:16 AM   [facility] [address] [place], [place], [address] Phone: [phone]   Fax:  [phone]  Name: LETISIA SCHWALB MRN: [id number] Date of Birth: 06-Aug-1971

## 2017-10-15 ENCOUNTER — Telehealth: Payer: Self-pay | Admitting: Family Medicine

## 2017-10-15 ENCOUNTER — Encounter: Payer: Self-pay | Admitting: Family Medicine

## 2017-10-15 MED ORDER — OSELTAMIVIR PHOSPHATE 75 MG PO CAPS: 75.0000 mg | ORAL_CAPSULE | Freq: Two times a day (BID) | ORAL | 0 refills | Status: DC

## 2017-10-15 NOTE — Telephone Encounter (Signed)
tamiflu prescribed

## 2017-10-16 ENCOUNTER — Encounter: Payer: 59 | Admitting: Physical Therapy

## 2017-10-16 NOTE — Telephone Encounter (Signed)
Sent message in [facility] Chart yesterday

## 2017-10-16 NOTE — Telephone Encounter (Signed)
Please inform pt if you have not already. This was sent yesterday and I do not see pt was told.

## 2017-10-18 ENCOUNTER — Ambulatory Visit: Payer: 59 | Admitting: Physical Therapy

## 2017-10-23 ENCOUNTER — Ambulatory Visit: Payer: 59 | Admitting: Physical Therapy

## 2017-10-23 DIAGNOSIS — G8929 Other chronic pain: Secondary | ICD-10-CM | POA: Diagnosis not present

## 2017-10-23 DIAGNOSIS — M6281 Muscle weakness (generalized): Secondary | ICD-10-CM

## 2017-10-23 DIAGNOSIS — M5442 Lumbago with sciatica, left side: Secondary | ICD-10-CM | POA: Diagnosis not present

## 2017-10-23 DIAGNOSIS — M62838 Other muscle spasm: Secondary | ICD-10-CM

## 2017-10-23 DIAGNOSIS — R293 Abnormal posture: Secondary | ICD-10-CM | POA: Diagnosis not present

## 2017-10-23 NOTE — Therapy (Signed)
[place], [place], [address] Phone: [phone]   Fax:  [phone]  Physical Therapy Treatment  Patient Details  Name: Jamie Miller MRN: [id number] Date of Birth: 10/31/1970 Referring Provider: Dr. Howard Pouch   Encounter Date: 10/23/2017  PT End of Session - 10/23/17 1121    Visit Number  7    Number of Visits  12    Date for PT Re-Evaluation  11/22/17    Authorization Type  UHC    PT Start Time  1117    PT Stop Time  1157    PT Time Calculation (min)  40 min    Activity Tolerance  Patient tolerated treatment well       Past Medical History:  Diagnosis Date  . Allergy   . Anxiety   . Asthma   . Chicken pox   . OAB (overactive bladder)   . Thyroid disease    was on low dose synthroid at one time, but removed and levels have been "normal" since and followed thorugh GYN  . Vitamin D deficiency     Past Surgical History:  Procedure Laterality Date  . APPENDECTOMY  1990  . BREAST BIOPSY  2000   biopsy  . CERVICAL CERCLAGE  2009  . HYMENECTOMY  2005  . WISDOM TOOTH EXTRACTION      There were no vitals filed for this visit.  Subjective Assessment - 10/23/17 1115    Subjective  Pt reports the numbness in the lower Lt LE is less. Minimal to no back pain    Pain Score  2  this AM now its gone.     Aggravating Factors   no consistency,          [facility] PT Assessment - 10/23/17 0001      Assessment   Medical Diagnosis  N393.3 Stress incontinence of urine/lumbar radiculopathy      Strength   Right Hip Flexion  5/5    Right Hip Extension  4-/5    Right Hip ABduction  4+/5    Left Hip Flexion  -- 5-/5    Left Hip Extension  4/5    Left Hip ABduction  4+/5                  [facility] Adult PT Treatment/Exercise - 10/23/17 0001      Lumbar Exercises: Stretches   Active Hamstring Stretch  Right;Left;30 seconds with strap and opposite leg presses down.     Single Knee to  Chest Stretch  Left;Right;30 seconds    Double Knee to Chest Stretch  30 seconds    Hip Flexor Stretch  30 seconds;Left;Right with leg off EOB    Piriformis Stretch  Left;Right;30 seconds      Lumbar Exercises: Seated   Other Seated Lumbar Exercises  sitting on ball working on pelvic mobility       Lumbar Exercises: Supine   Single Leg Bridge  -- 2x10 figure 4       Lumbar Exercises: Sidelying   Other Sidelying Lumbar Exercises  10 reps each pilates FWD/BWD kicks, CW/CCW circles, FWD/BWD taps  . Multiple VC for form     Lumbar Exercises: Prone   Other Prone Lumbar Exercises  pelvic press, then 5 reps SLR, progressed HEP , 10 reps bent knee hip extension and 5 reps upper body portion of pelvic press series, VC for form and to relax neck.  PT Education - 10/23/17 1129    Education provided  Yes    Education Details  HEP progression for deep core stabilization    Person(s) Educated  Patient    Methods  Explanation;Demonstration;Handout    Comprehension  Verbalized understanding;Returned demonstration       PT Short Term Goals - 10/10/17 1015      PT SHORT TERM GOAL #1   Title  independent with pelvic floor meditation to relax the muscles    Time  4    Period  Weeks    Status  Achieved      PT SHORT TERM GOAL #2   Title  ability to demonstrate correct posture in sitting and standing instead of being flexed     Time  4    Period  Weeks    Status  On-going      PT SHORT TERM GOAL #3   Title  ability to contract the lower abdominal muscles so she will be able to contract the pelvic floor muscles in a circular fashion    Time  4    Period  Weeks    Status  On-going        PT Long Term Goals - 10/23/17 1130      PT LONG TERM GOAL #1   Title  I with advanced HEP for core stability (11/02/17)     Status  On-going      PT LONG TERM GOAL #2   Title  report =/> 75% reduction of Lt LE symptoms with walking ( 11/02/17)     Status  On-going 405 improvement       PT LONG TERM GOAL #3   Title  improve FOTO =/< 29% limited ( 11/02/17)     Status  On-going      PT LONG TERM GOAL #4   Title  maintain level pelvic, not Lt ilium rotation =/> 1 wk (11/02/17)     Status  Achieved      PT LONG TERM GOAL #5   Title  demo bilat hip strength =/> 5-/5 to allow her to start a walking program (11/02/17)     Status  Partially Met            Plan - 10/23/17 1240    Clinical Impression Statement  Duru has not been into therapy in awhile due to illness in her house, She does report that she is feeling alittle better.  She has partailly met her hip strength goal and making slow progress to the others.  Shakaya faigued quickly with hip strengthening and required frequent cues for form.  She appeared to have some improved pelvic mobility while on the physioball.     Rehab Potential  Excellent    PT Frequency  1x / week    PT Duration  8 weeks    PT Treatment/Interventions  Biofeedback;Cryotherapy;Electrical Stimulation;Moist Heat;Ultrasound;Therapeutic activities;Therapeutic exercise;Patient/family education;Neuromuscular re-education;Manual techniques;Scar mobilization;Passive range of motion;Dry needling    PT Next Visit Plan  soft tissue work ot legs and pelvic floor; check pelvic alignment; work on lower abdominal strength    Consulted and Agree with Plan of Care  Patient       Patient will benefit from skilled therapeutic intervention in order to improve the following deficits and impairments:  Pain, Increased fascial restricitons, Decreased coordination, Decreased mobility, Increased muscle spasms, Decreased scar mobility, Decreased endurance, Decreased range of motion, Decreased strength, Decreased activity tolerance  Visit Diagnosis: Other muscle spasm  Muscle weakness (generalized)  Abnormal posture  Chronic left-sided low back pain with left-sided sciatica     Problem List Patient Active Problem List   Diagnosis Date Noted  . Spondylosis of  lumbosacral region without myelopathy or radiculopathy 09/07/2017  . Generalized abdominal pain 10/11/2016  . Dyspareunia in female 08/25/2015  . Irregular menses 04/16/2013  . Vitamin D deficiency 04/16/2013  . H/O: hypothyroidism 11/14/2012    Jeral Pinch PT  10/23/2017, 12:43 PM  [facility] [place], [place], [address] Phone: [phone]   Fax:  [phone]  Name: DEYANNA MCTIER MRN: [id number] Date of Birth: 1971-05-20

## 2017-10-23 NOTE — Patient Instructions (Signed)
Pelvic Press   - series HIP: Extension / KNEE: Flexion - Prone    Hold pelvic press. Bend knee, squeeze glutes. Raise leg up  10___ reps per set, _1__ sets per day, _1__ time a day.   Axial Extension- Upper body sequence -  Don't let your head lead the lift, it should come from the low back.  FOCUS ON THE BACK * always start with pelvic press    Lie on stomach with forehead resting on floor and arms at sides. Tuck chin in and raise head from floor without bending it up or down. Repeat ___5_ times per set. Do __1__ sets per session. Do _1___ sessions per day. Progression:  Arms at side Arms in T shape Arms in W shape ( Goal post position)  Arms in Y shape  ( Superman)   Unilateral Isometric Hip Flexion    Tighten stomach and raise right knee to outstretched arm. Push gently, keeping arm straight, trunk rigid. Hold __5__ seconds. Repeat __10__ times per set. Do __1__ sets per session. Do ___1_ sessions per day. Repeat on the other side.  Then perform with opposite arm.   [place] [facility] [phone] (office) [phone] (fax)

## 2017-10-24 ENCOUNTER — Encounter: Payer: Self-pay | Admitting: Physical Therapy

## 2017-10-24 ENCOUNTER — Ambulatory Visit: Payer: 59 | Admitting: Physical Therapy

## 2017-10-24 DIAGNOSIS — M62838 Other muscle spasm: Secondary | ICD-10-CM | POA: Diagnosis not present

## 2017-10-24 DIAGNOSIS — M6281 Muscle weakness (generalized): Secondary | ICD-10-CM

## 2017-10-24 DIAGNOSIS — R293 Abnormal posture: Secondary | ICD-10-CM

## 2017-10-24 DIAGNOSIS — R252 Cramp and spasm: Secondary | ICD-10-CM

## 2017-10-24 NOTE — Therapy (Signed)
[facility] [address] [place], [place], [address] Phone: [phone]   Fax:  [phone]  Physical Therapy Treatment  Patient Details  Name: Jamie Miller MRN: [id number] Date of Birth: [birth year] Referring Provider: Dr. Howard Pouch   Encounter Date: 10/24/2017  PT End of Session - 10/24/17 0929    Visit Number  8    Number of Visits  12    Date for PT Re-Evaluation  11/22/17    Authorization Type  UHC    PT Start Time  0845    PT Stop Time  0928    PT Time Calculation (min)  43 min    Activity Tolerance  Patient tolerated treatment well    Behavior During Therapy  [facility] for tasks assessed/performed       Past Medical History:  Diagnosis Date  . Allergy   . Anxiety   . Asthma   . Chicken pox   . OAB (overactive bladder)   . Thyroid disease    was on low dose synthroid at one time, but removed and levels have been "normal" since and followed thorugh GYN  . Vitamin D deficiency     Past Surgical History:  Procedure Laterality Date  . APPENDECTOMY  1990  . BREAST BIOPSY  2000   biopsy  . CERVICAL CERCLAGE  2009  . HYMENECTOMY  2005  . WISDOM TOOTH EXTRACTION      There were no vitals filed for this visit.  Subjective Assessment - 10/24/17 0849    Subjective  I have recovered from the flu. Urinary frequency is the same. I am noticing that I am seperated from my pelvic floor. I have not had any urinary leakage.     Pertinent History  PT for urinary incontinence 2009.      Diagnostic tests  x-rays of spine degenerative changes    Patient Stated Goals  not worry about urinary leakage, decreased pain with intercourse    Currently in Pain?  Yes    Pain Score  2     Pain Location  Pelvis    Pain Orientation  Lower    Pain Descriptors / Indicators  Discomfort    Pain Type  Chronic pain    Pain Onset  More than a month ago    Pain Frequency  Constant    Aggravating Factors   no consistency    Pain  Relieving Factors  trying to use the restroom    Multiple Pain Sites  No                      [facility] Adult PT Treatment/Exercise - 10/24/17 0001      Therapeutic Activites    Therapeutic Activities  ADL's standing tall with pressing through her feet      Neuro Re-ed    Neuro Re-ed Details   touching her scar and becoming aware of her body that she would not acknowledge      Manual Therapy   Manual Therapy  Soft tissue mobilization;Myofascial release    Soft tissue mobilization  massage suprapubic bone where the abdominals attach, transverse abdominus, rectus abdominus, circular massage    Myofascial Release  releasing around the lower abdominal scar, release with two hands  along midline of the abdomen, and release around the bladder and umbilicus.        Trigger Point Dry Needling - 10/24/17 0932    Consent Given?  Yes  Education Handout Provided  Yes    Muscles Treated Upper Body  -- abdominals and scar    Muscles Treated Lower Body  -- release and muscle elongation           PT Education - 10/24/17 0928    Education provided  Yes    Education Details  information on dry needling, postural awareness    Person(s) Educated  Patient    Methods  Explanation;Handout;Demonstration    Comprehension  Verbalized understanding;Returned demonstration       PT Short Term Goals - 10/24/17 1017      PT SHORT TERM GOAL #2   Title  ability to demonstrate correct posture in sitting and standing instead of being flexed     Baseline  after therapy she is able to stand taller    Time  4    Period  Weeks    Status  On-going      PT SHORT TERM GOAL #3   Title  ability to contract the lower abdominal muscles so she will be able to contract the pelvic floor muscles in a circular fashion    Time  4    Period  Weeks    Status  On-going      PT SHORT TERM GOAL #4   Title  improved left hip ER to >/= 60 degrees passively    Time  4    Period  Weeks    Status  On-going         PT Long Term Goals - 10/23/17 1130      PT LONG TERM GOAL #1   Title  I with advanced HEP for core stability (11/02/17)     Status  On-going      PT LONG TERM GOAL #2   Title  report =/> 75% reduction of Lt LE symptoms with walking ( 11/02/17)     Status  On-going 405 improvement      PT LONG TERM GOAL #3   Title  improve FOTO =/< 29% limited ( 11/02/17)     Status  On-going      PT LONG TERM GOAL #4   Title  maintain level pelvic, not Lt ilium rotation =/> 1 wk (11/02/17)     Status  Achieved      PT LONG TERM GOAL #5   Title  demo bilat hip strength =/> 5-/5 to allow her to start a walking program (11/02/17)     Status  Partially Met            Plan - 10/24/17 0929    Clinical Impression Statement  Patient is having trouble acknowledging her abdomen and abdominal scar but after today she was able to look at it and touch it.  Patient was having systemic reaction throughout her body with myofascial release with shaking of the arms, feeling nauseated, teeth chatting.  Patient was able to elongate her trunk after therapy due to the release of the abdoment. Patient will benefit from skilled therapy to improve posture, reduce pain and restore function.     Rehab Potential  Excellent    Clinical Impairments Affecting Rehab Potential  None    PT Frequency  1x / week    PT Duration  8 weeks    PT Treatment/Interventions  Biofeedback;Cryotherapy;Electrical Stimulation;Moist Heat;Ultrasound;Therapeutic activities;Therapeutic exercise;Patient/family education;Neuromuscular re-education;Manual techniques;Scar mobilization;Passive range of motion;Dry needling    PT Next Visit Plan  soft tissue work ot legs and pelvic floor; check pelvic alignment; work on  lower abdominal strength; sry needling    PT Home Exercise Plan  Progress as needed    Consulted and Agree with Plan of Care  Patient       Patient will benefit from skilled therapeutic intervention in order to improve the following  deficits and impairments:  Pain, Increased fascial restricitons, Decreased coordination, Decreased mobility, Increased muscle spasms, Decreased scar mobility, Decreased endurance, Decreased range of motion, Decreased strength, Decreased activity tolerance  Visit Diagnosis: Other muscle spasm  Muscle weakness (generalized)  Abnormal posture  Cramp and spasm     Problem List Patient Active Problem List   Diagnosis Date Noted  . Spondylosis of lumbosacral region without myelopathy or radiculopathy 09/07/2017  . Generalized abdominal pain 10/11/2016  . Dyspareunia in female 08/25/2015  . Irregular menses 04/16/2013  . Vitamin D deficiency 04/16/2013  . H/O: hypothyroidism 11/14/2012    Earlie Counts, PT 10/24/17 10:19 AM   [facility] [address] [place], [place], [address] Phone: [phone]   Fax:  [phone]  Name: IMBERLY TROXLER MRN: [id number] Date of Birth: Jun 06, 1971

## 2017-10-24 NOTE — Patient Instructions (Signed)

## 2017-10-31 ENCOUNTER — Ambulatory Visit: Payer: 59 | Admitting: Physical Therapy

## 2017-10-31 DIAGNOSIS — M6281 Muscle weakness (generalized): Secondary | ICD-10-CM | POA: Diagnosis not present

## 2017-10-31 DIAGNOSIS — R293 Abnormal posture: Secondary | ICD-10-CM | POA: Diagnosis not present

## 2017-10-31 DIAGNOSIS — M5442 Lumbago with sciatica, left side: Secondary | ICD-10-CM

## 2017-10-31 DIAGNOSIS — M62838 Other muscle spasm: Secondary | ICD-10-CM

## 2017-10-31 DIAGNOSIS — G8929 Other chronic pain: Secondary | ICD-10-CM

## 2017-10-31 NOTE — Therapy (Signed)
[facility] [address] [place], [place], [address] Phone: [phone]   Fax:  [phone]  Physical Therapy Treatment  Patient Details  Name: Jamie Miller MRN: [id number] Date of Birth: 28-Jun-1971 Referring Provider: Dr. Felix Pacini   Encounter Date: 10/31/2017  PT End of Session - 10/31/17 1021    Visit Number  9    Number of Visits  12    Date for PT Re-Evaluation  11/22/17    Authorization Type  [employer]    PT Start Time  1021 heat at end    PT Stop Time  1106    PT Time Calculation (min)  45 min       Past Medical History:  Diagnosis Date  . Allergy   . Anxiety   . Asthma   . Chicken pox   . OAB (overactive bladder)   . Thyroid disease    was on low dose synthroid at one time, but removed and levels have been "normal" since and followed thorugh GYN  . Vitamin D deficiency     Past Surgical History:  Procedure Laterality Date  . APPENDECTOMY  1990  . BREAST BIOPSY  2000   biopsy  . CERVICAL CERCLAGE  2009  . HYMENECTOMY  2005  . WISDOM TOOTH EXTRACTION      There were no vitals filed for this visit.  Subjective Assessment - 10/31/17 1021    Subjective  Jamie Miller reports that her back went out on Sunday - had a spasm - used heat and that helped settle it down,  Today the low back is still store. Yesterday when performing her HEP had trouble with superman    Currently in Pain?  Yes    Pain Score  4     Pain Location  Back    Pain Orientation  Left;Right    Pain Descriptors / Indicators  Discomfort    Pain Type  Chronic pain    Pain Onset  More than a month ago    Pain Frequency  Constant    Aggravating Factors   bending wrong    Pain Relieving Factors  heat         [facility] PT Assessment - 10/31/17 0001      Assessment   Medical Diagnosis  N393.3 Stress incontinence of urine/lumbar radiculopathy                  [facility] Adult PT Treatment/Exercise - 10/31/17 0001      Lumbar Exercises:  Stretches   Hip Flexor Stretch  Left;Right;30 seconds strap to flex knee    Figure 4 Stretch  1 rep;20 seconds;Supine;With overpressure bilat      Lumbar Exercises: Seated   Other Seated Lumbar Exercises  sitting on blue disc, marching, LAQ, trunk leans FWD/BWD, hip abduction with blue band VC to keep chest lifted       Lumbar Exercises: Supine   Single Leg Bridge  -- 2x10 figure 4's each sode    Straight Leg Raise  10 reps With TA contractions - VC for form to keep pelvis stable.       Lumbar Exercises: Sidelying   Clam  Both 2x10 reverse and regular 3# wt, VC for form.       Lumbar Exercises: Prone   Other Prone Lumbar Exercises  pelvic press, then 5 reps SLR, progressed HEP , 10 reps bent knee hip extension and 5 reps upper body portion of pelvic press series reviewed, feedback provided  Modalities   Modalities  Moist Heat      Moist Heat Therapy   Number Minutes Moist Heat  10 Minutes    Moist Heat Location  Lumbar Spine thoracic               PT Short Term Goals - 10/24/17 1017      PT SHORT TERM GOAL #2   Title  ability to demonstrate correct posture in sitting and standing instead of being flexed     Baseline  after therapy she is able to stand taller    Time  4    Period  Weeks    Status  On-going      PT SHORT TERM GOAL #3   Title  ability to contract the lower abdominal muscles so she will be able to contract the pelvic floor muscles in a circular fashion    Time  4    Period  Weeks    Status  On-going      PT SHORT TERM GOAL #4   Title  improved left hip ER to >/= 60 degrees passively    Time  4    Period  Weeks    Status  On-going        PT Long Term Goals - 10/31/17 1049      PT LONG TERM GOAL #1   Title  I with advanced HEP for core stability (11/02/17)     Status  On-going      PT LONG TERM GOAL #2   Title  report =/> 75% reduction of Lt LE symptoms with walking ( 11/02/17)     Status  On-going 45-50% this week      PT LONG TERM GOAL  #3   Title  improve FOTO =/< 29% limited ( 11/02/17)     Status  On-going      PT LONG TERM GOAL #4   Title  maintain level pelvic, not Lt ilium rotation =/> 1 wk (11/02/17)     Status  Achieved      PT LONG TERM GOAL #5   Title  demo bilat hip strength =/> 5-/5 to allow her to start a walking program (11/02/17)     Status  Partially Met            Plan - 10/31/17 1040    Clinical Impression Statement  Jamie Miller continues to make slow progress towards her back/hip goals.  She is very weak in her core and fatigues quickly.  Pain is decreasing overall.  She is concerned about finances and requests to reduce frequency to everyother week for two more visits.  She requires frequent verbal cues for form and reinforcement for desired exercise.    Rehab Potential  Excellent    PT Frequency  Biweekly decrease frequency due to patient request finances    PT Duration  8 weeks    PT Treatment/Interventions  Biofeedback;Cryotherapy;Electrical Stimulation;Moist Heat;Ultrasound;Therapeutic activities;Therapeutic exercise;Patient/family education;Neuromuscular re-education;Manual techniques;Scar mobilization;Passive range of motion;Dry needling    PT Next Visit Plan  soft tissue work ot legs and pelvic floor; check pelvic alignment; work on lower abdominal strength; sry needling, FOTO as relates to low back pain     Consulted and Agree with Plan of Care  Patient       Patient will benefit from skilled therapeutic intervention in order to improve the following deficits and impairments:  Pain, Increased fascial restricitons, Decreased coordination, Decreased mobility, Increased muscle spasms, Decreased scar mobility, Decreased endurance, Decreased range  of motion, Decreased strength, Decreased activity tolerance  Visit Diagnosis: Other muscle spasm  Muscle weakness (generalized)  Abnormal posture  Chronic left-sided low back pain with left-sided sciatica     Problem List Patient Active Problem List    Diagnosis Date Noted  . Spondylosis of lumbosacral region without myelopathy or radiculopathy 09/07/2017  . Generalized abdominal pain 10/11/2016  . Dyspareunia in female 08/25/2015  . Irregular menses 04/16/2013  . Vitamin D deficiency 04/16/2013  . H/O: hypothyroidism 11/14/2012    Jamie Miller PT  10/31/2017, 11:01 AM  [facility] [address] [place], [place], [address] Phone: [phone]   Fax:  [phone]  Name: MAYBREE GROETSCH MRN: [id number] Date of Birth: [birth year]

## 2017-11-01 DIAGNOSIS — R350 Frequency of micturition: Secondary | ICD-10-CM | POA: Diagnosis not present

## 2017-11-02 ENCOUNTER — Ambulatory Visit: Payer: 59 | Attending: Family Medicine | Admitting: Physical Therapy

## 2017-11-02 ENCOUNTER — Encounter: Payer: Self-pay | Admitting: Physical Therapy

## 2017-11-02 DIAGNOSIS — M6281 Muscle weakness (generalized): Secondary | ICD-10-CM | POA: Diagnosis not present

## 2017-11-02 DIAGNOSIS — R252 Cramp and spasm: Secondary | ICD-10-CM | POA: Diagnosis present

## 2017-11-02 DIAGNOSIS — R293 Abnormal posture: Secondary | ICD-10-CM

## 2017-11-02 DIAGNOSIS — M62838 Other muscle spasm: Secondary | ICD-10-CM | POA: Diagnosis present

## 2017-11-02 NOTE — Therapy (Signed)
[facility] [address], [place], [place], [address] Phone: [phone]   Fax:  [phone]  Physical Therapy Treatment  Patient Details  Name: Jamie Miller MRN: [id number] Date of Birth: 1971-08-04 Referring Provider: Dr. Howard Pouch   Encounter Date: 11/02/2017  PT End of Session - 11/02/17 1147    Visit Number  10    Number of Visits  12    Date for PT Re-Evaluation  11/22/17    Authorization Type  UHC    PT Start Time  1100 pelvis    PT Stop Time  1140    PT Time Calculation (min)  40 min    Activity Tolerance  Patient tolerated treatment well;No increased pain    Behavior During Therapy  WFL for tasks assessed/performed       Past Medical History:  Diagnosis Date  . Allergy   . Anxiety   . Asthma   . Chicken pox   . OAB (overactive bladder)   . Thyroid disease    was on low dose synthroid at one time, but removed and levels have been "normal" since and followed thorugh GYN  . Vitamin D deficiency     Past Surgical History:  Procedure Laterality Date  . APPENDECTOMY  1990  . BREAST BIOPSY  2000   biopsy  . CERVICAL CERCLAGE  2009  . HYMENECTOMY  2005  . WISDOM TOOTH EXTRACTION      There were no vitals filed for this visit.  Subjective Assessment - 11/02/17 1104    Subjective  I saw the urologist who gave me a clean bill of health and gave me a scipt for bladder spasms. I was able to have a vaginal exam with 2/10 pain. I had leakage when I bent down one time whick is good.     Pertinent History  PT for urinary incontinence 2009.      Diagnostic tests  x-rays of spine degenerative changes    Patient Stated Goals  not worry about urinary leakage, decreased pain with intercourse    Currently in Pain?  Yes    Pain Location  Abdomen    Pain Orientation  Lower    Pain Descriptors / Indicators  Discomfort    Pain Type  Chronic pain    Pain Onset  More than a month ago    Pain Frequency  Constant     Aggravating Factors   intercourse    Pain Relieving Factors  heat    Multiple Pain Sites  No                      [facility] Adult PT Treatment/Exercise - 11/02/17 0001      Manual Therapy   Manual Therapy  Myofascial release    Myofascial Release  release to the lower abdomen going through all the planes of fascia  while instructing patient with body scanning to release tension             PT Education - 11/02/17 1146    Education provided  Yes    Education Details  body scanning    Person(s) Educated  Patient    Methods  Explanation;Verbal cues;Demonstration    Comprehension  Verbalized understanding;Returned demonstration       PT Short Term Goals - 11/02/17 1152      PT SHORT TERM GOAL #2   Title  ability to demonstrate correct posture in sitting and standing instead of being  flexed     Time  4    Period  Weeks    Status  Achieved      PT SHORT TERM GOAL #3   Title  ability to contract the lower abdominal muscles so she will be able to contract the pelvic floor muscles in a circular fashion    Time  4    Period  Weeks    Status  On-going      PT SHORT TERM GOAL #4   Title  improved left hip ER to >/= 60 degrees passively    Time  4    Period  Weeks    Status  On-going        PT Long Term Goals - 10/31/17 1049      PT LONG TERM GOAL #1   Title  I with advanced HEP for core stability (11/02/17)     Status  On-going      PT LONG TERM GOAL #2   Title  report =/> 75% reduction of Lt LE symptoms with walking ( 11/02/17)     Status  On-going 45-50% this week      PT LONG TERM GOAL #3   Title  improve FOTO =/< 29% limited ( 11/02/17)     Status  On-going      PT LONG TERM GOAL #4   Title  maintain level pelvic, not Lt ilium rotation =/> 1 wk (11/02/17)     Status  Achieved      PT LONG TERM GOAL #5   Title  demo bilat hip strength =/> 5-/5 to allow her to start a walking program (11/02/17)     Status  Partially Met            Plan -  11/02/17 1147    Clinical Impression Statement  Patient is able to have her husband touch her stomach due to her accepting this area. Patient only leaked one time since last treatment when she bent over. Patient was able to have a vaginal exam with adult size speculum  and pain level 2/10.  Patient was able to have the therapist touch her vaginal area outside her pants with out discomfort for first time.  Patient was able to stand taller after therapy.  Patient will benefit from skilled therapy to improve posture, reduce pain and restore function.     Rehab Potential  Excellent    Clinical Impairments Affecting Rehab Potential  None    PT Frequency  Biweekly    PT Duration  8 weeks    PT Treatment/Interventions  Biofeedback;Cryotherapy;Electrical Stimulation;Moist Heat;Ultrasound;Therapeutic activities;Therapeutic exercise;Patient/family education;Neuromuscular re-education;Manual techniques;Scar mobilization;Passive range of motion;Dry needling    PT Next Visit Plan  soft tissue work ot legs and pelvic floor; check pelvic alignment; work on lower abdominal strength; sry needling, FOTO as relates to low back pain     PT Home Exercise Plan  Progress as needed    Consulted and Agree with Plan of Care  Patient       Patient will benefit from skilled therapeutic intervention in order to improve the following deficits and impairments:  Pain, Increased fascial restricitons, Decreased coordination, Decreased mobility, Increased muscle spasms, Decreased scar mobility, Decreased endurance, Decreased range of motion, Decreased strength, Decreased activity tolerance  Visit Diagnosis: Other muscle spasm  Muscle weakness (generalized)  Abnormal posture     Problem List Patient Active Problem List   Diagnosis Date Noted  . Spondylosis of lumbosacral region without myelopathy or radiculopathy 09/07/2017  .  Generalized abdominal pain 10/11/2016  . Dyspareunia in female 08/25/2015  . Irregular menses  04/16/2013  . Vitamin D deficiency 04/16/2013  . H/O: hypothyroidism 11/14/2012    Earlie Counts, PT 11/02/17 11:53 AM   [facility] [address] [place], [place], [address] Phone: [phone]   Fax:  [phone]  Name: Jamie Miller MRN: [id number] Date of Birth: 12-16-1970

## 2017-11-13 ENCOUNTER — Encounter: Payer: Self-pay | Admitting: Physical Therapy

## 2017-11-13 ENCOUNTER — Ambulatory Visit: Payer: 59 | Admitting: Physical Therapy

## 2017-11-13 DIAGNOSIS — M62838 Other muscle spasm: Secondary | ICD-10-CM | POA: Diagnosis not present

## 2017-11-13 DIAGNOSIS — M6281 Muscle weakness (generalized): Secondary | ICD-10-CM

## 2017-11-13 DIAGNOSIS — R293 Abnormal posture: Secondary | ICD-10-CM

## 2017-11-13 NOTE — Therapy (Signed)
[facility] [address] [place], [place], [address] Phone: [phone]   Fax:  [phone]  Physical Therapy Treatment  Patient Details  Name: Jamie Miller MRN: [id number] Date of Birth: 23-Aug-1971 Referring Provider: Dr. Howard Pouch   Encounter Date: 11/13/2017  PT End of Session - 11/13/17 1154    Visit Number  11    Date for PT Re-Evaluation  02/05/18    Authorization Type  UHC    PT Start Time  1700    PT Stop Time  1230    PT Time Calculation (min)  45 min    Activity Tolerance  Patient tolerated treatment well;No increased pain    Behavior During Therapy  WFL for tasks assessed/performed       Past Medical History:  Diagnosis Date  . Allergy   . Anxiety   . Asthma   . Chicken pox   . OAB (overactive bladder)   . Thyroid disease    was on low dose synthroid at one time, but removed and levels have been "normal" since and followed thorugh GYN  . Vitamin D deficiency     Past Surgical History:  Procedure Laterality Date  . APPENDECTOMY  1990  . BREAST BIOPSY  2000   biopsy  . CERVICAL CERCLAGE  2009  . HYMENECTOMY  2005  . WISDOM TOOTH EXTRACTION      There were no vitals filed for this visit.  Subjective Assessment - 11/13/17 1152    Subjective  I felt good after therapy for 1 day.  The next day I felt the constant pain.  When I stop caffiene it helped my bladder.     Pertinent History  PT for urinary incontinence 2009.      Diagnostic tests  x-rays of spine degenerative changes    Patient Stated Goals  not worry about urinary leakage, decreased pain with intercourse    Currently in Pain?  Yes    Pain Score  4     Pain Location  Abdomen bladder    Pain Orientation  Lower    Pain Descriptors / Indicators  Discomfort    Pain Type  Chronic pain    Pain Onset  More than a month ago    Pain Frequency  Constant    Aggravating Factors   intercourse, caffiene,     Pain Relieving Factors  heat     Multiple Pain Sites  No         [facility] PT Assessment - 11/13/17 0001      Assessment   Medical Diagnosis  N393.3 Stress incontinence of urine/lumbar radiculopathy    Referring Provider  Dr. Howard Pouch    Onset Date/Surgical Date  08/28/17    Hand Dominance  Left    Next MD Visit  PRN    Prior Therapy  for pelvic pain      Precautions   Precautions  None      Restrictions   Weight Bearing Restrictions  No      Home Environment   Living Environment  Private residence      Prior Function   Level of Independence  Independent      Cognition   Overall Cognitive Status  Within Functional Limits for tasks assessed      Posture/Postural Control   Posture/Postural Control  Postural limitations    Postural Limitations  Decreased lumbar lordosis;Forward head;Rounded Shoulders;Increased thoracic kyphosis    Posture Comments  Lt ASIS higher  in supine, medial maleoli      AROM   Lumbar Flexion  to floor    Lumbar Extension  50% limitation with no pain    Lumbar - Right Side Bend  WNL    Lumbar - Left Side Bend  WNL    Lumbar - Right Rotation  WNL    Lumbar - Left Rotation  WNL      PROM   Right Hip External Rotation   70    Left Hip External Rotation   60      Strength   Right Hip Extension  4-/5    Right Hip ABduction  4+/5    Left Hip Extension  4/5    Left Hip ABduction  4+/5      Palpation   SI assessment   pelvis is in correct alignment               Pelvic Floor Special Questions - 11/13/17 0001    Diastasis Recti  none    Urinary Leakage  Yes    How often  25% better with urinary leakage    Activities that cause leaking  Bending;Lifting    Exam Type  Deferred due to pain and comfort        [facility] Adult PT Treatment/Exercise - 11/13/17 0001      Therapeutic Activites    Therapeutic Activities  Lifting    Lifting  breathing with lifting and bending to reduce abdominal pressure and reduce leakage.       Manual Therapy   Manual Therapy  Soft  tissue mobilization;Myofascial release    Soft tissue mobilization  bilateral sides of the ischiocavernosus and bulbocavernsous, along the pubic rim, lower abdomen with left tighter than right through her pants    Myofascial Release  release to the lower abdomen going through all the planes of fascia  while instructing patient with body scanning to release tension               PT Short Term Goals - 11/13/17 1430      PT SHORT TERM GOAL #4   Title  improved left hip ER to >/= 60 degrees passively    Time  4    Period  Weeks    Status  Achieved        PT Long Term Goals - 11/13/17 1431      PT LONG TERM GOAL #1   Title  I with advanced HEP for core stability (11/02/17)     Baseline  still learning    Time  6    Period  Weeks    Status  On-going      PT LONG TERM GOAL #2   Title  report =/> 75% reduction of Lt LE symptoms with walking ( 11/02/17)     Baseline  50-60%    Time  6    Period  Weeks    Status  On-going      PT LONG TERM GOAL #3   Title  improve FOTO =/< 29% limited ( 11/02/17)     Time  6    Period  Weeks    Status  On-going      PT LONG TERM GOAL #4   Title  maintain level pelvic, not Lt ilium rotation =/> 1 wk (11/02/17)     Time  6    Period  Weeks    Status  Achieved      PT LONG TERM GOAL #5  Title  demo bilat hip strength =/> 5-/5 to allow her to start a walking program (11/02/17)     Time  6    Period  Weeks    Status  Partially Met      PT LONG TERM GOAL #6   Title  pain with intercourse decreased >/= 50% due to ability to relax the pelvic floor and understand vaginal health    Baseline  not able to do at this time    Time  8    Period  Weeks    Status  On-going      PT LONG TERM GOAL #7   Title  urinary leakage decreased >/= 75% due to improve circular contraction of pelvic floor and increased strength    Baseline  25% better    Time  8    Period  Weeks    Status  On-going      PT LONG TERM GOAL #8   Title  understand how to use  moisturizers and lubricants for the vaginal area for good vaginal health    Time  8    Period  Weeks    Status  On-going            Plan - 11/13/17 1432    Clinical Impression Statement  Patient reports her urinary leakage is 25% better and happens mostly with bending and lifting.  Left hip external rotation is 60 degrees and right is 65 degrees.  Patient continues to have weakness in hip abductors and extension.  Abdominal strength has increased to 3/5 and no palpable diastasis recti.  Pelvic strength is 1/5.  Palpable tenderness located on the pelvic floor muscles externally left worse than the right.  Left lower extremity symptoms with walking decreased by 60%.  Patient will benefit from skilled therapy to improve core strength and reduce pelvic pain so she is able to return to prior activities, intimate relationship with her husband and internal exams.     Rehab Potential  Excellent    Clinical Impairments Affecting Rehab Potential  None    PT Frequency  1x / week    PT Duration  12 weeks    PT Treatment/Interventions  Biofeedback;Cryotherapy;Electrical Stimulation;Moist Heat;Ultrasound;Therapeutic activities;Therapeutic exercise;Patient/family education;Neuromuscular re-education;Manual techniques;Scar mobilization;Passive range of motion;Dry needling    PT Next Visit Plan  soft tissue work externally and possible internally, core strength, abdominal soft tissue work    PT Home Exercise Plan  Progress as needed    Recommended Other Services  MD renewal sent 11/13/2017    Consulted and Agree with Plan of Care  Patient       Patient will benefit from skilled therapeutic intervention in order to improve the following deficits and impairments:  Pain, Increased fascial restricitons, Decreased coordination, Decreased mobility, Increased muscle spasms, Decreased scar mobility, Decreased endurance, Decreased range of motion, Decreased strength, Decreased activity tolerance  Visit  Diagnosis: Other muscle spasm - Plan: PT plan of care cert/re-cert  Muscle weakness (generalized) - Plan: PT plan of care cert/re-cert  Abnormal posture - Plan: PT plan of care cert/re-cert     Problem List Patient Active Problem List   Diagnosis Date Noted  . Spondylosis of lumbosacral region without myelopathy or radiculopathy 09/07/2017  . Generalized abdominal pain 10/11/2016  . Dyspareunia in female 08/25/2015  . Irregular menses 04/16/2013  . Vitamin D deficiency 04/16/2013  . H/O: hypothyroidism 11/14/2012    Earlie Counts, PT 11/13/17 2:40 PM   [facility] [address], [place], [place], [address] Phone: [phone]   Fax:  [phone]  Name: ARIANNI GALLEGO MRN: [id number] Date of Birth: [birth year]

## 2017-11-14 ENCOUNTER — Ambulatory Visit (INDEPENDENT_AMBULATORY_CARE_PROVIDER_SITE_OTHER): Payer: 59 | Admitting: Physical Therapy

## 2017-11-14 DIAGNOSIS — M62838 Other muscle spasm: Secondary | ICD-10-CM | POA: Diagnosis not present

## 2017-11-14 DIAGNOSIS — M6281 Muscle weakness (generalized): Secondary | ICD-10-CM

## 2017-11-14 DIAGNOSIS — R293 Abnormal posture: Secondary | ICD-10-CM | POA: Diagnosis not present

## 2017-11-14 DIAGNOSIS — G8929 Other chronic pain: Secondary | ICD-10-CM

## 2017-11-14 DIAGNOSIS — M5442 Lumbago with sciatica, left side: Secondary | ICD-10-CM | POA: Diagnosis not present

## 2017-11-14 NOTE — Patient Instructions (Signed)
Practice hip hinge - using dowel or broom handle.  Hold it in the fold at the hips  Transfer sit to/from stand And practice bending forward  Keeping dowel in fold of hips without needing to hold with hands

## 2017-11-14 NOTE — Therapy (Signed)
[facility] [place], [place], [address] Phone: [phone]   Fax:  [phone]  Physical Therapy Treatment  Patient Details  Name: Jamie Miller MRN: [id number] Date of Birth: 1970/09/06 Referring Provider: Dr. Howard Pouch   Encounter Date: 11/14/2017  PT End of Session - 11/14/17 0900    Visit Number  12    Number of Visits  24    Date for PT Re-Evaluation  02/05/18    Authorization Type  UHC    PT Start Time  0851    PT Stop Time  0933    PT Time Calculation (min)  42 min    Activity Tolerance  Other (comment) limited due to some fear of activities       Past Medical History:  Diagnosis Date  . Allergy   . Anxiety   . Asthma   . Chicken pox   . OAB (overactive bladder)   . Thyroid disease    was on low dose synthroid at one time, but removed and levels have been "normal" since and followed thorugh GYN  . Vitamin D deficiency     Past Surgical History:  Procedure Laterality Date  . APPENDECTOMY  1990  . BREAST BIOPSY  2000   biopsy  . CERVICAL CERCLAGE  2009  . HYMENECTOMY  2005  . WISDOM TOOTH EXTRACTION      There were no vitals filed for this visit.  Subjective Assessment - 11/14/17 0853    Subjective  Shannia reports she found a knot in Rt quad and not sure what it is, tender to palpation, wondering about the numbness in the lateral Lt LE. Still having trouble with multi-tasking in exercise.     Diagnostic tests  x-rays of spine degenerative changes    Patient Stated Goals  not worry about urinary leakage, decreased pain with intercourse         [facility] PT Assessment - 11/14/17 0001      Observation/Other Assessments   Focus on Therapeutic Outcomes (FOTO)   33% limited                  [facility] Adult PT Treatment/Exercise - 11/14/17 0001      Therapeutic Activites    Therapeutic Activities  ADL's    ADL's  hip hinging for FWD bend and with sit to stand tranfers - used  wooden dowel for alignment feedback. multiple reps and cues.       Lumbar Exercises: Aerobic   Nustep  L4x5'      Lumbar Exercises: Standing   Wall Slides  10 reps with ball bewhind back, VC for form/alignment      Lumbar Exercises: Seated   Hip Flexion on Ball  Strengthening;Both;10 reps    Other Seated Lumbar Exercises  opposite arm/leg lifts sitting on ball.       Lumbar Exercises: Prone   Opposite Arm/Leg Raise  Left arm/Right leg;Right arm/Left leg;10 reps      Manual Therapy   Manual Therapy  Joint mobilization    Joint Mobilization  grade II mobs sacrum to mid thoracic CPA mos most tender at L4-5 , unable to tolerate more than grade II     Soft tissue mobilization  assesed small nodule in Rt  medial quad - feels like small fatty deposit.                PT Short Term Goals - 11/13/17 1430      PT  SHORT TERM GOAL #4   Title  improved left hip ER to >/= 60 degrees passively    Time  4    Period  Weeks    Status  Achieved        PT Long Term Goals - 11/14/17 0950      PT LONG TERM GOAL #1   Title  I with advanced HEP for core stability (02/05/18)     Time  12    Period  Weeks    Status  On-going      PT LONG TERM GOAL #2   Title  report =/> 75% reduction of Lt LE symptoms with walking ( 02/05/18)     Time  12    Period  Weeks    Status  On-going      PT LONG TERM GOAL #3   Title  improve FOTO =/< 29% limited ( 02/05/18)     Time  12    Period  Weeks    Status  On-going scored 33% limited - same as initial eval      PT LONG TERM GOAL #4   Title  maintain level pelvic, not Lt ilium rotation =/> 1 wk (11/02/17)     Status  Achieved      PT LONG TERM GOAL #5   Title  demo bilat hip strength =/> 5-/5 to allow her to start a walking program (02/05/18)     Time  12    Period  Weeks    Status  Partially Met            Plan - 11/14/17 0954    Clinical Impression Statement  Jamie Miller is making slow progress towards her goals. Renewal was written yesterday.  Pt  requires a lot of verbal cues for form. She has a lot of difficulty with multi-tasking of exercise and requires reinforment both verbal and physical. Making slow progress to goals.     Rehab Potential  Excellent    Clinical Impairments Affecting Rehab Potential  None    PT Frequency  1x / week    PT Duration  12 weeks    PT Treatment/Interventions  Biofeedback;Cryotherapy;Electrical Stimulation;Moist Heat;Ultrasound;Therapeutic activities;Therapeutic exercise;Patient/family education;Neuromuscular re-education;Manual techniques;Scar mobilization;Passive range of motion;Dry needling    PT Next Visit Plan  soft tissue work externally and possible internally, core strength, abdominal soft tissue work    PT Home Exercise Plan  Progress as needed    Consulted and Agree with Plan of Care  Patient       Patient will benefit from skilled therapeutic intervention in order to improve the following deficits and impairments:  Pain, Increased fascial restricitons, Decreased coordination, Decreased mobility, Increased muscle spasms, Decreased scar mobility, Decreased endurance, Decreased range of motion, Decreased strength, Decreased activity tolerance  Visit Diagnosis: Other muscle spasm  Muscle weakness (generalized)  Abnormal posture  Chronic left-sided low back pain with left-sided sciatica     Problem List Patient Active Problem List   Diagnosis Date Noted  . Spondylosis of lumbosacral region without myelopathy or radiculopathy 09/07/2017  . Generalized abdominal pain 10/11/2016  . Dyspareunia in female 08/25/2015  . Irregular menses 04/16/2013  . Vitamin D deficiency 04/16/2013  . H/O: hypothyroidism 11/14/2012    Jeral Pinch PT  11/14/2017, 10:51 AM  [facility] [place], [place], [address] Phone: [phone]   Fax:  [phone]  Name: Jamie Miller MRN: [id number] Date of Birth: [birth year]

## 2017-11-16 ENCOUNTER — Encounter: Payer: Self-pay | Admitting: Obstetrics & Gynecology

## 2017-11-16 ENCOUNTER — Telehealth: Payer: Self-pay | Admitting: Obstetrics & Gynecology

## 2017-11-16 NOTE — Telephone Encounter (Signed)
Spoke with patient. Reports intermittent, clear vaginal d/c when wiping, has been ongoing. Patient states she was seen by urology and was recommended to f/u.   Denies pain, bleeding, color or odorous d/c.   Multiple appointments offered, patient declined. Patient states she will have to look closer at her schedule and return call to schedule OV.   Routing to provider for final review. Patient is agreeable to disposition. Will close encounter.

## 2017-11-16 NOTE — Telephone Encounter (Signed)
-----   Message from Girard, Generic sent at 11/16/2017 11:22 AM EDT -----    Dear Dr. Sabra Heck,    I've been seeing occasional discharge when I use the restroom and wanted to make sure that was 'normal'. I recently went to a urologist for bladder spasms and he did a culture and came back no yeast or UTI. He did see the discharge during the exam and suggested I contact you to see as he put it the discharge was my normal.  Thank you very much  Christella Noa

## 2017-11-20 ENCOUNTER — Encounter: Payer: Self-pay | Admitting: Physical Therapy

## 2017-11-20 ENCOUNTER — Ambulatory Visit: Payer: 59 | Admitting: Physical Therapy

## 2017-11-20 DIAGNOSIS — R293 Abnormal posture: Secondary | ICD-10-CM

## 2017-11-20 DIAGNOSIS — M62838 Other muscle spasm: Secondary | ICD-10-CM | POA: Diagnosis not present

## 2017-11-20 DIAGNOSIS — M6281 Muscle weakness (generalized): Secondary | ICD-10-CM

## 2017-11-20 NOTE — Therapy (Signed)
[facility] [address] [place], [place], [address] Phone: [phone]   Fax:  [phone]  Physical Therapy Treatment  Patient Details  Name: Jamie Miller MRN: [id number] Date of Birth: 1970/10/17 Referring Provider: Dr. Howard Pouch   Encounter Date: 11/20/2017  PT End of Session - 11/20/17 1016    Visit Number  13    Number of Visits  24    Date for PT Re-Evaluation  02/05/18    Authorization Type  UHC    PT Start Time  0930    PT Stop Time  1010    PT Time Calculation (min)  40 min    Activity Tolerance  Patient tolerated treatment well    Behavior During Therapy  [facility] for tasks assessed/performed       Past Medical History:  Diagnosis Date  . Allergy   . Anxiety   . Asthma   . Chicken pox   . OAB (overactive bladder)   . Thyroid disease    was on low dose synthroid at one time, but removed and levels have been "normal" since and followed thorugh GYN  . Vitamin D deficiency     Past Surgical History:  Procedure Laterality Date  . APPENDECTOMY  1990  . BREAST BIOPSY  2000   biopsy  . CERVICAL CERCLAGE  2009  . HYMENECTOMY  2005  . WISDOM TOOTH EXTRACTION      There were no vitals filed for this visit.  Subjective Assessment - 11/20/17 0931    Subjective  Nothing has changed since last week. No change in urinary leakage but not worse.     Pertinent History  PT for urinary incontinence 2009.      Diagnostic tests  x-rays of spine degenerative changes    Patient Stated Goals  not worry about urinary leakage, decreased pain with intercourse    Currently in Pain?  Yes    Pain Score  2     Pain Location  Abdomen    Pain Orientation  Lower    Pain Descriptors / Indicators  Discomfort    Pain Type  Chronic pain    Pain Onset  More than a month ago    Pain Frequency  Constant    Aggravating Factors   intercourse, caffiene    Pain Relieving Factors  heat    Multiple Pain Sites  No                       [facility] Adult PT Treatment/Exercise - 11/20/17 0001      Neuro Re-ed    Neuro Re-ed Details   diaphragmatic breathing opening the lower rib cage and abdominals wiht tactile cues      Manual Therapy   Manual Therapy  Soft tissue mobilization;Myofascial release    Soft tissue mobilization  soft tissue work to the scar midline of abdomen, along the rectus abdominus, and obliques    Myofascial Release  along the midline and lower abdominals, pulling up from the back to the abdominal midline to release the fascia             PT Education - 11/20/17 1013    Education provided  Yes    Education Details  diaphgramatic breathing, massage the vulva area    Person(s) Educated  Patient    Methods  Explanation;Demonstration;Verbal cues;Handout    Comprehension  Returned demonstration;Verbalized understanding       PT Short Term Goals -  11/13/17 1430      PT SHORT TERM GOAL #4   Title  improved left hip ER to >/= 60 degrees passively    Time  4    Period  Weeks    Status  Achieved        PT Long Term Goals - 11/14/17 0950      PT LONG TERM GOAL #1   Title  I with advanced HEP for core stability (02/05/18)     Time  12    Period  Weeks    Status  On-going      PT LONG TERM GOAL #2   Title  report =/> 75% reduction of Lt LE symptoms with walking ( 02/05/18)     Time  12    Period  Weeks    Status  On-going      PT LONG TERM GOAL #3   Title  improve FOTO =/< 29% limited ( 02/05/18)     Time  12    Period  Weeks    Status  On-going scored 33% limited - same as initial eval      PT LONG TERM GOAL #4   Title  maintain level pelvic, not Lt ilium rotation =/> 1 wk (11/02/17)     Status  Achieved      PT LONG TERM GOAL #5   Title  demo bilat hip strength =/> 5-/5 to allow her to start a walking program (02/05/18)     Time  12    Period  Weeks    Status  Partially Met            Plan - 11/20/17 1016    Clinical Impression Statement  Patient  has increased tightness in the abdominals and diaphgram.  Patient has difficulty with filling her lower rib cage and abdominals with air due to tissue restrictions.  Patient will get back pain during abdominal release.  Patient will benefit from skilled therapy to release tissue and improve pain.     Rehab Potential  Excellent    Clinical Impairments Affecting Rehab Potential  None    PT Frequency  1x / week    PT Duration  12 weeks    PT Treatment/Interventions  Biofeedback;Cryotherapy;Electrical Stimulation;Moist Heat;Ultrasound;Therapeutic activities;Therapeutic exercise;Patient/family education;Neuromuscular re-education;Manual techniques;Scar mobilization;Passive range of motion;Dry needling    PT Next Visit Plan  soft tissue work externally and possible internally, core strength, abdominal soft tissue work    PT Home Exercise Plan  Progress as needed    Recommended Other Services  MD signe renewal note    Consulted and Agree with Plan of Care  Patient       Patient will benefit from skilled therapeutic intervention in order to improve the following deficits and impairments:  Pain, Increased fascial restricitons, Decreased coordination, Decreased mobility, Increased muscle spasms, Decreased scar mobility, Decreased endurance, Decreased range of motion, Decreased strength, Decreased activity tolerance  Visit Diagnosis: Other muscle spasm  Abnormal posture  Muscle weakness (generalized)     Problem List Patient Active Problem List   Diagnosis Date Noted  . Spondylosis of lumbosacral region without myelopathy or radiculopathy 09/07/2017  . Generalized abdominal pain 10/11/2016  . Dyspareunia in female 08/25/2015  . Irregular menses 04/16/2013  . Vitamin D deficiency 04/16/2013  . H/O: hypothyroidism 11/14/2012    Earlie Counts, PT 11/20/17 10:19 AM    [facility] [address] [place], [place], [address] Phone:  [phone]   Fax:  [phone]  Name: Jamie Miller MRN: [id number] Date of Birth: August 16, 1971

## 2017-11-20 NOTE — Patient Instructions (Signed)
Diaphragmatic Breathing - Supine    Hands on top of navel, lips closed, breathe in through nose filling stomach with air, navel moves out toward hands. Exhale through puckered lips, hands follow exhalation in. Repeat _10__ times. Rest __1_ seconds between repeats. Do _1__ times per day.  Copyright  VHI. All rights reserved.  Massage around the vulva with small light circular movements for 2 minutes daily.  [facility] [address] [place], [place] [address] Phone # [phone] Fax [phone]

## 2017-11-27 ENCOUNTER — Encounter: Payer: Self-pay | Admitting: Physical Therapy

## 2017-11-27 ENCOUNTER — Ambulatory Visit: Payer: 59 | Admitting: Physical Therapy

## 2017-11-27 DIAGNOSIS — R293 Abnormal posture: Secondary | ICD-10-CM

## 2017-11-27 DIAGNOSIS — M62838 Other muscle spasm: Secondary | ICD-10-CM | POA: Diagnosis not present

## 2017-11-27 DIAGNOSIS — M6281 Muscle weakness (generalized): Secondary | ICD-10-CM

## 2017-11-27 DIAGNOSIS — R252 Cramp and spasm: Secondary | ICD-10-CM

## 2017-11-27 NOTE — Patient Instructions (Addendum)
On Elbows (Prone)   Prop pillow on top of pillow Rise up on elbows as high as possible, keeping hips on floor. Hold __30__ seconds. Repeat __3__ times per set. Do ___1_ sets per session. Do __2__ sessions per day.  http://orth.exer.us/93   Copyright  VHI. All rights reserved.  Anterior Pelvic Tilt (Supine)    Lie on back with knees bent. Arch back and Hold __1__ seconds. Flatten back to relax. Repeat _10___ times. Do ___2_ sessions per day.  http://gt2.exer.us/684   Copyright  VHI. All rights reserved.   Lay on foam along the spine to stretch the chest, hold 1 min 2 times per day to increase upright posture.   CHEST: Doorway, Bilateral - Standing    Standing in doorway, place hands on wall with elbows bent at shoulder height. Lean forward. Hold _30__ seconds. Place one foot ahead.  __2_ reps  2 times per day Copyright  VHI. All rights reserved.   Do the WII yoga  3 times per week.   [facility] [address] [place], [place] [address] Phone # [phone] Fax [phone]

## 2017-11-27 NOTE — Therapy (Signed)
[facility] [address] [place], [place], [address] Phone: [phone]   Fax:  [phone]  Physical Therapy Treatment  Patient Details  Name: Jamie Miller MRN: [id number] Date of Birth: 05-05-1971 Referring Provider: Dr. Howard Pouch   Encounter Date: 11/27/2017  PT End of Session - 11/27/17 1155    Visit Number  14    Number of Visits  24    Date for PT Re-Evaluation  02/05/18    Authorization Type  UHC    PT Start Time  2035    PT Stop Time  1230    PT Time Calculation (min)  45 min    Activity Tolerance  Patient tolerated treatment well    Behavior During Therapy  [facility] for tasks assessed/performed       Past Medical History:  Diagnosis Date  . Allergy   . Anxiety   . Asthma   . Chicken pox   . OAB (overactive bladder)   . Thyroid disease    was on low dose synthroid at one time, but removed and levels have been "normal" since and followed thorugh GYN  . Vitamin D deficiency     Past Surgical History:  Procedure Laterality Date  . APPENDECTOMY  1990  . BREAST BIOPSY  2000   biopsy  . CERVICAL CERCLAGE  2009  . HYMENECTOMY  2005  . WISDOM TOOTH EXTRACTION      There were no vitals filed for this visit.  Subjective Assessment - 11/27/17 1151    Subjective  anal penetration was 20% decreased in pain. Diaphragmatic breathing is easier to do and relaxes me.     Pertinent History  PT for urinary incontinence 2009.      Diagnostic tests  x-rays of spine degenerative changes    Patient Stated Goals  not worry about urinary leakage, decreased pain with intercourse    Currently in Pain?  Yes    Pain Score  4     Pain Location  Abdomen    Pain Orientation  Right;Lower    Pain Descriptors / Indicators  Discomfort    Pain Onset  More than a month ago    Pain Frequency  Constant    Aggravating Factors   intercourse, caffiene    Pain Relieving Factors  heat                No data  recorded    Pelvic Floor Special Questions - 11/27/17 0001    Pelvic Floor Internal Exam  Patient cofirms identification and approves PT to assess muslce integrity and treatment    Exam Type  Vaginal    Palpation  only went to the introitus        [facility] Adult PT Treatment/Exercise - 11/27/17 0001      Lumbar Exercises: Supine   Other Supine Lumbar Exercises  lay with hips on the sit fit and work on pelvic rock with tactile and verbal cues to get minimal motion      Lumbar Exercises: Prone   Other Prone Lumbar Exercises  prone on elbows increased after soft tissue work and easier to do      Manual Therapy   Manual Therapy  Joint mobilization;Soft tissue mobilization;Internal Pelvic Floor    Joint Mobilization  grade 3 mobilization of T4-L5 to increase thoracic extension and lumbar lordosis    Internal Pelvic Floor  soft tissue work to the bil. ischiocavernosus externally, myofascial release to the intoritus monitoring for  no pain above 3/10 and patient discomfort level,              PT Education - 11/27/17 1242    Education provided  Yes    Education Details  pelvic tilt, lay on roll to expand the spine, doorway stretch    Person(s) Educated  Patient    Methods  Explanation;Demonstration;Tactile cues;Verbal cues;Handout    Comprehension  Returned demonstration;Verbalized understanding       PT Short Term Goals - 11/13/17 1430      PT SHORT TERM GOAL #4   Title  improved left hip ER to >/= 60 degrees passively    Time  4    Period  Weeks    Status  Achieved        PT Long Term Goals - 11/14/17 0950      PT LONG TERM GOAL #1   Title  I with advanced HEP for core stability (02/05/18)     Time  12    Period  Weeks    Status  On-going      PT LONG TERM GOAL #2   Title  report =/> 75% reduction of Lt LE symptoms with walking ( 02/05/18)     Time  12    Period  Weeks    Status  On-going      PT LONG TERM GOAL #3   Title  improve FOTO =/< 29% limited ( 02/05/18)      Time  12    Period  Weeks    Status  On-going scored 33% limited - same as initial eval      PT LONG TERM GOAL #4   Title  maintain level pelvic, not Lt ilium rotation =/> 1 wk (11/02/17)     Status  Achieved      PT LONG TERM GOAL #5   Title  demo bilat hip strength =/> 5-/5 to allow her to start a walking program (02/05/18)     Time  12    Period  Weeks    Status  Partially Met            Plan - 11/27/17 1221    Clinical Impression Statement  Patient has increased difficulty with pelvic tile due to trouble with dissosciating the pelvis from the spine and coordination.  Patient has decreased mobility of thoracic and lumbar spine.  Patient was able to tolerate therapist to work on the introitus for first time and not have pain greater than 3/10.  Patient reports anal intercourse is 20% better.  Patient will benefit from skilled therapy to release tissue and reduce pain to improve lumbar pelvic coordination.     Rehab Potential  Excellent    Clinical Impairments Affecting Rehab Potential  None    PT Duration  12 weeks    PT Treatment/Interventions  Biofeedback;Cryotherapy;Electrical Stimulation;Moist Heat;Ultrasound;Therapeutic activities;Therapeutic exercise;Patient/family education;Neuromuscular re-education;Manual techniques;Scar mobilization;Passive range of motion;Dry needling    PT Next Visit Plan  soft tissue work externally and possible internally, core strength, abdominal soft tissue work; pelvic circles and tilts, expand the chest    PT Home Exercise Plan  Progress as needed    Consulted and Agree with Plan of Care  Patient       Patient will benefit from skilled therapeutic intervention in order to improve the following deficits and impairments:  Pain, Increased fascial restricitons, Decreased coordination, Decreased mobility, Increased muscle spasms, Decreased scar mobility, Decreased endurance, Decreased range of motion, Decreased strength, Decreased activity  tolerance  Visit Diagnosis: Other muscle spasm  Abnormal posture  Muscle weakness (generalized)  Cramp and spasm     Problem List Patient Active Problem List   Diagnosis Date Noted  . Spondylosis of lumbosacral region without myelopathy or radiculopathy 09/07/2017  . Generalized abdominal pain 10/11/2016  . Dyspareunia in female 08/25/2015  . Irregular menses 04/16/2013  . Vitamin D deficiency 04/16/2013  . H/O: hypothyroidism 11/14/2012    Earlie Counts, PT 11/27/17 12:47 PM    [facility] [address] [place], [place], [address] Phone: [phone]   Fax:  [phone]  Name: Jamie Miller MRN: [id number] Date of Birth: [birth year]

## 2017-11-29 ENCOUNTER — Encounter: Payer: 59 | Admitting: Physical Therapy

## 2017-11-30 ENCOUNTER — Telehealth: Payer: Self-pay | Admitting: Obstetrics & Gynecology

## 2017-11-30 NOTE — Telephone Encounter (Signed)
Patient says she is still having a discharge and would like to schedule an appointment.

## 2017-11-30 NOTE — Telephone Encounter (Signed)
Spoke with patient. Patient states she has had ongoing vaginal discharge for a month. Would like to be seen for further evaluation Patient denies chills, fever, nausea, vomiting, diarrhea, dysuria and bleeding. Appointment scheduled for 12/03/2017 at 12:45 pm with Dr.Miller. Patient is agreeable to date and time.  Routing to provider for final review. Patient agreeable to disposition. Will close encounter.

## 2017-12-03 ENCOUNTER — Ambulatory Visit (INDEPENDENT_AMBULATORY_CARE_PROVIDER_SITE_OTHER): Payer: 59 | Admitting: Obstetrics & Gynecology

## 2017-12-03 ENCOUNTER — Encounter: Payer: Self-pay | Admitting: Obstetrics & Gynecology

## 2017-12-03 VITALS — BP 110/66 | HR 94 | Temp 97.4°F | Resp 16 | Wt 149.0 lb

## 2017-12-03 DIAGNOSIS — N898 Other specified noninflammatory disorders of vagina: Secondary | ICD-10-CM

## 2017-12-03 DIAGNOSIS — N912 Amenorrhea, unspecified: Secondary | ICD-10-CM

## 2017-12-03 LAB — POCT URINE PREGNANCY: PREG TEST UR: NEGATIVE

## 2017-12-03 NOTE — Progress Notes (Signed)
GYNECOLOGY  VISIT  CC:   Vaginal discharge  HPI: 47 y.o. G68P0101 Married Caucasian female here for vaginal discharge that has been present for several months.  It doesn't have much of an odor.  Denies pelvic pain.    Did see urologist in February.  Confirmed she was having bladder spasm.  Doing pelvic PT and this has been helping.    Denies vaginal bleeding since October.  Denies vaginal dryness.     GYNECOLOGIC HISTORY: Patient's last menstrual period was 06/04/2017 (approximate). Contraception: vasectomy Menopausal hormone therapy: none  Patient Active Problem List   Diagnosis Date Noted  . Spondylosis of lumbosacral region without myelopathy or radiculopathy 09/07/2017  . Generalized abdominal pain 10/11/2016  . Dyspareunia in female 08/25/2015  . Irregular menses 04/16/2013  . Vitamin D deficiency 04/16/2013  . H/O: hypothyroidism 11/14/2012    Past Medical History:  Diagnosis Date  . Allergy   . Anxiety   . Asthma   . Chicken pox   . OAB (overactive bladder)   . Thyroid disease    was on low dose synthroid at one time, but removed and levels have been "normal" since and followed thorugh GYN  . Vitamin D deficiency     Past Surgical History:  Procedure Laterality Date  . APPENDECTOMY  1990  . BREAST BIOPSY  2000   biopsy  . CERVICAL CERCLAGE  2009  . HYMENECTOMY  2005  . WISDOM TOOTH EXTRACTION      MEDS:   Current Outpatient Medications on File Prior to Visit  Medication Sig Dispense Refill  . cholecalciferol (VITAMIN D) 1000 units tablet Take 2,000 Units by mouth daily.    Marland Kitchen loratadine (CLARITIN) 10 MG tablet Take 10 mg by mouth daily.    . MULTIPLE VITAMIN PO Take 1 tablet by mouth daily.    . Probiotic Product (ACIDOPHILUS/GOAT MILK) CAPS Take by mouth.    . vitamin E 200 UNIT capsule Take 200 Units by mouth daily.     No current facility-administered medications on file prior to visit.     ALLERGIES: Dairy aid  [lactase]; Eggs or egg-derived  products; Hemp seed oil  [dronabinol]; Levothyroxine; Metronidazole; Nitrofurantoin monohyd macro; Strawberry (diagnostic); Wheat bran; Almond oil; Keflex [cephalexin]; Nickel; Other; Clindamycin; and Cranberry  Family History  Problem Relation Age of Onset  . Hyperlipidemia Mother   . Diabetes Father   . Prostate cancer Father        prostate  . Prostate cancer Maternal Grandfather        prostate  . Prostate cancer Paternal Grandfather        prostate  . Arthritis Maternal Grandmother   . Lung cancer Paternal Grandmother     SH:  Married, non smoker  Review of Systems  All other systems reviewed and are negative.   PHYSICAL EXAMINATION:    BP 110/66 (BP Location: Left Arm, Patient Position: Sitting, Cuff Size: Normal)   Pulse 94   Temp (!) 97.4 F (36.3 C) (Oral)   Resp 16   Wt 149 lb (67.6 kg)   LMP 06/04/2017 (Approximate)   BMI 25.58 kg/m     General appearance: alert, cooperative and appears stated age Abdomen: soft, non-tender; bowel sounds normal; no masses,  no organomegaly  Pelvic: External genitalia:  no lesions              Urethra:  normal appearing urethra with no masses, tenderness or lesions  Bartholins and Skenes: normal                 Vagina: normal appearing vagina with normal color and discharge, no lesions              Cervix: no lesions              Bimanual Exam:  Uterus:  normal size, contour, position, consistency, mobility, non-tender              Adnexa: no mass, fullness, tenderness              Anus:  no lesions  Chaperone was present for exam.  Assessment: Vaginal discharge Amenorrhea  Plan: [place] obtained today Affirm pending today.  If menopausal and affirm is negative, will consider starting with Vit E vaginal suppositories.

## 2017-12-03 NOTE — Addendum Note (Signed)
Addended by: Polly Cobia on: 12/03/2017 04:45 PM   Modules accepted: Orders

## 2017-12-04 LAB — VAGINITIS/VAGINOSIS, DNA PROBE
CANDIDA SPECIES: NEGATIVE
GARDNERELLA VAGINALIS: POSITIVE — AB
TRICHOMONAS VAG: NEGATIVE

## 2017-12-04 LAB — FOLLICLE STIMULATING HORMONE: FSH: 42.8 m[IU]/mL

## 2017-12-06 ENCOUNTER — Ambulatory Visit: Payer: 59 | Attending: Family Medicine | Admitting: Physical Therapy

## 2017-12-06 ENCOUNTER — Encounter: Payer: Self-pay | Admitting: Physical Therapy

## 2017-12-06 DIAGNOSIS — M6281 Muscle weakness (generalized): Secondary | ICD-10-CM

## 2017-12-06 DIAGNOSIS — M5442 Lumbago with sciatica, left side: Secondary | ICD-10-CM | POA: Diagnosis present

## 2017-12-06 DIAGNOSIS — M62838 Other muscle spasm: Secondary | ICD-10-CM | POA: Diagnosis present

## 2017-12-06 DIAGNOSIS — G8929 Other chronic pain: Secondary | ICD-10-CM | POA: Diagnosis present

## 2017-12-06 DIAGNOSIS — R293 Abnormal posture: Secondary | ICD-10-CM | POA: Diagnosis not present

## 2017-12-06 DIAGNOSIS — R252 Cramp and spasm: Secondary | ICD-10-CM

## 2017-12-06 NOTE — Therapy (Signed)
[facility] [address], [place], [place], [address] Phone: [phone]   Fax:  [phone]  Physical Therapy Treatment  Patient Details  Name: Jamie Miller MRN: [id number] Date of Birth: June 25, 1971 Referring Provider: Dr. Howard Pouch   Encounter Date: 12/06/2017  PT End of Session - 12/06/17 0927    Visit Number  15    Date for PT Re-Evaluation  02/05/18    Authorization Type  UHC    PT Start Time  0845    PT Stop Time  0928    PT Time Calculation (min)  43 min    Activity Tolerance  Patient tolerated treatment well;No increased pain    Behavior During Therapy  WFL for tasks assessed/performed       Past Medical History:  Diagnosis Date  . Allergy   . Anxiety   . Asthma   . Bladder spasms 10/2017   doing pelvic floor PT  . Chicken pox   . OAB (overactive bladder)   . Thyroid disease    was on low dose synthroid at one time, but removed and levels have been "normal" since and followed thorugh GYN  . Vitamin D deficiency     Past Surgical History:  Procedure Laterality Date  . APPENDECTOMY  1990  . BREAST BIOPSY  2000   biopsy  . CERVICAL CERCLAGE  2009  . HYMENECTOMY  2005  . WISDOM TOOTH EXTRACTION      There were no vitals filed for this visit.  Subjective Assessment - 12/06/17 0849    Subjective  My back is a little worse today and I lifted and increased pain. Patient gets pain in the vagina when I was sitting and leaves immediately. I only leaked once this week when I was bending down.  I can go several hours  to urinate and not think about it. I have not had a bowel movement in 1.5 days. I had a vaginal exam and no increase in pain compared to other.     Pertinent History  PT for urinary incontinence 2009.      Diagnostic tests  x-rays of spine degenerative changes    Patient Stated Goals  not worry about urinary leakage, decreased pain with intercourse    Currently in Pain?  Yes    Pain Score   1     Pain Location  Abdomen    Pain Orientation  Right;Lower    Pain Descriptors / Indicators  Discomfort    Pain Type  Chronic pain    Pain Onset  More than a month ago    Pain Frequency  Constant    Aggravating Factors   intercourse, caffiene    Pain Relieving Factors  heat    Multiple Pain Sites  Yes    Pain Score  8    Pain Location  Back    Pain Orientation  Lower;Upper    Pain Descriptors / Indicators  Aching    Pain Type  Acute pain    Pain Onset  Yesterday    Pain Frequency  Constant    Aggravating Factors   movement    Pain Relieving Factors  heat and stretches, not moving         [facility] PT Assessment - 12/06/17 0001      AROM   Lumbar Extension  50% limitation with no pain    Lumbar - Right Side Bend  decreased by 50%    Lumbar - Left Side  Bend  decreased by 50%                   [facility] Adult PT Treatment/Exercise - 12/06/17 0001      Lumbar Exercises: Stretches   Prone on Elbows Stretch  1 rep;30 seconds      Modalities   Modalities  Electrical Stimulation;Moist Heat      Moist Heat Therapy   Number Minutes Moist Heat  20 Minutes while treating the pelvic floor    Moist Heat Location  Lumbar Spine thoracic      Electrical Stimulation   Electrical Stimulation Location  back    Electrical Stimulation Action  IFC    Electrical Stimulation Parameters  to patient tolerance; 5 min, stopped due to headache and cramping    Electrical Stimulation Goals  Pain      Manual Therapy   Manual Therapy  Joint mobilization;Myofascial release    Joint Mobilization  grade 3 mobilization of T4-L5 to increase thoracic extension and lumbar lordosis    Myofascial Release  to the perineum to release the 3 planes of fascia with stretch feeling and no pain               PT Short Term Goals - 11/13/17 1430      PT SHORT TERM GOAL #4   Title  improved left hip ER to >/= 60 degrees passively    Time  4    Period  Weeks    Status  Achieved        PT  Long Term Goals - 12/06/17 4818      PT LONG TERM GOAL #1   Title  I with advanced HEP for core stability (02/05/18)     Baseline  still learning    Time  12    Period  Weeks    Status  On-going      PT LONG TERM GOAL #2   Title  report =/> 75% reduction of Lt LE symptoms with walking ( 02/05/18)     Baseline  50-60%    Time  12    Period  Weeks    Status  On-going      PT LONG TERM GOAL #4   Title  maintain level pelvic, not Lt ilium rotation =/> 1 wk (11/02/17)     Time  6    Period  Weeks    Status  Achieved      PT LONG TERM GOAL #5   Title  demo bilat hip strength =/> 5-/5 to allow her to start a walking program (02/05/18)     Time  12    Period  Weeks    Status  Partially Met      PT LONG TERM GOAL #6   Title  pain with intercourse decreased >/= 50% due to ability to relax the pelvic floor and understand vaginal health    Baseline  not able to do at this time    Time  8    Period  Weeks    Status  On-going      PT LONG TERM GOAL #7   Title  urinary leakage decreased >/= 75% due to improve circular contraction of pelvic floor and increased strength    Baseline  40% better    Time  8    Period  Weeks    Status  On-going      PT LONG TERM GOAL #8   Title  understand how to use  moisturizers and lubricants for the vaginal area for good vaginal health    Time  8    Period  Weeks    Status  On-going            Plan - 12/06/17 0855    Clinical Impression Statement  Patient will be coming to [place] for back and pelvic floor treatment.  Patient increased her back pain due to lifting wrong the other day.  Patient had no increased pain with pelvic exam for first time.  Patient only leaked urine 1 time since last visit when she bent forward.  Patient is able to wait several hours before urinating.  Patient will benefit from skilled therapy to release tissue and reduce pain to  improve lumbar pelvic coordination.     Rehab Potential  Excellent    Clinical Impairments  Affecting Rehab Potential  None    PT Frequency  1x / week    PT Duration  12 weeks    PT Treatment/Interventions  Biofeedback;Cryotherapy;Electrical Stimulation;Moist Heat;Ultrasound;Therapeutic activities;Therapeutic exercise;Patient/family education;Neuromuscular re-education;Manual techniques;Scar mobilization;Passive range of motion;Dry needling    PT Next Visit Plan  soft tissue work externally and possible internally, core strength, abdominal soft tissue work; pelvic circles and tilts, expand the chest    PT Home Exercise Plan  Progress as needed    Consulted and Agree with Plan of Care  Patient       Patient will benefit from skilled therapeutic intervention in order to improve the following deficits and impairments:  Pain, Increased fascial restricitons, Decreased coordination, Decreased mobility, Increased muscle spasms, Decreased scar mobility, Decreased endurance, Decreased range of motion, Decreased strength, Decreased activity tolerance  Visit Diagnosis: Abnormal posture  Muscle weakness (generalized)  Cramp and spasm  Chronic left-sided low back pain with left-sided sciatica     Problem List Patient Active Problem List   Diagnosis Date Noted  . Spondylosis of lumbosacral region without myelopathy or radiculopathy 09/07/2017  . Generalized abdominal pain 10/11/2016  . Dyspareunia in female 08/25/2015  . Irregular menses 04/16/2013  . Vitamin D deficiency 04/16/2013  . H/O: hypothyroidism 11/14/2012    Earlie Counts, PT 12/06/17 9:31 AM    [facility] [address], [place], [place], [address] Phone: [phone]   Fax:  [phone]  Name: Jamie Miller MRN: [id number] Date of Birth: 1971-07-16

## 2017-12-07 ENCOUNTER — Encounter: Payer: Self-pay | Admitting: Obstetrics & Gynecology

## 2017-12-07 ENCOUNTER — Telehealth: Payer: Self-pay | Admitting: *Deleted

## 2017-12-07 ENCOUNTER — Other Ambulatory Visit: Payer: Self-pay | Admitting: Obstetrics & Gynecology

## 2017-12-07 MED ORDER — CLINDAMYCIN PHOSPHATE 2 % VA CREA: 1.0000 | TOPICAL_CREAM | Freq: Every day | VAGINAL | 0 refills | Status: DC

## 2017-12-07 NOTE — Telephone Encounter (Signed)
-----   Message from Megan Salon, MD sent at 12/07/2017  1:15 PM EDT ----- Please let pt know her vaginitis testing showed BV.  Ok to treat with Metrogel 4.23%, one applicator QHS x 5 nights OR flagyl 500mg  bid x 7 days.  If pt chooses oral medication, please advise no ETOH while on medication.  No additional follow up is needed if symptoms resolve.  Also, Duncan was in menopausal range.  She does need to call if she has future bleeding.  Thanks.

## 2017-12-10 ENCOUNTER — Telehealth: Payer: Self-pay | Admitting: Obstetrics & Gynecology

## 2017-12-10 NOTE — Telephone Encounter (Signed)
Routing to Dr. Lestine Box, will close encounter.

## 2017-12-10 NOTE — Telephone Encounter (Signed)
Patient sent the following correspondence through [place]. Routing to triage to assist patient with request.  ----- Message from [place], Generic sent at 12/10/2017 11:28 AM EDT -----    Hi Dr, Sabra Heck,    I'll start the prescription tomorrow morning then. I was really hopeful I could use the borac acid so I didn't start the medicine this morning until I heard from you. This weekend I started taking a few tablespoons of apple cider vinegar to help the ph level and I have seen a tiny improvement with the discharge. I really don't want to go through side effects again. Perhaps won't.    Jamie Miller  ----- Message -----  From: Megan Salon, MD  Sent: 12/09/2017 10:12 PM EDT  To: Jamie Miller  Subject: RE: Visit Follow-Up Question  Boric acid is not recommended as initial therapy and is typically used to treat recurrent bacterial vaginosis only. So, that would not be my recommendation. Good thought, though.    Edwinna Areola      ----- Message -----   From: Jamie Miller   Sent: 12/07/2017 5:58 PM EDT    To: Megan Salon, MD  Subject: Visit Follow-Up Question    Dr. Sabra Heck,    I just talked to my husband about the diagnosis and he reminded me that I took borac acid vaginally. Is that something that I could use to treat the bacteria? I have never had a problem with the borac acid before.     Thanks so much,  [employer]

## 2017-12-10 NOTE — Telephone Encounter (Signed)
Notes recorded by Polly Cobia, CMA on 12/07/2017 at 4:51 PM EDT Pt notified by Dr. Sabra Heck

## 2017-12-12 ENCOUNTER — Encounter: Payer: Self-pay | Admitting: Obstetrics & Gynecology

## 2017-12-12 ENCOUNTER — Telehealth: Payer: Self-pay | Admitting: Obstetrics & Gynecology

## 2017-12-12 NOTE — Telephone Encounter (Signed)
Ok to continue with Clindamycin cream, but needs to stop if she starts developing diarrhea or a rash. For the itching, she may try Claritin.   Cc- Dr. Sabra Heck

## 2017-12-12 NOTE — Telephone Encounter (Signed)
Spoke with patient, advised as seen below per Dr. Quincy Simmonds. Patient verbalizes understanding and is agreeable. Will close encounter.

## 2017-12-12 NOTE — Telephone Encounter (Signed)
Spoke with patient. Started clindamycin vaginal cream on 4/9 for tx of BV. Reports "mild stomach ache" and itching all over "only when she thinks about it".  Took 2nd dose this morning, no new symptoms.   Denies redness, swelling, rash, wheezing, SOB.   1. Asking if ok to continue medication?   2. How long should cream be used? Advised 5 days.   Advised Dr. Sabra Heck is out of the office, will review with covering provider and return call.  Dr. Quincy Simmonds -please review and advise?   Cc: Dr. Sabra Heck

## 2017-12-12 NOTE — Telephone Encounter (Signed)
Message   ----- Message from [place], Generic sent at 12/12/2017 7:42 AM EDT -----    Hello Dr. Sabra Heck,    I am having mild stomach ache but tolerable after taking the medicine yesterday. I am itchy many places but my husband said he didn't see that in the paper work and it could be my anxiety thinking about side effects.    Also, the pharmacy did not put how long I should use the medicine/applicators. Could you please tell me how many days to take it?    Thank you,  Jamie Miller

## 2017-12-14 ENCOUNTER — Encounter: Payer: Self-pay | Admitting: Physical Therapy

## 2017-12-14 ENCOUNTER — Ambulatory Visit: Payer: 59 | Admitting: Physical Therapy

## 2017-12-14 DIAGNOSIS — M6281 Muscle weakness (generalized): Secondary | ICD-10-CM

## 2017-12-14 DIAGNOSIS — G8929 Other chronic pain: Secondary | ICD-10-CM

## 2017-12-14 DIAGNOSIS — M62838 Other muscle spasm: Secondary | ICD-10-CM

## 2017-12-14 DIAGNOSIS — R293 Abnormal posture: Secondary | ICD-10-CM | POA: Diagnosis not present

## 2017-12-14 DIAGNOSIS — R252 Cramp and spasm: Secondary | ICD-10-CM

## 2017-12-14 DIAGNOSIS — M5442 Lumbago with sciatica, left side: Secondary | ICD-10-CM

## 2017-12-14 NOTE — Therapy (Signed)
[facility] [address], [place], [place], [address] Phone: [phone]   Fax:  [phone]  Physical Therapy Treatment  Patient Details  Name: Jamie Miller MRN: [id number] Date of Birth: October 19, 1970 Referring Provider: Dr. Howard Pouch   Encounter Date: 12/14/2017  PT End of Session - 12/14/17 0802    Visit Number  16    Date for PT Re-Evaluation  02/05/18    Authorization Type  UHC    PT Start Time  0800    PT Stop Time  0840    PT Time Calculation (min)  40 min    Activity Tolerance  Patient tolerated treatment well;No increased pain    Behavior During Therapy  WFL for tasks assessed/performed       Past Medical History:  Diagnosis Date  . Allergy   . Anxiety   . Asthma   . Bladder spasms 10/2017   doing pelvic floor PT  . Chicken pox   . OAB (overactive bladder)   . Thyroid disease    was on low dose synthroid at one time, but removed and levels have been "normal" since and followed thorugh GYN  . Vitamin D deficiency     Past Surgical History:  Procedure Laterality Date  . APPENDECTOMY  1990  . BREAST BIOPSY  2000   biopsy  . CERVICAL CERCLAGE  2009  . HYMENECTOMY  2005  . WISDOM TOOTH EXTRACTION      There were no vitals filed for this visit.  Subjective Assessment - 12/14/17 0806    Subjective  Urinary leakage is 60% better.  Back is 70% better.      Pertinent History  PT for urinary incontinence 2009.      Diagnostic tests  x-rays of spine degenerative changes    Patient Stated Goals  not worry about urinary leakage, decreased pain with intercourse    Currently in Pain?  Yes    Pain Score  4     Pain Location  Abdomen    Pain Orientation  Right;Lower    Pain Descriptors / Indicators  Discomfort    Pain Type  Chronic pain    Pain Onset  More than a month ago    Pain Frequency  Constant    Aggravating Factors   intercourse, caffiene    Pain Relieving Factors  heat    Multiple Pain  Sites  Yes    Pain Score  8    Pain Location  Back    Pain Orientation  Lower;Upper    Pain Descriptors / Indicators  Aching    Pain Type  Acute pain    Pain Onset  Yesterday    Pain Frequency  Constant    Aggravating Factors   movement    Pain Relieving Factors  heat and stretches, not moving                    Pelvic Floor Special Questions - 12/14/17 0001    Pelvic Floor Internal Exam  Patient cofirms identification and approves PT to assess muslce integrity and treatment    Exam Type  Vaginal    Strength  Flicker        [facility] Adult PT Treatment/Exercise - 12/14/17 0001      Lumbar Exercises: Stretches   Prone on Elbows Stretch  60 seconds;1 rep on two pillows, Joint mobilization to lumbar      Lumbar Exercises: Quadruped   [place]  10  reps VC to get motion in lumbar      Manual Therapy   Manual Therapy  Joint mobilization;Internal Pelvic Floor    Joint Mobilization  grade 3 mobilization of T4-L5 to increase thoracic extension and lumbar lordosis    Internal Pelvic Floor  bilateral urethra sphincter, along the posterior introitus with one finger internally and other hand on the abdomen releasing the tissue             PT Education - 12/14/17 0837    Education provided  Yes    Education Details  trunk stretches    Person(s) Educated  Patient    Methods  Explanation;Demonstration;Verbal cues;Handout    Comprehension  Returned demonstration;Verbalized understanding       PT Short Term Goals - 11/13/17 1430      PT SHORT TERM GOAL #4   Title  improved left hip ER to >/= 60 degrees passively    Time  4    Period  Weeks    Status  Achieved        PT Long Term Goals - 12/06/17 9563      PT LONG TERM GOAL #1   Title  I with advanced HEP for core stability (02/05/18)     Baseline  still learning    Time  12    Period  Weeks    Status  On-going      PT LONG TERM GOAL #2   Title  report =/> 75% reduction of Lt LE symptoms with walking  ( 02/05/18)     Baseline  50-60%    Time  12    Period  Weeks    Status  On-going      PT LONG TERM GOAL #4   Title  maintain level pelvic, not Lt ilium rotation =/> 1 wk (11/02/17)     Time  6    Period  Weeks    Status  Achieved      PT LONG TERM GOAL #5   Title  demo bilat hip strength =/> 5-/5 to allow her to start a walking program (02/05/18)     Time  12    Period  Weeks    Status  Partially Met      PT LONG TERM GOAL #6   Title  pain with intercourse decreased >/= 50% due to ability to relax the pelvic floor and understand vaginal health    Baseline  not able to do at this time    Time  8    Period  Weeks    Status  On-going      PT LONG TERM GOAL #7   Title  urinary leakage decreased >/= 75% due to improve circular contraction of pelvic floor and increased strength    Baseline  40% better    Time  8    Period  Weeks    Status  On-going      PT LONG TERM GOAL #8   Title  understand how to use moisturizers and lubricants for the vaginal area for good vaginal health    Time  8    Period  Weeks    Status  On-going            Plan - 12/14/17 0803    Clinical Impression Statement  Patient has difficulty with contracting her pelvic floor due to tightness and her dissociating herself from her pelvic floor. Back is 70% better.  Standing makes the lateral left thigh numbness  is worse and has not changed. Urinary leakage is 60% better.  Patient is able to go longer periods of without urinating.  Tests have indicated early menopause. Therapist able to place her index finger into the introitus and work on the opening for the first time. Patient will benefit from skilled threapy to release tissue and reduce pain to improve lumbar pelvic coordination.     Rehab Potential  Excellent    Clinical Impairments Affecting Rehab Potential  None    PT Frequency  1x / week    PT Duration  12 weeks    PT Treatment/Interventions  Biofeedback;Cryotherapy;Electrical Stimulation;Moist  Heat;Ultrasound;Therapeutic activities;Therapeutic exercise;Patient/family education;Neuromuscular re-education;Manual techniques;Scar mobilization;Passive range of motion;Dry needling    PT Next Visit Plan  soft tissue work externally and possible internally, core strength, abdominal soft tissue work; pelvic circles and tilts, expand the chest    PT Home Exercise Plan  Access Code: [email or web address]    Consulted and Agree with Plan of Care  Patient       Patient will benefit from skilled therapeutic intervention in order to improve the following deficits and impairments:  Pain, Increased fascial restricitons, Decreased coordination, Decreased mobility, Increased muscle spasms, Decreased scar mobility, Decreased endurance, Decreased range of motion, Decreased strength, Decreased activity tolerance  Visit Diagnosis: Abnormal posture  Muscle weakness (generalized)  Cramp and spasm  Chronic left-sided low back pain with left-sided sciatica  Other muscle spasm     Problem List Patient Active Problem List   Diagnosis Date Noted  . Spondylosis of lumbosacral region without myelopathy or radiculopathy 09/07/2017  . Generalized abdominal pain 10/11/2016  . Dyspareunia in female 08/25/2015  . Irregular menses 04/16/2013  . Vitamin D deficiency 04/16/2013  . H/O: hypothyroidism 11/14/2012   Earlie Counts, PT 12/14/17 8:45 AM   [facility] [address] [place], [place], [address] Phone: [phone]   Fax:  [phone]  Name: Jamie Miller MRN: [id number] Date of Birth: August 02, 1971

## 2017-12-17 ENCOUNTER — Encounter: Payer: Self-pay | Admitting: Obstetrics & Gynecology

## 2017-12-17 ENCOUNTER — Telehealth: Payer: Self-pay | Admitting: Obstetrics & Gynecology

## 2017-12-17 NOTE — Telephone Encounter (Signed)
Spoke with patient. Patient states that her LMP was in 06/2017. Started her menses at 10 am this morning. Reports flow is normal. Having increased cramping. Has not taken any medication. Advised can take OTC Ibuprofen/Motrin/Advil 600-800 mg every 6-8 hours for cramping. Reports she finished 5 days course of Clindamycin cream on Saturday. Advised as she is perimenopausal it is not uncommon to still have menses. Advised will review with Dr.Miller and return call with recommendations.

## 2017-12-17 NOTE — Telephone Encounter (Signed)
-----   Message from [place], Generic sent at 12/17/2017 10:46 AM EDT -----    Hi, Dr. Sabra Heck,    Just wanted you to know that my cycle and its cramping started this morning. This is the first time since October, 2018.    Thanks,  Christella Noa

## 2017-12-17 NOTE — Telephone Encounter (Signed)
It is ok to monitor bleeding as long as it stops within the next 7 days.  Thanks.

## 2017-12-18 ENCOUNTER — Telehealth: Payer: Self-pay | Admitting: Obstetrics & Gynecology

## 2017-12-18 ENCOUNTER — Encounter: Payer: Self-pay | Admitting: Obstetrics & Gynecology

## 2017-12-18 NOTE — Telephone Encounter (Signed)
Left message to call Dennard Vezina at [phone]. 

## 2017-12-18 NOTE — Telephone Encounter (Signed)
Patient sent the following correspondence through [place]. Routing to triage to assist patient with request.  ----- Message from Mychart, Generic sent at 12/18/2017 8:59 AM EDT -----    Dear [name],    I am sorry I missed your phone call. I called back, but didn't get through at the time. Unfortunately, I won't be able to stay by the phone at home today, so the receptionist said you could send your message via [facility].     My symptoms are the same as yesterday, meaning it is like a normal cycle except that the cramps may be a bit more. I have taken ibuprofen and it is helping a little.     The only other difference is I have the feeling of nausea come and go since yesterday morning after cycle started. It is just a slight feeling mixed with a little upset stomach and may be unrelated but wanted to let you all know. My brain tends to over think symptoms when I have to focus on them and it could just be that.    Thanks very much,  Jamie Miller

## 2017-12-18 NOTE — Telephone Encounter (Signed)
Patient is returning a call to [place]. Patient states you may leave details on MyChart if she does not answer.

## 2017-12-20 NOTE — Telephone Encounter (Signed)
Routing to [place] for review before closing.

## 2017-12-20 NOTE — Telephone Encounter (Signed)
I would recommend proceeding with ultrasound if her bleeding lasts over a week.  It is not worrisome that this is the first cycle since October.  It is common to have an occasional bleeding episode as she is transitioning into menopause.

## 2017-12-20 NOTE — Telephone Encounter (Signed)
Spoke with patient, advised as seen below per Dr. Sabra Heck. Patient aware to return call to office to schedule PUS if bleeding does not resolve. Patient verbalizes understanding and is agreeable, will close encounter.

## 2017-12-24 ENCOUNTER — Telehealth: Payer: Self-pay | Admitting: Obstetrics & Gynecology

## 2017-12-24 NOTE — Telephone Encounter (Signed)
Patient sent the following correspondence through [place]. Routing to triage to assist patient with request.  ----- Message from [place], Generic sent at 12/24/2017 1:44 PM EDT -----    Algis Liming,    I realize Dr. Sabra Heck is out this week, but wanted to let you know that my discharge has returned. My cycle ended Sunday and today (Monday) I noticed discharge again. It is yellow/clearish color. There is no odor or pain associated with it. Didn't know if it could be related to the discharge Dr. Sabra Heck tested a few weeks ago, related to the cycle, or something entirely different.    Thanks very much,  Jamie Miller  ----- Message -----  From: Nurse Naaman Plummer  Sent: 12/18/2017 11:23 AM EDT  To: Jamie Miller  Subject: Symptoms  Jamie Miller,    As long as your bleeding remains the same it is okay to continue to monitor. If your bleeding becomes heavy, we would recommend that you contact the office for an appointment. If your cramping is relieved with Ibuprofen/Motrin okay to continue taking 600-800 mg every 6-8 hours. If your cramping is increased and not relieved with medication please schedule an appointment to be seen with Dr.Miller. If your symptoms are worsening or not relieved please call to schedule an office visit.    Sincerely,     Reesa Chew, RN

## 2017-12-24 NOTE — Telephone Encounter (Signed)
Left message to call Felipa Laroche at [phone].  

## 2017-12-25 NOTE — Telephone Encounter (Signed)
Call to patient. Patient states that her cycle started on 12/17/17 and she is now experiencing yellowish/clear discharge. Patient denies pain and fever. RN advised could schedule appointment with covering provider as Dr. Sabra Heck is out of the office this week. Patient requests to wait and schedule with Dr. Sabra Heck if that is okay. OV scheduled for Thursday 01/03/18 at 1500. Patient agreeable to date and time of appointment. Patient aware to return call to office in the interim if she begins to experience pain or fever associated with discharge. Patient also states she completed the 5 days of clindamycin cream on 12/15/17 and asking if she should continue use as she has some left? RN advised would review with Dr. Talbert Nan and return call. Patient agreeable.   Routing to covering provider for review.

## 2017-12-25 NOTE — Telephone Encounter (Signed)
Patient called returning call from [place].

## 2017-12-25 NOTE — Telephone Encounter (Signed)
I would recommend evaluation prior to treatment

## 2017-12-26 NOTE — Telephone Encounter (Signed)
Spoke with patient, advised as seen below per Dr. Jertson. Patient verbalizes understanding and is agreeable. Will close encounter.  

## 2018-01-01 ENCOUNTER — Encounter: Payer: Self-pay | Admitting: Obstetrics & Gynecology

## 2018-01-03 ENCOUNTER — Encounter: Payer: Self-pay | Admitting: Obstetrics & Gynecology

## 2018-01-03 ENCOUNTER — Other Ambulatory Visit: Payer: Self-pay

## 2018-01-03 ENCOUNTER — Ambulatory Visit (INDEPENDENT_AMBULATORY_CARE_PROVIDER_SITE_OTHER): Payer: 59 | Admitting: Obstetrics & Gynecology

## 2018-01-03 VITALS — BP 110/66 | HR 88 | Resp 16 | Ht 64.0 in | Wt 150.0 lb

## 2018-01-03 DIAGNOSIS — N898 Other specified noninflammatory disorders of vagina: Secondary | ICD-10-CM | POA: Diagnosis not present

## 2018-01-03 LAB — POCT URINALYSIS DIPSTICK
BILIRUBIN UA: NEGATIVE
Glucose, UA: NEGATIVE
Ketones, UA: NEGATIVE
Leukocytes, UA: NEGATIVE
Nitrite, UA: NEGATIVE
Protein, UA: NEGATIVE
RBC UA: NEGATIVE
UROBILINOGEN UA: 0.2 U/dL
pH, UA: 7 (ref 5.0–8.0)

## 2018-01-03 NOTE — Addendum Note (Signed)
Addended by: Polly Cobia on: 01/03/2018 04:15 PM   Modules accepted: Orders

## 2018-01-03 NOTE — Progress Notes (Signed)
GYNECOLOGY  VISIT  CC:   Vaginal discharge  HPI: 47 y.o. G69P0101 Married Caucasian female here for vaginal discharge.  Used vaginal metrogel and did not have side effects with this.  However, after stopping the metrogel her symptoms returned.  Denies vaginal itching.  Denies odor.  Just having discharge.  Denies new urinary symptoms.  Does have bladder spasms but this is not new.  Denies fever and/or back pain.  GYNECOLOGIC HISTORY: Patient's last menstrual period was 12/17/2017 (approximate). Contraception:  Vasectomy  Menopausal hormone therapy: none  Patient Active Problem List   Diagnosis Date Noted  . Spondylosis of lumbosacral region without myelopathy or radiculopathy 09/07/2017  . Generalized abdominal pain 10/11/2016  . Dyspareunia in female 08/25/2015  . Irregular menses 04/16/2013  . Vitamin D deficiency 04/16/2013  . H/O: hypothyroidism 11/14/2012    Past Medical History:  Diagnosis Date  . Allergy   . Anxiety   . Asthma   . Bladder spasms 10/2017   doing pelvic floor PT  . Chicken pox   . OAB (overactive bladder)   . Thyroid disease    was on low dose synthroid at one time, but removed and levels have been "normal" since and followed thorugh GYN  . Vitamin D deficiency     Past Surgical History:  Procedure Laterality Date  . APPENDECTOMY  1990  . BREAST BIOPSY  2000   biopsy  . CERVICAL CERCLAGE  2009  . HYMENECTOMY  2005  . WISDOM TOOTH EXTRACTION      MEDS:   Current Outpatient Medications on File Prior to Visit  Medication Sig Dispense Refill  . cholecalciferol (VITAMIN D) 1000 units tablet Take 2,000 Units by mouth daily.    Marland Kitchen loratadine (CLARITIN) 10 MG tablet Take 10 mg by mouth daily.    . MULTIPLE VITAMIN PO Take 1 tablet by mouth daily.    . Probiotic Product (ACIDOPHILUS/GOAT MILK) CAPS Take by mouth.    . vitamin E 200 UNIT capsule Take 200 Units by mouth daily.     No current facility-administered medications on file prior to visit.      ALLERGIES: Dairy aid  [lactase]; Eggs or egg-derived products; Hemp seed oil  [dronabinol]; Levothyroxine; Metronidazole; Nitrofurantoin monohyd macro; Strawberry (diagnostic); Wheat bran; Almond oil; Keflex [cephalexin]; Nickel; Other; Clindamycin; Clindamycin hcl; and Cranberry  Family History  Problem Relation Age of Onset  . Hyperlipidemia Mother   . Diabetes Father   . Prostate cancer Father        prostate  . Prostate cancer Maternal Grandfather        prostate  . Prostate cancer Paternal Grandfather        prostate  . Arthritis Maternal Grandmother   . Lung cancer Paternal Grandmother     SH:  Married, non smoker  Review of Systems  Genitourinary:       Abnormal discharge menstrual cycle changes   All other systems reviewed and are negative.   PHYSICAL EXAMINATION:    BP 110/66 (BP Location: Right Arm, Patient Position: Sitting, Cuff Size: Large)   Pulse 88   Resp 16   Ht 5\' 4"  (1.626 m)   Wt 150 lb (68 kg)   LMP 12/17/2017 (Approximate)   BMI 25.75 kg/m     General appearance: alert, cooperative and appears stated age Abdomen: soft, non-tender; bowel sounds normal; no masses,  no organomegaly  Pelvic: External genitalia:  no lesions  Urethra:  normal appearing urethra with no masses, tenderness or lesions              Bartholins and Skenes: normal                 Vagina: normal appearing vagina with normal color and discharge, no lesions              Cervix: no lesions              Bimanual Exam:  Uterus:  normal size, contour, position, consistency, mobility, non-tender  Chaperone was present for exam.  Assessment: H/O vaginal discharge and recent BV with continued vaginal discharge   Plan: Repeat BV testing obtained today.  Results and recommendations will be called to pt.

## 2018-01-04 LAB — BACTERIAL VAGINOSIS, NAA

## 2018-01-10 ENCOUNTER — Ambulatory Visit: Payer: 59 | Attending: Family Medicine | Admitting: Physical Therapy

## 2018-01-10 ENCOUNTER — Encounter: Payer: Self-pay | Admitting: Obstetrics & Gynecology

## 2018-01-10 ENCOUNTER — Encounter: Payer: Self-pay | Admitting: Physical Therapy

## 2018-01-10 DIAGNOSIS — M5442 Lumbago with sciatica, left side: Secondary | ICD-10-CM | POA: Insufficient documentation

## 2018-01-10 DIAGNOSIS — R293 Abnormal posture: Secondary | ICD-10-CM | POA: Diagnosis present

## 2018-01-10 DIAGNOSIS — M6281 Muscle weakness (generalized): Secondary | ICD-10-CM | POA: Diagnosis present

## 2018-01-10 DIAGNOSIS — R252 Cramp and spasm: Secondary | ICD-10-CM | POA: Diagnosis present

## 2018-01-10 DIAGNOSIS — M62838 Other muscle spasm: Secondary | ICD-10-CM | POA: Diagnosis present

## 2018-01-10 DIAGNOSIS — G8929 Other chronic pain: Secondary | ICD-10-CM | POA: Diagnosis present

## 2018-01-10 NOTE — Telephone Encounter (Signed)
Patient sent the following message through [place]. Routing to triage to assist patient with request.  ----- Message from [place], Generic sent at 01/10/2018 8:10 AM EDT -----    Hi, Dr. Sabra Heck,    [place] suggested I let you know when my cycle starts. It started with 2 dots yesterday afternoon and this morning (5/9/) has actually started enough to wear a pad. Am I correct, that this may be 'normal' and not to do anything unless it lasts more than 7 days and I am changing a pad every hour?  Thank you so much,  Jamie Miller    Cycle  October 2018  December 17, 2017  May 8/9, 2019

## 2018-01-10 NOTE — Therapy (Signed)
[facility] [address] [place], [place], [address] Phone: [phone]   Fax:  [phone]  Physical Therapy Treatment  Patient Details  Name: Jamie Miller MRN: [id number] Date of Birth: 1970-10-20 Referring Provider: Dr. Howard Pouch   Encounter Date: 01/10/2018  PT End of Session - 01/10/18 0852    Visit Number  17    Date for PT Re-Evaluation  02/05/18    Authorization Type  UHC    PT Start Time  0848    PT Stop Time  0928    PT Time Calculation (min)  40 min    Activity Tolerance  Patient tolerated treatment well;No increased pain    Behavior During Therapy  WFL for tasks assessed/performed       Past Medical History:  Diagnosis Date  . Allergy   . Anxiety   . Asthma   . Bladder spasms 10/2017   doing pelvic floor PT  . Chicken pox   . OAB (overactive bladder)   . Thyroid disease    was on low dose synthroid at one time, but removed and levels have been "normal" since and followed thorugh GYN  . Vitamin D deficiency     Past Surgical History:  Procedure Laterality Date  . APPENDECTOMY  1990  . BREAST BIOPSY  2000   biopsy  . CERVICAL CERCLAGE  2009  . HYMENECTOMY  2005  . WISDOM TOOTH EXTRACTION      There were no vitals filed for this visit.  Subjective Assessment - 01/10/18 0849    Subjective  My cycle started 5 minutes ago. My numbness in the left leg is 5% better.  My bladder muscles are not as good due to not having therapy.  Back is getting better.     Diagnostic tests  x-rays of spine degenerative changes    Patient Stated Goals  not worry about urinary leakage, decreased pain with intercourse    Currently in Pain?  Yes    Pain Score  6  due to menstrual    Pain Location  Abdomen    Pain Orientation  Right;Left    Pain Descriptors / Indicators  Discomfort;Cramping    Pain Type  Chronic pain    Pain Radiating Towards  does have numbness in the lateral left thigh with rubbing    Pain  Onset  More than a month ago    Pain Frequency  Constant    Aggravating Factors   intercourse, menstural cycle    Pain Relieving Factors  heat                       [facility] Adult PT Treatment/Exercise - 01/10/18 0001      Manual Therapy   Manual Therapy  Soft tissue mobilization    Soft tissue mobilization  obliques, transverse abdominus, lower abdominal, around her abdominal scar               PT Short Term Goals - 11/13/17 1430      PT SHORT TERM GOAL #4   Title  improved left hip ER to >/= 60 degrees passively    Time  4    Period  Weeks    Status  Achieved        PT Long Term Goals - 01/10/18 1013      PT LONG TERM GOAL #7   Title  urinary leakage decreased >/= 75% due to improve circular contraction of pelvic  floor and increased strength    Time  8    Period  Weeks    Status  Achieved      PT LONG TERM GOAL #8   Title  understand how to use moisturizers and lubricants for the vaginal area for good vaginal health    Time  8    Period  Weeks    Status  Achieved            Plan - 01/10/18 1572    Clinical Impression Statement  Patient has not been in therapy due to her daughter home for spring break, other appointments and therapist schedule.  Patient had increased tightness in the lower abdomen and after manual work increased softness.  Patient had many trigger points in the abdomen especially on the left side.  Patient reports no urinary leakage.  Patient was on her cycle so we not do perineal massage.  Patient continues to need tactile and verbal cues to perform HEP.  Patient will benefit from skilled therapy to release tissue and reduce pain to improve lumbar pelvic coordination.     Rehab Potential  Excellent    Clinical Impairments Affecting Rehab Potential  None    PT Frequency  1x / week    PT Duration  12 weeks    PT Treatment/Interventions  Biofeedback;Cryotherapy;Electrical Stimulation;Moist Heat;Ultrasound;Therapeutic  activities;Therapeutic exercise;Patient/family education;Neuromuscular re-education;Manual techniques;Scar mobilization;Passive range of motion;Dry needling    PT Next Visit Plan  soft tissue work externally and possible internally, core strength, abdominal soft tissue work; pelvic circles and tilts, expand the chest    PT Home Exercise Plan  Access Code: [email or web address]     Consulted and Agree with Plan of Care  Patient       Patient will benefit from skilled therapeutic intervention in order to improve the following deficits and impairments:  Pain, Increased fascial restricitons, Decreased coordination, Decreased mobility, Increased muscle spasms, Decreased scar mobility, Decreased endurance, Decreased range of motion, Decreased strength, Decreased activity tolerance  Visit Diagnosis: Abnormal posture  Muscle weakness (generalized)  Cramp and spasm  Chronic left-sided low back pain with left-sided sciatica  Other muscle spasm     Problem List Patient Active Problem List   Diagnosis Date Noted  . Spondylosis of lumbosacral region without myelopathy or radiculopathy 09/07/2017  . Generalized abdominal pain 10/11/2016  . Dyspareunia in female 08/25/2015  . Irregular menses 04/16/2013  . Vitamin D deficiency 04/16/2013  . H/O: hypothyroidism 11/14/2012    Earlie Counts, PT 01/10/18 10:15 AM   [facility] [address] [place], [place], [address] Phone: [phone]   Fax:  [phone]  Name: STANLEY HELMUTH MRN: [id number] Date of Birth: 05/25/1971

## 2018-01-10 NOTE — Patient Instructions (Signed)
Access Code: [email or web address]  URL: [email or web address]  Date: 01/10/2018  Prepared by: Earlie Counts   Exercises  Prone Press Up on Elbows - 1 reps - 1 sets - 1 min hold - 2x daily - 7x weekly  Seated Thoracic Extension and Rotation with Reach - 10 reps - 1 sets - 2x daily - 7x weekly  Seated Thoracic Extenion Arms Overhead - 5 reps - 1 sets - 5 hold - 2x daily - 7x weekly  Doorway Pec Stretch at 90 Degrees Abduction - 2 reps - 1 sets - 30 hold - 1x daily - 7x weekly  Prone Quadriceps Stretch with Strap - 2 reps - 1 sets - 30 hold - 1x daily - 7x weekly  Squat with Chair Touch - 5 reps - 1 sets - 1x daily - 7x weekly  [facility] [address] [place], [place] [address] Phone # [phone] Fax [phone]

## 2018-01-17 ENCOUNTER — Ambulatory Visit: Payer: 59 | Admitting: Physical Therapy

## 2018-01-17 ENCOUNTER — Encounter: Payer: Self-pay | Admitting: Physical Therapy

## 2018-01-17 DIAGNOSIS — M5442 Lumbago with sciatica, left side: Secondary | ICD-10-CM

## 2018-01-17 DIAGNOSIS — R293 Abnormal posture: Secondary | ICD-10-CM

## 2018-01-17 DIAGNOSIS — G8929 Other chronic pain: Secondary | ICD-10-CM

## 2018-01-17 DIAGNOSIS — M6281 Muscle weakness (generalized): Secondary | ICD-10-CM

## 2018-01-17 DIAGNOSIS — R252 Cramp and spasm: Secondary | ICD-10-CM

## 2018-01-17 DIAGNOSIS — M62838 Other muscle spasm: Secondary | ICD-10-CM

## 2018-01-17 NOTE — Patient Instructions (Addendum)
Mid-Back Rotation Stretch    Reach to each side as far as possible, keeping chest low to floor. Hold _30___ seconds. Repeat __1__ times per set. Do _1___ sets per session. Do __1__ sessions per day.  http://orth.exer.us/133   Copyright  VHI. All rights reserved.   PELVIC TILT: Anterior    Start in slumped position. Roll pelvis forward to arch back. _10__ reps per set, _1__ sets per day, 3___ days per week   Copyright  VHI. All rights reserved.  [facility] [address], [place], [place] [address] Phone # [phone] Fax [phone]

## 2018-01-17 NOTE — Therapy (Signed)
[facility] [address] [place], [place], [address] Phone: [phone]   Fax:  [phone]  Physical Therapy Treatment  Patient Details  Name: Jamie Miller MRN: [id number] Date of Birth: Aug 08, 1971 Referring Provider: Dr. Howard Pouch   Encounter Date: 01/17/2018  PT End of Session - 01/17/18 1018    Visit Number  18    Number of Visits  24    Date for PT Re-Evaluation  02/05/18    Authorization Type  UHC    PT Start Time  1941    PT Stop Time  1055    PT Time Calculation (min)  40 min    Activity Tolerance  Patient tolerated treatment well;No increased pain    Behavior During Therapy  WFL for tasks assessed/performed       Past Medical History:  Diagnosis Date  . Allergy   . Anxiety   . Asthma   . Bladder spasms 10/2017   doing pelvic floor PT  . Chicken pox   . OAB (overactive bladder)   . Thyroid disease    was on low dose synthroid at one time, but removed and levels have been "normal" since and followed thorugh GYN  . Vitamin D deficiency     Past Surgical History:  Procedure Laterality Date  . APPENDECTOMY  1990  . BREAST BIOPSY  2000   biopsy  . CERVICAL CERCLAGE  2009  . HYMENECTOMY  2005  . WISDOM TOOTH EXTRACTION      There were no vitals filed for this visit.  Subjective Assessment - 01/17/18 1019    Subjective  No change in my pelvic floor.  I have hurt my back and has been painful. I want to focus on my back.     Pertinent History  PT for urinary incontinence 2009.      Diagnostic tests  x-rays of spine degenerative changes    Patient Stated Goals  not worry about urinary leakage, decreased pain with intercourse    Currently in Pain?  Yes    Pain Score  9     Pain Location  Back    Pain Orientation  Lower    Pain Descriptors / Indicators  Sore;Spasm    Pain Type  Acute pain    Pain Onset  More than a month ago    Pain Frequency  Constant    Aggravating Factors   movement,  sitting in one position for long time    Pain Relieving Factors  sleeping    Multiple Pain Sites  No         [facility] PT Assessment - 01/17/18 0001      Assessment   Medical Diagnosis  N393.3 Stress incontinence of urine/lumbar radiculopathy      AROM   Lumbar Extension  50% limitation with no pain    Lumbar - Right Side Bend  decreased by 50%    Lumbar - Left Side Bend  full      Palpation   SI assessment   pelvis in correct alignment                   [facility] Adult PT Treatment/Exercise - 01/17/18 0001      Lumbar Exercises: Stretches   Lower Trunk Rotation  -- sidely 10 reps bil.     Hip Flexor Stretch  Right;Left;1 rep;30 seconds;Limitations    Hip Flexor Stretch Limitations  manually stretched in prone and hip rotators  Prone on Elbows Stretch  1 rep;60 seconds with breath to increase extension    Quadruped Mid Back Stretch  1 rep;30 seconds bilateral sides    Prone Mid Back Stretch  1 rep;30 seconds      Lumbar Exercises: Prone   Straight Leg Raise  10 reps;1 second bil. with assistance      Lumbar Exercises: Quadruped   Madcat/Old Horse  15 reps;Limitations    Madcat/Old Horse Limitations  needs tactile cues to coordinate the pelvis      Manual Therapy   Manual Therapy  Soft tissue mobilization;Joint mobilization    Joint Mobilization  grade 1 PA mobilization to T5-L1 monitoring pain    Soft tissue mobilization  thoracic and lumbar paraspinals             PT Education - 01/17/18 1058    Education provided  Yes    Education Details  pelvic tilt in sitting and quadruped stretch to side    Person(s) Educated  Patient    Methods  Explanation;Demonstration;Verbal cues;Handout    Comprehension  Returned demonstration;Verbalized understanding       PT Short Term Goals - 11/13/17 1430      PT SHORT TERM GOAL #4   Title  improved left hip ER to >/= 60 degrees passively    Time  4    Period  Weeks    Status  Achieved        PT Long Term  Goals - 01/17/18 1106      PT LONG TERM GOAL #4   Title  maintain level pelvic, not Lt ilium rotation =/> 1 wk (11/02/17)     Time  6    Period  Weeks    Status  Achieved            Plan - 01/17/18 1100    Clinical Impression Statement  Patient physical therapy focused on back pain due to being 9/10.  After therapy her pain decreased to 7/10.  Patient is good with midline pelvic tilt to improve mobility.  Patient has no urinary leakage. Patient does not have to urinate as much. Patient has trouble with moving due to back pain.  Patient continues to have difficulty with relaxing her muscles. Patient will benefit from skilled therapy to release tissue and reduce pain to improve lumbar pelvic coordination.     Rehab Potential  Excellent    Clinical Impairments Affecting Rehab Potential  None    PT Frequency  1x / week    PT Duration  12 weeks    PT Treatment/Interventions  Biofeedback;Cryotherapy;Electrical Stimulation;Moist Heat;Ultrasound;Therapeutic activities;Therapeutic exercise;Patient/family education;Neuromuscular re-education;Manual techniques;Scar mobilization;Passive range of motion;Dry needling    PT Next Visit Plan  soft tissue work externally and possible internally, core strength, abdominal soft tissue work; pelvic circles and tilts, expand the chest    Consulted and Agree with Plan of Care  Patient       Patient will benefit from skilled therapeutic intervention in order to improve the following deficits and impairments:  Pain, Increased fascial restricitons, Decreased coordination, Decreased mobility, Increased muscle spasms, Decreased scar mobility, Decreased endurance, Decreased range of motion, Decreased strength, Decreased activity tolerance  Visit Diagnosis: Abnormal posture  Muscle weakness (generalized)  Cramp and spasm  Chronic left-sided low back pain with left-sided sciatica  Other muscle spasm     Problem List Patient Active Problem List   Diagnosis  Date Noted  . Spondylosis of lumbosacral region without myelopathy or radiculopathy 09/07/2017  .  Generalized abdominal pain 10/11/2016  . Dyspareunia in female 08/25/2015  . Irregular menses 04/16/2013  . Vitamin D deficiency 04/16/2013  . H/O: hypothyroidism 11/14/2012    Earlie Counts, PT 01/17/18 11:09 AM   [facility] [address] [place], [place], [address] Phone: [phone]   Fax:  [phone]  Name: Jamie Miller MRN: [id number] Date of Birth: 09-15-1970

## 2018-01-22 ENCOUNTER — Encounter: Payer: Self-pay | Admitting: Physical Therapy

## 2018-01-22 ENCOUNTER — Ambulatory Visit: Payer: 59 | Admitting: Physical Therapy

## 2018-01-22 DIAGNOSIS — M62838 Other muscle spasm: Secondary | ICD-10-CM

## 2018-01-22 DIAGNOSIS — G8929 Other chronic pain: Secondary | ICD-10-CM

## 2018-01-22 DIAGNOSIS — R293 Abnormal posture: Secondary | ICD-10-CM | POA: Diagnosis not present

## 2018-01-22 DIAGNOSIS — M5442 Lumbago with sciatica, left side: Secondary | ICD-10-CM

## 2018-01-22 DIAGNOSIS — M6281 Muscle weakness (generalized): Secondary | ICD-10-CM

## 2018-01-22 DIAGNOSIS — R252 Cramp and spasm: Secondary | ICD-10-CM

## 2018-01-22 NOTE — Therapy (Signed)
[facility] [address] [place], [place], [address] Phone: [phone]   Fax:  [phone]  Physical Therapy Treatment  Patient Details  Name: Jamie Miller MRN: [id number] Date of Birth: [birth year] Referring Provider: Dr. Howard Pouch   Encounter Date: 01/22/2018  PT End of Session - 01/22/18 1111    Visit Number  19    Number of Visits  24    Date for PT Re-Evaluation  02/05/18    Authorization Type  [employer]    PT Start Time  7902    PT Stop Time  1105    PT Time Calculation (min)  50 min    Activity Tolerance  Patient tolerated treatment well;No increased pain    Behavior During Therapy  WFL for tasks assessed/performed       Past Medical History:  Diagnosis Date  . Allergy   . Anxiety   . Asthma   . Bladder spasms 10/2017   doing pelvic floor PT  . Chicken pox   . OAB (overactive bladder)   . Thyroid disease    was on low dose synthroid at one time, but removed and levels have been "normal" since and followed thorugh GYN  . Vitamin D deficiency     Past Surgical History:  Procedure Laterality Date  . APPENDECTOMY  1990  . BREAST BIOPSY  2000   biopsy  . CERVICAL CERCLAGE  2009  . HYMENECTOMY  2005  . WISDOM TOOTH EXTRACTION      There were no vitals filed for this visit.  Subjective Assessment - 01/22/18 1020    Subjective  No change in pelvic floor.  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     Pertinent History  PT for urinary incontinence 2009.      Diagnostic tests  x-rays of spine degenerative changes    Patient Stated Goals  not worry about urinary leakage, decreased pain with intercourse    Currently in Pain?  Yes    Pain Score  7     Pain Location  Back    Pain Orientation  Lower    Pain Descriptors / Indicators  Sore;Spasm    Pain Type  Acute pain    Pain Onset  More than a month ago    Pain Frequency  Constant    Aggravating Factors   movement, sitting in  one position for long time    Pain Relieving Factors  sleeping    Multiple Pain Sites  No         [facility] PT Assessment - 01/22/18 0001      Strength   Right Hip Flexion  5/5    Right Hip Extension  4-/5    Right Hip ABduction  4+/5    Left Hip Flexion  5/5    Left Hip Extension  4/5    Left Hip ABduction  4+/5                Pelvic Floor Special Questions - 01/22/18 0001    Pelvic Floor Internal Exam  Patient cofirms identification and approves PT to assess muslce integrity and treatment    Exam Type  Vaginal    Palpation  first time for therapist to insert her index finger into the vaginal canal for soft tissue work        [facility] Adult PT Treatment/Exercise - 01/22/18 0001      Lumbar Exercises: Stretches  Single Knee to Chest Stretch  Right;Left;1 rep;30 seconds    Double Knee to Chest Stretch  30 seconds;1 rep    Lower Trunk Rotation  1 rep;30 seconds both side    Hip Flexor Stretch  Right;Left;1 rep;30 seconds sidely    Prone on Elbows Stretch  1 rep;60 seconds with breath to increase extension    Quadruped Mid Back Stretch  1 rep;30 seconds bilateral sides    Prone Mid Back Stretch  1 rep;30 seconds      Lumbar Exercises: Sidelying   Other Sidelying Lumbar Exercises  bring hands across body for pectoralis stretch and trunk rotation 10x bilateral       Manual Therapy   Manual Therapy  Myofascial release;Soft tissue mobilization;Internal Pelvic Floor    Soft tissue mobilization  bil. sides of the obturator internist, along the labia on the pubic rami    Myofascial Release  one hand on abdomen and other on sacrum distraction from the spine with release    Internal Pelvic Floor  along the introitus, along the puborectalis, pubovaginalis, obturator internist and perineal body             PT Education - 01/22/18 1108    Education provided  Yes    Education Details  Access Code: [employer]     Person(s) Educated  Patient    Methods   Explanation;Demonstration;Verbal cues;Handout    Comprehension  Returned demonstration;Verbalized understanding       PT Short Term Goals - 11/13/17 1430      PT SHORT TERM GOAL #4   Title  improved left hip ER to >/= 60 degrees passively    Time  4    Period  Weeks    Status  Achieved        PT Long Term Goals - 01/22/18 1040      PT LONG TERM GOAL #1   Title  I with advanced HEP for core stability (02/05/18)     Time  12    Status  On-going      PT LONG TERM GOAL #2   Title  report =/> 75% reduction of Lt LE symptoms with walking ( 02/05/18)     Baseline  50-60%    Time  12    Period  Weeks    Status  On-going      PT LONG TERM GOAL #4   Title  maintain level pelvic, not Lt ilium rotation =/> 1 wk (11/02/17)     Time  6    Period  Weeks    Status  Achieved      PT LONG TERM GOAL #6   Title  pain with intercourse decreased >/= 50% due to ability to relax the pelvic floor and understand vaginal health    Baseline  no change    Time  8    Period  Weeks    Status  On-going      PT LONG TERM GOAL #7   Title  urinary leakage decreased >/= 75% due to improve circular contraction of pelvic floor and increased strength    Baseline  none    Time  8    Period  Weeks    Status  Achieved            Plan - 01/22/18 1030    Clinical Impression Statement  Patient is not having urinary leakage.  Patient is realizing her back pain is more the buttocks.  Patient has improved sensation on  the left upper thigh. Patient was able to tolerate the therapist placing her index finger into the introitus for the first time due to improve tissue mobility.  Patient continues to have weakness in her hips with poor motor control. After manual work, patient pain decreased to 5/10 from a 7/10. Patient will benefit from skilled therapy to release tissue and reduce pain to improve lumbar pelvic coordination.     Rehab Potential  Excellent    Clinical Impairments Affecting Rehab Potential  None     PT Frequency  1x / week    PT Duration  12 weeks    PT Treatment/Interventions  Biofeedback;Cryotherapy;Electrical Stimulation;Moist Heat;Ultrasound;Therapeutic activities;Therapeutic exercise;Patient/family education;Neuromuscular re-education;Manual techniques;Scar mobilization;Passive range of motion;Dry needling    PT Next Visit Plan  soft tissue work externally and possible internally, core strength, abdominal soft tissue work; pelvic circles and tilts, expand the chest    PT Home Exercise Plan  Access Code: [email or web address]     Consulted and Agree with Plan of Care  Patient       Patient will benefit from skilled therapeutic intervention in order to improve the following deficits and impairments:  Pain, Increased fascial restricitons, Decreased coordination, Decreased mobility, Increased muscle spasms, Decreased scar mobility, Decreased endurance, Decreased range of motion, Decreased strength, Decreased activity tolerance  Visit Diagnosis: Abnormal posture  Muscle weakness (generalized)  Cramp and spasm  Chronic left-sided low back pain with left-sided sciatica  Other muscle spasm     Problem List Patient Active Problem List   Diagnosis Date Noted  . Spondylosis of lumbosacral region without myelopathy or radiculopathy 09/07/2017  . Generalized abdominal pain 10/11/2016  . Dyspareunia in female 08/25/2015  . Irregular menses 04/16/2013  . Vitamin D deficiency 04/16/2013  . H/O: hypothyroidism 11/14/2012    Earlie Counts, PT 01/22/18 11:15 AM   [facility] [address], [place], [place], [address] Phone: [phone]   Fax:  [phone]  Name: Jamie Miller MRN: [id number] Date of Birth: [birth year]

## 2018-01-22 NOTE — Patient Instructions (Addendum)
Access Code: [email or web address]  URL: [email or web address]  Date: 01/22/2018  Prepared by: Earlie Counts   Exercises  Prone Press Up on Elbows - 1 reps - 1 sets - 1 min hold - 2x daily - 7x weekly  Seated Thoracic Extension and Rotation with Reach - 10 reps - 1 sets - 2x daily - 7x weekly  Seated Thoracic Extenion Arms Overhead - 5 reps - 1 sets - 5 hold - 2x daily - 7x weekly  Doorway Pec Stretch at 90 Degrees Abduction - 2 reps - 1 sets - 30 hold - 1x daily - 7x weekly  Prone Quadriceps Stretch with Strap - 2 reps - 1 sets - 30 hold - 1x daily - 7x weekly  Squat with Chair Touch - 5 reps - 1 sets - 1x daily - 7x weekly  Modified [place] - 2 reps - 1 sets - 30 hold - 1x daily - 7x weekly  [facility] [address], [place], [place] [address] Phone # [phone] Fax [phone]

## 2018-01-25 ENCOUNTER — Encounter: Payer: Self-pay | Admitting: *Deleted

## 2018-01-25 ENCOUNTER — Encounter: Payer: Self-pay | Admitting: Family Medicine

## 2018-01-25 ENCOUNTER — Ambulatory Visit (INDEPENDENT_AMBULATORY_CARE_PROVIDER_SITE_OTHER): Payer: 59 | Admitting: Family Medicine

## 2018-01-25 VITALS — BP 125/81 | HR 90 | Temp 98.2°F | Resp 20 | Ht 64.0 in | Wt 150.5 lb

## 2018-01-25 DIAGNOSIS — J029 Acute pharyngitis, unspecified: Secondary | ICD-10-CM

## 2018-01-25 LAB — POCT RAPID STREP A (OFFICE): Rapid Strep A Screen: NEGATIVE

## 2018-01-25 MED ORDER — AMOXICILLIN 400 MG/5ML PO SUSR: 500.0000 mg | Freq: Three times a day (TID) | ORAL | 0 refills | Status: DC

## 2018-01-25 NOTE — Progress Notes (Signed)
Jamie Miller , 06-10-1971, 47 y.o., female MRN: [id number] Patient Care Team    Relationship Specialty Notifications Start End  Ma Hillock, DO PCP - General Family Medicine  10/03/16     Chief Complaint  Patient presents with  . URI    drainage,ears popping     Subjective: Pt presents for an OV with complaints of ears popping, nasal drainage and mild sore throat of 1 day duration. She has started Claritin. She denies fever, chills, nausea, vomit, abd pain, diarrhea or rash. She states her husband tested positive for strep today and he has been sick for 3 days. She is worried she has strep.  Depression screen [facility] 2/9 05/18/2017 10/03/2016  Decreased Interest 0 0  Down, Depressed, Hopeless 0 -  PHQ - 2 Score 0 0  Altered sleeping 0 -  Tired, decreased energy 0 -  Change in appetite 0 -  Feeling bad or failure about yourself  0 -  Trouble concentrating 0 -  Moving slowly or fidgety/restless 0 -  Suicidal thoughts 0 -  PHQ-9 Score 0 -    Allergies  Allergen Reactions  . Dairy Aid  [Lactase] Rash  . Eggs Or Egg-Derived Products Rash  . Hemp Seed Oil  [Dronabinol] Rash    itching  . Levothyroxine     Other reaction(s): Other Tingling in legs; heart palpitation  . Metronidazole     Other reaction(s): Other Cramps  . Nitrofurantoin Monohyd Macro Diarrhea    Other reaction(s): Other Cramps  . Strawberry (Diagnostic) Rash    itching  . Wheat Bran Rash  . Almond Oil   . Keflex [Cephalexin]   . Nickel   . Other     Sensitive to all abx   . Clindamycin     Other reaction(s): Abdominal Pain  . Clindamycin Hcl Other (See Comments)  . Cranberry     Other reaction(s): Abdominal Pain   Social History   Tobacco Use  . Smoking status: Never Smoker  . Smokeless tobacco: Never Used  Substance Use Topics  . Alcohol use: No   Past Medical History:  Diagnosis Date  . Allergy   . Anxiety   . Asthma   . Bladder spasms 10/2017   doing pelvic floor PT  . Chicken pox     . OAB (overactive bladder)   . Thyroid disease    was on low dose synthroid at one time, but removed and levels have been "normal" since and followed thorugh GYN  . Vitamin D deficiency    Past Surgical History:  Procedure Laterality Date  . APPENDECTOMY  1990  . BREAST BIOPSY  2000   biopsy  . CERVICAL CERCLAGE  2009  . HYMENECTOMY  2005  . WISDOM TOOTH EXTRACTION     Family History  Problem Relation Age of Onset  . Hyperlipidemia Mother   . Diabetes Father   . Prostate cancer Father        prostate  . Prostate cancer Maternal Grandfather        prostate  . Prostate cancer Paternal Grandfather        prostate  . Arthritis Maternal Grandmother   . Lung cancer Paternal Grandmother    Allergies as of 01/25/2018      Reactions   Dairy Aid  [lactase] Rash   Eggs Or Egg-derived Products Rash   Hemp Seed Oil  [dronabinol] Rash   itching   Levothyroxine    Other reaction(s): Other Tingling in  legs; heart palpitation   Metronidazole    Other reaction(s): Other Cramps   Nitrofurantoin Monohyd Macro Diarrhea   Other reaction(s): Other Cramps   Strawberry (diagnostic) Rash   itching   Wheat Bran Rash   Almond Oil    Keflex [cephalexin]    Nickel    Other    Sensitive to all abx    Clindamycin    Other reaction(s): Abdominal Pain   Clindamycin Hcl Other (See Comments)   Cranberry    Other reaction(s): Abdominal Pain      Medication List        Accurate as of 01/25/18  1:35 PM. Always use your most recent med list.          Acidophilus/Goat Milk Caps Take by mouth.   cholecalciferol 1000 units tablet Commonly known as:  VITAMIN D Take 2,000 Units by mouth daily.   loratadine 10 MG tablet Commonly known as:  CLARITIN Take 10 mg by mouth daily.   MULTIPLE VITAMIN PO Take 1 tablet by mouth daily.   vitamin E 200 UNIT capsule Take 200 Units by mouth daily.       All past medical history, surgical history, allergies, family history, immunizations  andmedications were updated in the EMR today and reviewed under the history and medication portions of their EMR.     ROS: Negative, with the exception of above mentioned in HPI   Objective:  BP 125/81 (BP Location: Left Arm, Patient Position: Sitting, Cuff Size: Normal)   Pulse 90   Temp 98.2 F (36.8 C)   Resp 20   Ht 5\' 4"  (1.626 m)   Wt 150 lb 8 oz (68.3 kg)   SpO2 99%   BMI 25.83 kg/m  Body mass index is 25.83 kg/m. Gen: Afebrile. No acute distress. Nontoxic in appearance, well developed, well nourished.  HENT: AT. [place]. Bilateral TM visualized with left fullness, mild erythema. MMM, no oral lesions. Bilateral nares with drainage, no redness or swelling. Throat with mild erythema, No exudates. No cough or hoarseness.  Eyes:Pupils Equal Round Reactive to light, Extraocular movements intact,  Conjunctiva without redness, discharge or icterus. Neck/lymp/endocrine: Supple, no  lymphadenopathy CV: RRR Chest: CTAB, no wheeze or crackles. Good air movement, normal resp effort.  Abd: Soft. NTND. BS present.  Skin: no rashes, purpura or petechiae.  Neuro:  Normal gait. PERLA. EOMi. Alert. Oriented x3  No exam data present No results found. Results for orders placed or performed in visit on 01/25/18 (from the past 24 hour(s))  POCT rapid strep A     Status: Normal   Collection Time: 01/25/18  1:24 PM  Result Value Ref Range   Rapid Strep A Screen Negative Negative    Assessment/Plan: Jamie Miller is a 47 y.o. female present for OV for  Sore throat/pharyngitis - POCT rapid strep A--> negative, husband as + strep today.  - Upper Respiratory Culture sent Rest, hydrate.  + flonase, mucinex (DM if cough), nettie pot or nasal saline.  Continue claritin.  Can use advil also.  Amoxicillin prescribed every 8 hours for 10 days, IF we call with positive cultures or symptoms worsen over the weekend.   If cough present it can last up to 6-8 weeks.  F/U 2 weeks of not improved.      Reviewed expectations re: course of current medical issues.  Discussed self-management of symptoms.  Outlined signs and symptoms indicating need for more acute intervention.  Patient verbalized understanding and all questions were answered.  Patient received an After-Visit Summary.    Orders Placed This Encounter  Procedures  . POCT rapid strep A     Note is dictated utilizing voice recognition software. Although note has been proof read prior to signing, occasional typographical errors still can be missed. If any questions arise, please do not hesitate to call for verification.   electronically signed by:  Howard Pouch, DO  [place]

## 2018-01-25 NOTE — Patient Instructions (Signed)
Rest, hydrate.  + flonase, mucinex (DM if cough), nettie pot or nasal saline.  Continue claritin.  Can use advil also.  Amoxicillin prescribed every 8 hours for 10 days, IF we call with positive cultures or symptoms worsen over the weekend.   If cough present it can last up to 6-8 weeks.  F/U 2 weeks of not improved.     Pharyngitis Pharyngitis is a sore throat (pharynx). There is redness, pain, and swelling of your throat. Follow these instructions at home:  Drink enough fluids to keep your pee (urine) clear or pale yellow.  Only take medicine as told by your doctor. ? You may get sick again if you do not take medicine as told. Finish your medicines, even if you start to feel better. ? Do not take aspirin.  Rest.  Rinse your mouth (gargle) with salt water ( tsp of salt per 1 qt of water) every 1-2 hours. This will help the pain.  If you are not at risk for choking, you can suck on hard candy or sore throat lozenges. Contact a doctor if:  You have large, tender lumps on your neck.  You have a rash.  You cough up green, yellow-brown, or bloody spit. Get help right away if:  You have a stiff neck.  You drool or cannot swallow liquids.  You throw up (vomit) or are not able to keep medicine or liquids down.  You have very bad pain that does not go away with medicine.  You have problems breathing (not from a stuffy nose). This information is not intended to replace advice given to you by your health care provider. Make sure you discuss any questions you have with your health care provider. Document Released: 02/07/2008 Document Revised: 01/27/2016 Document Reviewed: 04/28/2013 Elsevier Interactive Patient Education  2017 [employer].

## 2018-01-27 LAB — CULTURE, UPPER RESPIRATORY
MICRO NUMBER: 90633721
SPECIMEN QUALITY:: ADEQUATE

## 2018-01-31 ENCOUNTER — Ambulatory Visit: Payer: 59 | Admitting: Physical Therapy

## 2018-01-31 ENCOUNTER — Encounter: Payer: Self-pay | Admitting: Physical Therapy

## 2018-01-31 DIAGNOSIS — R293 Abnormal posture: Secondary | ICD-10-CM

## 2018-01-31 DIAGNOSIS — M6281 Muscle weakness (generalized): Secondary | ICD-10-CM

## 2018-01-31 DIAGNOSIS — R252 Cramp and spasm: Secondary | ICD-10-CM

## 2018-01-31 DIAGNOSIS — G8929 Other chronic pain: Secondary | ICD-10-CM

## 2018-01-31 DIAGNOSIS — M5442 Lumbago with sciatica, left side: Secondary | ICD-10-CM

## 2018-01-31 NOTE — Patient Instructions (Addendum)
Access Code: [email or web address]  URL: [email or web address]  Date: 01/31/2018  Prepared by: Earlie Counts   Exercises  Prone Press Up on Elbows - 1 reps - 1 sets - 1 min hold - 2x daily - 7x weekly  Seated Thoracic Extension and Rotation with Reach - 10 reps - 1 sets - 2x daily - 7x weekly  Seated Thoracic Extenion Arms Overhead - 5 reps - 1 sets - 5 hold - 2x daily - 7x weekly  Doorway Pec Stretch at 90 Degrees Abduction - 2 reps - 1 sets - 30 hold - 1x daily - 7x weekly  Prone Quadriceps Stretch with Strap - 2 reps - 1 sets - 30 hold - 1x daily - 7x weekly  Squat with Chair Touch - 5 reps - 1 sets - 1x daily - 7x weekly  Modified Thomas Stretch - 2 reps - 1 sets - 30 hold - 1x daily - 7x weekly  Prone Alternating Arm and Leg Lifts - 10 reps - 1 sets - 1x daily - 7x weekly  [facility] [address], [place], [place] [address] Phone # [phone] Fax [phone] STRETCHING THE PELVIC FLOOR MUSCLES NO DILATOR  Supplies . Vaginal lubricant . Mirror (optional) . Gloves (optional) Positioning . Start in a semi-reclined position with your head propped up. Bend your knees and place your thumb or finger at the vaginal opening. Procedure . Apply a moderate amount of lubricant on the outer skin of your vagina, the labia minora.  Apply additional lubricant to your finger. Marland Kitchen Spread the skin away from the vaginal opening. Place the end of your finger at the opening. . Do a maximum contraction of the pelvic floor muscles. Tighten the vagina and the anus maximally and relax. . When you know they are relaxed, gently and slowly insert your finger into your vagina, directing your finger slightly downward, for 2-3 inches of insertion. . Relax and stretch the 6 o'clock position . Hold each stretch for _2 min__ and repeat __1_ time with rest breaks of _1__ seconds between each stretch. . Repeat the stretching in the 4 o'clock and 8 o'clock positions. . Total time should be  _6__ minutes, _1__ x per day.  Note the amount of theme your were able to achieve and your tolerance to your finger in your vagina. . Once you have accomplished the techniques you may try them in standing with one foot resting on the tub, or in other positions.  This is a good stretch to do in the shower if you don't need to use lubricant.

## 2018-01-31 NOTE — Therapy (Signed)
[facility] [address] [place], [place], [address] Phone: [phone]   Fax:  [phone] Physical Therapy Progress Note  Dates of Reporting Period: 09/21/2017 to 01/31/2018    Physical Therapy Treatment  Patient Details  Name: Jamie Miller MRN: [id number] Date of Birth: 1971-03-01 Referring Provider: Dr. Howard Pouch   Encounter Date: 01/31/2018  PT End of Session - 01/31/18 0845    Visit Number  20    Date for PT Re-Evaluation  04/23/18    Authorization Type  UHC    PT Start Time  0845    PT Stop Time  0925    PT Time Calculation (min)  40 min    Activity Tolerance  Patient tolerated treatment well;No increased pain    Behavior During Therapy  WFL for tasks assessed/performed       Past Medical History:  Diagnosis Date  . Allergy   . Anxiety   . Asthma   . Bladder spasms 10/2017   doing pelvic floor PT  . Chicken pox   . OAB (overactive bladder)   . Thyroid disease    was on low dose synthroid at one time, but removed and levels have been "normal" since and followed thorugh GYN  . Vitamin D deficiency     Past Surgical History:  Procedure Laterality Date  . APPENDECTOMY  1990  . BREAST BIOPSY  2000   biopsy  . CERVICAL CERCLAGE  2009  . HYMENECTOMY  2005  . WISDOM TOOTH EXTRACTION      There were no vitals filed for this visit.  Subjective Assessment - 01/31/18 0846    Subjective  My back is better. My back is 80% better.  When I turn a certain way I feel the pain. I went 3 hours betweeen urination.     Pertinent History  PT for urinary incontinence 2009.      Diagnostic tests  x-rays of spine degenerative changes    Patient Stated Goals  not worry about urinary leakage, decreased pain with intercourse    Currently in Pain?  Yes    Pain Score  2     Pain Location  Back    Pain Orientation  Lower    Pain Descriptors / Indicators  Sore;Spasm    Pain Type  Acute pain    Pain Onset  More  than a month ago    Pain Frequency  Intermittent    Aggravating Factors   twisting and bending increases back pain    Pain Relieving Factors  sleeping    Multiple Pain Sites  No         [facility] PT Assessment - 01/31/18 0001      Assessment   Medical Diagnosis  N393.3 Stress incontinence of urine/lumbar radiculopathy    Referring Provider  Dr. Howard Pouch    Onset Date/Surgical Date  08/28/17    Hand Dominance  Left    Next MD Visit  PRN    Prior Therapy  for pelvic pain      Precautions   Precautions  None      Restrictions   Weight Bearing Restrictions  No      Balance Screen   Has the patient fallen in the past 6 months  No    Has the patient had a decrease in activity level because of a fear of falling?   No    Is the patient reluctant to leave their home because of a  fear of falling?   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      Cognition   Overall Cognitive Status  Within Functional Limits for tasks assessed      Posture/Postural Control   Posture/Postural Control  Postural limitations    Postural Limitations  Decreased lumbar lordosis;Forward head;Rounded Shoulders;Increased thoracic kyphosis    Posture Comments  Lt ASIS higher in supine, medial maleoli      AROM   Lumbar Extension  decreased by 25%    Lumbar - Right Side Bend  decreased by 25%      PROM   Right Hip External Rotation   60    Left Hip External Rotation   60      Strength   Right Hip Extension  4-/5    Right Hip ABduction  4+/5    Left Hip Extension  4/5    Left Hip ABduction  4+/5      Palpation   SI assessment   pelvis in correct alignment                Pelvic Floor Special Questions - 01/31/18 0001    Pelvic Floor Internal Exam  Patient cofirms identification and approves PT to assess muslce integrity and  treatment    Exam Type  Vaginal    Palpation  band on the right, not tender just uncomfortable    Strength  Flicker        [facility] Adult PT Treatment/Exercise - 01/31/18 0001      Lumbar Exercises: Stretches   Quad Stretch  Right;Left;2 reps;30 seconds      Lumbar Exercises: Aerobic   Nustep  seat #8, arm #10 level 3 for 5 min      Manual Therapy   Manual Therapy  Myofascial release;Internal Pelvic Floor    Myofascial Release  external pelvic floor around the perineum    Internal Pelvic Floor  along the introitus, along the puborectalis, pubovaginalis, obturator internist and perineal body             PT Education - 01/31/18 0908    Education provided  Yes    Education Details  Access Code: [email or web address] ; how to stretch the perineum    Person(s) Educated  Patient    Methods  Explanation;Demonstration;Verbal cues;Handout    Comprehension  Returned demonstration       PT Short Term Goals - 11/13/17 1430      PT SHORT TERM GOAL #4   Title  improved left hip ER to >/= 60 degrees passively    Time  4    Period  Weeks    Status  Achieved        PT Long Term Goals - 01/31/18 2297      PT LONG TERM GOAL #1   Title  I with advanced HEP for core stability (02/05/18)     Baseline  still learning    Time  12    Period  Weeks    Status  On-going      PT LONG TERM GOAL #2   Title  report =/> 75% reduction of Lt LE symptoms with walking ( 02/05/18)     Time  12    Period  Weeks    Status  Achieved      PT LONG TERM GOAL #4   Title  maintain level pelvic, not Lt ilium rotation =/> 1 wk (11/02/17)     Time  6    Period  Weeks    Status  Achieved      PT LONG TERM GOAL #5   Title  demo bilat hip strength =/> 5-/5 to allow her to start a walking program (02/05/18)     Time  12    Period  Weeks    Status  Partially Met      PT LONG TERM GOAL #7   Title  urinary leakage decreased >/= 75% due to improve circular contraction of pelvic floor and increased strength    Baseline   none    Time  8    Period  Weeks    Status  Achieved      PT LONG TERM GOAL #8   Title  understand how to use moisturizers and lubricants for the vaginal area for good vaginal health    Time  8    Period  Weeks    Status  Achieved            Plan - 01/31/18 8335    Clinical Impression Statement  Patient has no urinary leakage.  Patient was able to wait 3 hours to urinate due to taking her mind off of it.  Patient has improved trunk ROM so extension and righ tsidebending decreased by 25%.  Patient still needs verbal cues to sit upright.  Bilateral hip extension and abduction is decreased.  Pelvic floor strength is 1/5 and has trouble with sensation of the pelvic floor.  Patient did not have pain with internal soft tissue work just discomfort but she has trougl recognizing pain and discomfort.  Patient has a band of tissue on the right side of the introitus and will benefit from working with a dilator.  Patient will benefit from skilled therapy to release tissue and reduce pain to improve lumbar and pelvic coordination.     Rehab Potential  Excellent    Clinical Impairments Affecting Rehab Potential  None    PT Frequency  1x / week    PT Duration  12 weeks    PT Treatment/Interventions  Biofeedback;Cryotherapy;Electrical Stimulation;Moist Heat;Ultrasound;Therapeutic activities;Therapeutic exercise;Patient/family education;Neuromuscular re-education;Manual techniques;Scar mobilization;Passive range of motion;Dry needling    PT Next Visit Plan  soft tissue work externally and possible internally, core strength, abdominal soft tissue work; pelvic circles and tilts, expand the chest    PT Home Exercise Plan  Access Code: [email or web address]     Recommended Other Services  Sent renewal on 01/31/2018    Consulted and Agree with Plan of Care  Patient       Patient will benefit from skilled therapeutic intervention in order to improve the following deficits and impairments:  Pain, Increased fascial  restricitons, Decreased coordination, Decreased mobility, Increased muscle spasms, Decreased scar mobility, Decreased endurance, Decreased range of motion, Decreased strength, Decreased activity tolerance  Visit Diagnosis: Abnormal posture - Plan: PT plan of care cert/re-cert  Muscle weakness (generalized) - Plan: PT plan of care cert/re-cert  Cramp and spasm - Plan: PT plan of care cert/re-cert  Chronic left-sided low back pain with left-sided sciatica - Plan: PT plan of care cert/re-cert     Problem List Patient Active Problem List   Diagnosis Date Noted  . Spondylosis of lumbosacral region without myelopathy or radiculopathy 09/07/2017  . Generalized abdominal pain 10/11/2016  . Dyspareunia  in female 08/25/2015  . Irregular menses 04/16/2013  . Vitamin D deficiency 04/16/2013  . H/O: hypothyroidism 11/14/2012    Earlie Counts, PT 01/31/18 9:38 AM   [facility] [address] [place], [place], [address] Phone: [phone]   Fax:  [phone]  Name: GORDANA KEWLEY MRN: [id number] Date of Birth: 1971/03/22

## 2018-02-01 ENCOUNTER — Encounter: Payer: Self-pay | Admitting: Family Medicine

## 2018-02-05 ENCOUNTER — Ambulatory Visit: Payer: 59 | Attending: Family Medicine | Admitting: Physical Therapy

## 2018-02-05 ENCOUNTER — Encounter: Payer: Self-pay | Admitting: Physical Therapy

## 2018-02-05 DIAGNOSIS — M62838 Other muscle spasm: Secondary | ICD-10-CM

## 2018-02-05 DIAGNOSIS — R293 Abnormal posture: Secondary | ICD-10-CM

## 2018-02-05 DIAGNOSIS — R252 Cramp and spasm: Secondary | ICD-10-CM | POA: Diagnosis present

## 2018-02-05 DIAGNOSIS — G8929 Other chronic pain: Secondary | ICD-10-CM | POA: Diagnosis present

## 2018-02-05 DIAGNOSIS — M6281 Muscle weakness (generalized): Secondary | ICD-10-CM

## 2018-02-05 DIAGNOSIS — M5442 Lumbago with sciatica, left side: Secondary | ICD-10-CM | POA: Insufficient documentation

## 2018-02-05 NOTE — Patient Instructions (Addendum)
Think Taller    Stand 2-4 inches taller. Imagine a string, golden thread, rope, or steel cable pulling up at the crown of the head. Avoid tilting head back - keep chin parallel to floor. Do not shrug shoulders. Practice frequently during the day.   Copyright  VHI. All rights reserved.  Waist Lengthener    Feel top of pelvis (hipbones) all the way around and bottom of ribs all the way around. Lengthen space between them by lifting ribs up and away from pelvis. Repeat frequently during the day.  Copyright  VHI. All rights reserved.  Waist Lengthener    Feel top of pelvis (hipbones) all the way around and bottom of ribs all the way around. Lengthen space between them by lifting ribs up and away from pelvis. Repeat frequently during the day.  Copyright  VHI. All rights reserved.    Sitting  Rock your hips side to side till equal weight on both sitting bones Then rock your hips forward and backward till least tension on the sitting bones Keep shoulder relaxed Lift up chest Lengthen waist  Posture bra  [facility] [address] [place], [place] [address] Phone # [phone] Fax [phone]

## 2018-02-05 NOTE — Therapy (Signed)
[facility] [address] [place], [place], [address] Phone: [phone]   Fax:  [phone]  Physical Therapy Treatment  Patient Details  Name: Jamie Miller MRN: [id number] Date of Birth: [birth year] Referring Provider: Dr. Howard Pouch   Encounter Date: 02/05/2018  PT End of Session - 02/05/18 1016    Visit Number  21    Date for PT Re-Evaluation  04/23/18    Authorization Type  UHC    PT Start Time  8786    PT Stop Time  1055    PT Time Calculation (min)  40 min    Activity Tolerance  Patient tolerated treatment well;No increased pain    Behavior During Therapy  WFL for tasks assessed/performed       Past Medical History:  Diagnosis Date  . Allergy   . Anxiety   . Asthma   . Bladder spasms 10/2017   doing pelvic floor PT  . Chicken pox   . OAB (overactive bladder)   . Thyroid disease    was on low dose synthroid at one time, but removed and levels have been "normal" since and followed thorugh GYN  . Vitamin D deficiency     Past Surgical History:  Procedure Laterality Date  . APPENDECTOMY  1990  . BREAST BIOPSY  2000   biopsy  . CERVICAL CERCLAGE  2009  . HYMENECTOMY  2005  . WISDOM TOOTH EXTRACTION      There were no vitals filed for this visit.  Subjective Assessment - 02/05/18 1014    Subjective  I have my cycle. I want to sit and stand upright.  I am not able to lay on my stomach exercise.  Right foot is sleepy and more than normal and started today. Numbness on the lateral left thigh is decreased.     Pertinent History  PT for urinary incontinence 2009.      Diagnostic tests  x-rays of spine degenerative changes    Patient Stated Goals  not worry about urinary leakage, decreased pain with intercourse    Currently in Pain?  Yes    Pain Score  3     Pain Location  Back    Pain Orientation  Right;Lower    Pain Descriptors / Indicators  Spasm;Sore    Pain Type  Acute pain    Pain Onset  More  than a month ago    Aggravating Factors   twisting and bending incresaes back pain    Pain Relieving Factors  sleeping    Multiple Pain Sites  No         [facility] PT Assessment - 02/05/18 0001      Palpation   SI assessment   sacrum rotated left                   [facility] Adult PT Treatment/Exercise - 02/05/18 0001      Therapeutic Activites    Therapeutic Activities  Other Therapeutic Activities    ADL's  sit to stand with erect spine    Other Therapeutic Activities  sitting posture with relaxed shoulders, increased lordosis, chest up and lengthen waist      Lumbar Exercises: Seated   Sit to Stand  Limitations;20 reps    Sit to Stand Limitations  hands in hip crease to improve hip flexion and erect spine      Manual Therapy   Manual Therapy  Taping;Joint mobilization    Joint Mobilization  correct  the sacrum to rotate to the right; P-a and rotational mobilizaiton to T8-L5 grade 3    McConnell  criss cross for the thoracic and up and donw on lumbar to promote extension and posture awareness             PT Education - 02/05/18 1056    Education provided  Yes    Education Details  posture awareness in sitting and standing    Person(s) Educated  Patient    Methods  Explanation;Demonstration;Verbal cues;Handout    Comprehension  Returned demonstration;Verbalized understanding       PT Short Term Goals - 11/13/17 1430      PT SHORT TERM GOAL #4   Title  improved left hip ER to >/= 60 degrees passively    Time  4    Period  Weeks    Status  Achieved        PT Long Term Goals - 01/31/18 1423      PT LONG TERM GOAL #1   Title  I with advanced HEP for core stability (02/05/18)     Baseline  still learning    Time  12    Period  Weeks    Status  On-going      PT LONG TERM GOAL #2   Title  report =/> 75% reduction of Lt LE symptoms with walking ( 02/05/18)     Time  12    Period  Weeks    Status  Achieved      PT LONG TERM GOAL #4   Title  maintain  level pelvic, not Lt ilium rotation =/> 1 wk (11/02/17)     Time  6    Period  Weeks    Status  Achieved      PT LONG TERM GOAL #5   Title  demo bilat hip strength =/> 5-/5 to allow her to start a walking program (02/05/18)     Time  12    Period  Weeks    Status  Partially Met      PT LONG TERM GOAL #7   Title  urinary leakage decreased >/= 75% due to improve circular contraction of pelvic floor and increased strength    Baseline  none    Time  8    Period  Weeks    Status  Achieved      PT LONG TERM GOAL #8   Title  understand how to use moisturizers and lubricants for the vaginal area for good vaginal health    Time  8    Period  Weeks    Status  Achieved            Plan - 02/05/18 1013    Clinical Impression Statement  Patient was on her cycle so no pelvic floor soft tisssue work.  Patient was having trouble with her posture and back pain.  Patient was taped in correct posture so her muscles could be reeducated on upright posture.  Patient had no pain after therapy.  Patient does have tingling in right foot but decreases with correct posture.  Patient needs tactile cues to flex her hips with going from sit to stand.  Patient will benefi tfrom skilled therapy to release tissue and redcue pain to improve lumbar and pelvic coordination.     Rehab Potential  Excellent    Clinical Impairments Affecting Rehab Potential  None    PT Frequency  1x / week    PT Duration  12  weeks    PT Treatment/Interventions  Biofeedback;Cryotherapy;Electrical Stimulation;Moist Heat;Ultrasound;Therapeutic activities;Therapeutic exercise;Patient/family education;Neuromuscular re-education;Manual techniques;Scar mobilization;Passive range of motion;Dry needling    PT Next Visit Plan  soft tissue work externally and possible internally, core strength, abdominal soft tissue work; pelvic circles and tilts, expand the chest    Consulted and Agree with Plan of Care  Patient       Patient will benefit from  skilled therapeutic intervention in order to improve the following deficits and impairments:  Pain, Increased fascial restricitons, Decreased coordination, Decreased mobility, Increased muscle spasms, Decreased scar mobility, Decreased endurance, Decreased range of motion, Decreased strength, Decreased activity tolerance  Visit Diagnosis: Abnormal posture  Muscle weakness (generalized)  Cramp and spasm  Chronic left-sided low back pain with left-sided sciatica  Other muscle spasm     Problem List Patient Active Problem List   Diagnosis Date Noted  . Spondylosis of lumbosacral region without myelopathy or radiculopathy 09/07/2017  . Generalized abdominal pain 10/11/2016  . Dyspareunia in female 08/25/2015  . Irregular menses 04/16/2013  . Vitamin D deficiency 04/16/2013  . H/O: hypothyroidism 11/14/2012    Earlie Counts, PT 02/05/18 11:03 AM   [facility] [address], [place], [place], [address] Phone: [phone]   Fax:  [phone]  Name: Jamie Miller MRN: [id number] Date of Birth: [birth year]

## 2018-02-18 ENCOUNTER — Telehealth: Payer: Self-pay | Admitting: Obstetrics & Gynecology

## 2018-02-18 ENCOUNTER — Encounter: Payer: Self-pay | Admitting: Obstetrics & Gynecology

## 2018-02-18 DIAGNOSIS — N926 Irregular menstruation, unspecified: Secondary | ICD-10-CM

## 2018-02-18 NOTE — Telephone Encounter (Signed)
Patient sent the following correspondence through [place]. Routing to triage to assist patient with request.  ----- Message from [place], Generic sent at 02/18/2018 10:22 AM EDT -----    Hello, Dr. Sabra Heck,    My cycle started today-17 days since the last one. You suggested I let you know my cycle so here's the update. Thanks very much.    For 2019  April 15   May 9 (24 day cycle)  June 1 (24 day cycle)  June 17 (17 day cycle)

## 2018-02-18 NOTE — Telephone Encounter (Signed)
Spoke with patient. Describes flow as normal, no heavy bleeding, mild menses cramps. Denies any other GYN symptoms.  Reviewed results, last [facility] 12/07/17 menopausal. PUS recommended if irregular bleeding continues.   PUS scheduled for 02/28/18 at 3pm, consult to follow at 3:30pm with Dr. Sabra Heck. Advised Dr. Sabra Heck will review, I will return call with any additional recommendations.    Order placed for PUS.  Routing to provider for final review. Patient is agreeable to disposition. Will close encounter.  Cc: Lerry Liner, Magdalene Patricia

## 2018-02-22 ENCOUNTER — Encounter: Payer: Self-pay | Admitting: Obstetrics & Gynecology

## 2018-02-22 ENCOUNTER — Telehealth: Payer: Self-pay | Admitting: Obstetrics & Gynecology

## 2018-02-22 NOTE — Telephone Encounter (Signed)
Patient sent the following message through [place]. Routing to triage to assist patient with request.  ----- Message from [place], Generic sent at 02/22/2018 6:26 AM EDT -----    Dear Dr. Sabra Heck,    After speaking with Nurse [name], I realized when my inconsistent bleeding started I had a pelvic ultrasound done with [facility]. Would those results be helpful now? If so, I can send them via MyChart.    Also, I am a little confused as to why I am scheduled for a pelvic ultrasound if sporadic periods are a symptom of premenopause. The last two months have been like a regular period-24 days apart. While this month's is 17 days, which is not my normal. It is so much better than my previous bleeding issues, thankfully.     Thank you very much,  Jamie Miller

## 2018-02-22 NOTE — Telephone Encounter (Signed)
Ultrasound was 15 months ago.  Things can change.  I would prefer her to proceed with this now.

## 2018-02-22 NOTE — Telephone Encounter (Signed)
Spoke with patient, advised as seen below per Dr. Sabra Heck. Patient verbalizes understanding and is agreeable, will keep PUS as scheduled. Encounter closed.

## 2018-02-22 NOTE — Telephone Encounter (Signed)
Per review of Epic, transvaginal & transabdominal PUS completed on 11/28/16. Small 78mm anterior fundal intramural fibroid.    Routing to Dr. Sabra Heck to review and advise.

## 2018-02-25 ENCOUNTER — Encounter: Payer: Self-pay | Admitting: Obstetrics & Gynecology

## 2018-02-25 ENCOUNTER — Telehealth: Payer: Self-pay | Admitting: Obstetrics & Gynecology

## 2018-02-25 NOTE — Telephone Encounter (Signed)
Patient sent the following correspondence through [place]. Routing to triage to assist patient with request.  ----- Message from [place], Generic sent at 02/25/2018 8:45 AM EDT -----    Hi Dr. Sabra Heck,    I hope you had a good weekend. Would it be okay to reschedule my pelvic ultrasound till next month? I could get more data that way, as for 2 previous months it was consistent (24 days). 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 If it is imperative I go this week, I can do that and keep my appointment.     Thank you very much,  Jamie Miller

## 2018-02-25 NOTE — Telephone Encounter (Signed)
Spoke with patient. Patient asking if PUS is "urgent or emergent need"? Advised patient not emergent, is recommended for further evaluation for irregular cycles. PUS rescheduled to 04/02/18 at 2:30pm with consult to follow at 3pm with Dr. Sabra Heck. Patient declined earlier appointments offered. Patient will continue to calendar menses, will call with any concerns or if can schedule at earlier date. Patient verbalizes understanding.    Routing to provider for final review. Patient is agreeable to disposition. Will close encounter.  Cc: Lerry Liner, Magdalene Patricia

## 2018-02-25 NOTE — Telephone Encounter (Signed)
See next phone encounter/ My Chart message. Patient is now scheduled for pelvic ultrasound on 02-28-18.  Encounter closed.

## 2018-02-26 ENCOUNTER — Encounter: Payer: Self-pay | Admitting: Physical Therapy

## 2018-02-26 ENCOUNTER — Ambulatory Visit: Payer: 59 | Admitting: Physical Therapy

## 2018-02-26 DIAGNOSIS — M6281 Muscle weakness (generalized): Secondary | ICD-10-CM

## 2018-02-26 DIAGNOSIS — R293 Abnormal posture: Secondary | ICD-10-CM | POA: Diagnosis not present

## 2018-02-26 DIAGNOSIS — R252 Cramp and spasm: Secondary | ICD-10-CM

## 2018-02-26 NOTE — Patient Instructions (Addendum)
Moisturizers . They are used in the vagina to hydrate the mucous membrane that make up the vaginal canal. . Designed to keep a more normal acid balance (ph) . Once placed in the vagina, it will last between two to three days.  . Use 2-3 times per week at bedtime and last longer than 60 min. . Ingredients to avoid is glycerin and fragrance, can increase chance of infection . Should not be used just before sex due to causing irritation . Most are gels administered either in a tampon-shaped applicator or as a vaginal suppository. They are non-hormonal.   Types of Moisturizers . Samul Dada- drug store . Vitamin E vaginal suppositories- Whole foods, Amazon . Moist Again . Coconut oil- can break down condoms . Michail Jewels . Yes moisturizer- amazon . NeuEve Silk , NeuEve Silver for menopausal or over 65 (if have severe vaginal atrophy or cancer treatments use NeuEve Silk for  1 month than move to The Pepsi)- [employer], MapleFlower.dk . Olive and Bee intimate cream- www.oliveandbee.com.au  Creams to use externally on the Vulva area  Albertson's (good for for cancer patients that had radiation to the area)- [place] or [employer].FlyingBasics.com.br  V-magic cream - amazon  Julva-amazon  Vital "V Wild Yam salve ( help moisturize and help with thinning vulvar area, does have Badger   Things to avoid in the vaginal area . Do not use things to irritate the vulvar area . No lotions just specialized creams for the vulva area- Neogyn, V-magic, No soaps; can use Aveeno or Calendula cleanser if needed. Must be gentle . No deodorants . No douches . Good to sleep without underwear to let the vaginal area to air out . No scrubbing: spread the lips to let warm water rinse over labias and pat dry   [facility]  [facility] [address] [place], [place]  [address] Phone # [phone] Fax [phone]

## 2018-02-26 NOTE — Therapy (Signed)
[facility] [address], [place], [place], [address] Phone: [phone]   Fax:  [phone]  Physical Therapy Treatment  Patient Details  Name: Jamie Miller MRN: [id number] Date of Birth: July 25, 1971 Referring Provider: Dr. Howard Pouch   Encounter Date: 02/26/2018  PT End of Session - 02/26/18 0844    Visit Number  22    Number of Visits  24    Date for PT Re-Evaluation  04/23/18    Authorization Type  UHC    PT Start Time  0800    PT Stop Time  8144    PT Time Calculation (min)  44 min    Activity Tolerance  Patient tolerated treatment well;No increased pain    Behavior During Therapy  WFL for tasks assessed/performed       Past Medical History:  Diagnosis Date  . Allergy   . Anxiety   . Asthma   . Bladder spasms 10/2017   doing pelvic floor PT  . Chicken pox   . OAB (overactive bladder)   . Thyroid disease    was on low dose synthroid at one time, but removed and levels have been "normal" since and followed thorugh GYN  . Vitamin D deficiency     Past Surgical History:  Procedure Laterality Date  . APPENDECTOMY  1990  . BREAST BIOPSY  2000   biopsy  . CERVICAL CERCLAGE  2009  . HYMENECTOMY  2005  . WISDOM TOOTH EXTRACTION      There were no vitals filed for this visit.  Subjective Assessment - 02/26/18 0806    Subjective  I had no urinary leakage for 1 month except for yesterday.  My back has not hurt and the exercises help. I feel the coconut oil helps with lubrication for intercourse.  I have less pain with initial penetration with vaginal penetration. Intercourse is painful so I do not like it.     Pertinent History  PT for urinary incontinence 2009.      Diagnostic tests  x-rays of spine degenerative changes    Patient Stated Goals  not worry about urinary leakage, decreased pain with intercourse    Currently in Pain?  Yes    Pain Score  3     Pain Location  Vagina    Pain Orientation  Mid     Pain Descriptors / Indicators  Spasm;Sore    Pain Type  Acute pain    Pain Onset  More than a month ago    Pain Frequency  Intermittent    Aggravating Factors   constant urge to void, intercourse    Pain Relieving Factors  no intercourse    Multiple Pain Sites  No                    Pelvic Floor Special Questions - 02/26/18 0001    Pelvic Floor Internal Exam  Patient cofirms identification and approves PT to assess muslce integrity and treatment    Exam Type  Vaginal    Strength  Flicker        [facility] Adult PT Treatment/Exercise - 02/26/18 0001      Self-Care   Self-Care  Other Self-Care Comments    Other Self-Care Comments   education on dilators and vaginal mositurizers      Manual Therapy   Manual Therapy  Internal Pelvic Floor    Internal Pelvic Floor  urethra sphincter, along the introitus, obturator internist, levator ani with  minimal discomfort++++             PT Education - 02/26/18 0843    Education provided  Yes    Education Details  vaginal moisturizers; vaginal dilators    Person(s) Educated  Patient    Methods  Explanation;Demonstration;Handout    Comprehension  Verbalized understanding;Returned demonstration       PT Short Term Goals - 11/13/17 1430      PT SHORT TERM GOAL #4   Title  improved left hip ER to >/= 60 degrees passively    Time  4    Period  Weeks    Status  Achieved        PT Long Term Goals - 01/31/18 2542      PT LONG TERM GOAL #1   Title  I with advanced HEP for core stability (02/05/18)     Baseline  still learning    Time  12    Period  Weeks    Status  On-going      PT LONG TERM GOAL #2   Title  report =/> 75% reduction of Lt LE symptoms with walking ( 02/05/18)     Time  12    Period  Weeks    Status  Achieved      PT LONG TERM GOAL #4   Title  maintain level pelvic, not Lt ilium rotation =/> 1 wk (11/02/17)     Time  6    Period  Weeks    Status  Achieved      PT LONG TERM GOAL #5   Title  demo  bilat hip strength =/> 5-/5 to allow her to start a walking program (02/05/18)     Time  12    Period  Weeks    Status  Partially Met      PT LONG TERM GOAL #7   Title  urinary leakage decreased >/= 75% due to improve circular contraction of pelvic floor and increased strength    Baseline  none    Time  8    Period  Weeks    Status  Achieved      PT LONG TERM GOAL #8   Title  understand how to use moisturizers and lubricants for the vaginal area for good vaginal health    Time  8    Period  Weeks    Status  Achieved            Plan - 02/26/18 0844    Clinical Impression Statement  Patient was able to tolerate internal soft tissue work without increased pain.  Patient had mysofascial releases during the soft tissue work. Patient tolerates better in sidely . Pelvic floor strength is 1/5 and difficulty with muscle coordination.  Patient had 1 months without urinary leakage.  Patient is having vaginal intercourse now with less pain initial penetration.  Patient will benefi tfrom skilled therapy to release tissue and reduce pain to improve lumbar  and pelvic coordination.     Rehab Potential  Excellent    Clinical Impairments Affecting Rehab Potential  None    PT Frequency  1x / week    PT Duration  12 weeks    PT Treatment/Interventions  Biofeedback;Cryotherapy;Electrical Stimulation;Moist Heat;Ultrasound;Therapeutic activities;Therapeutic exercise;Patient/family education;Neuromuscular re-education;Manual techniques;Scar mobilization;Passive range of motion;Dry needling    PT Next Visit Plan  soft tissue work  internally, core strength, abdominal soft tissue work; pelvic circles and tilts, expand the chest    Recommended Other Services  MD signed the renewal    Consulted and Agree with Plan of Care  Patient       Patient will benefit from skilled therapeutic intervention in order to improve the following deficits and impairments:  Pain, Increased fascial restricitons, Decreased  coordination, Decreased mobility, Increased muscle spasms, Decreased scar mobility, Decreased endurance, Decreased range of motion, Decreased strength, Decreased activity tolerance  Visit Diagnosis: Muscle weakness (generalized)  Cramp and spasm     Problem List Patient Active Problem List   Diagnosis Date Noted  . Spondylosis of lumbosacral region without myelopathy or radiculopathy 09/07/2017  . Generalized abdominal pain 10/11/2016  . Dyspareunia in female 08/25/2015  . Irregular menses 04/16/2013  . Vitamin D deficiency 04/16/2013  . H/O: hypothyroidism 11/14/2012    Earlie Counts, PT 02/26/18 8:48 AM   [facility] [address] [place], [place], [address] Phone: [phone]   Fax:  [phone]  Name: Jamie Miller MRN: [id number] Date of Birth: 09-29-1970

## 2018-02-28 ENCOUNTER — Other Ambulatory Visit: Payer: Self-pay | Admitting: Obstetrics & Gynecology

## 2018-02-28 ENCOUNTER — Other Ambulatory Visit: Payer: Self-pay

## 2018-03-12 ENCOUNTER — Encounter: Payer: Self-pay | Admitting: Physical Therapy

## 2018-03-12 ENCOUNTER — Ambulatory Visit: Payer: 59 | Attending: Family Medicine | Admitting: Physical Therapy

## 2018-03-12 DIAGNOSIS — R293 Abnormal posture: Secondary | ICD-10-CM | POA: Insufficient documentation

## 2018-03-12 DIAGNOSIS — M62838 Other muscle spasm: Secondary | ICD-10-CM | POA: Diagnosis present

## 2018-03-12 DIAGNOSIS — R252 Cramp and spasm: Secondary | ICD-10-CM

## 2018-03-12 DIAGNOSIS — G8929 Other chronic pain: Secondary | ICD-10-CM | POA: Diagnosis present

## 2018-03-12 DIAGNOSIS — M6281 Muscle weakness (generalized): Secondary | ICD-10-CM | POA: Diagnosis not present

## 2018-03-12 DIAGNOSIS — M5442 Lumbago with sciatica, left side: Secondary | ICD-10-CM | POA: Insufficient documentation

## 2018-03-12 NOTE — Patient Instructions (Addendum)
Access Code: [email or web address]  URL: [email or web address]  Date: 03/12/2018  Prepared by: Earlie Counts   Exercises  Prone Press Up on Elbows - 1 reps - 1 sets - 1 min hold - 2x daily - 7x weekly  Seated Thoracic Extension and Rotation with Reach - 10 reps - 1 sets - 2x daily - 7x weekly  Seated Thoracic Extenion Arms Overhead - 5 reps - 1 sets - 5 hold - 2x daily - 7x weekly  Doorway Pec Stretch at 90 Degrees Abduction - 2 reps - 1 sets - 30 hold - 1x daily - 7x weekly  Prone Quadriceps Stretch with Strap - 2 reps - 1 sets - 30 hold - 1x daily - 7x weekly  Squat with Chair Touch - 5 reps - 1 sets - 1x daily - 7x weekly  Modified Thomas Stretch - 2 reps - 1 sets - 30 hold - 1x daily - 7x weekly  Prone Shoulder Flexion - 10 reps - 1 sets - 1x daily - 7x weekly  Plank with Hip Extension on Counter - 10 reps - 1 sets - 1x daily - 7x weekly  Bent Knee Fallouts with Alternating Legs - 10 reps - 1 sets - 1x daily - 7x weekly  Supine March with Alternating Leg Lifts - 10 reps - 1 sets - 1x daily - 7x weekly  [facility] [address], [place], [place] [address] Phone # [phone] Fax [phone]

## 2018-03-12 NOTE — Therapy (Signed)
[facility] [address], [place], [place], [address] Phone: [phone]   Fax:  [phone]  Physical Therapy Treatment  Patient Details  Name: Jamie Miller MRN: [id number] Date of Birth: [birth year] Referring Provider: Dr. Howard Pouch   Encounter Date: 03/12/2018  PT End of Session - 03/12/18 0940    Visit Number  23    Date for PT Re-Evaluation  04/23/18    Authorization Type  UHC    PT Start Time  0930    PT Stop Time  1010    PT Time Calculation (min)  40 min    Activity Tolerance  Patient tolerated treatment well;No increased pain    Behavior During Therapy  WFL for tasks assessed/performed       Past Medical History:  Diagnosis Date  . Allergy   . Anxiety   . Asthma   . Bladder spasms 10/2017   doing pelvic floor PT  . Chicken pox   . OAB (overactive bladder)   . Thyroid disease    was on low dose synthroid at one time, but removed and levels have been "normal" since and followed thorugh GYN  . Vitamin D deficiency     Past Surgical History:  Procedure Laterality Date  . APPENDECTOMY  1990  . BREAST BIOPSY  2000   biopsy  . CERVICAL CERCLAGE  2009  . HYMENECTOMY  2005  . WISDOM TOOTH EXTRACTION      There were no vitals filed for this visit.  Subjective Assessment - 03/12/18 0934    Subjective  When I bend incorrectly I feel the back pain the next day. Intercourse is the same internally and intial penetration is better. Intial penetration is 30% better.     Pertinent History  PT for urinary incontinence 2009.      Diagnostic tests  x-rays of spine degenerative changes    Patient Stated Goals  not worry about urinary leakage, decreased pain with intercourse    Currently in Pain?  Yes    Pain Score  10-Worst pain ever    Pain Location  Vagina    Pain Orientation  Mid    Pain Descriptors / Indicators  Spasm;Stabbing    Pain Type  Acute pain;Chronic pain    Pain Onset  More than a month ago     Pain Frequency  Intermittent    Aggravating Factors   constant urge to void, intercourse    Pain Relieving Factors  no intercourse    Multiple Pain Sites  No                       [facility] Adult PT Treatment/Exercise - 03/12/18 0001      Manual Therapy   Manual Therapy  Soft tissue mobilization    Soft tissue mobilization  right gluteal and piriformis in left sidely             PT Education - 03/12/18 0957    Education provided  Yes    Education Details  abdominal strength with knee out and marching; Access Code: [email or web address]    Person(s) Educated  Patient    Methods  Explanation;Handout    Comprehension  Verbalized understanding;Returned demonstration       PT Short Term Goals - 11/13/17 1430      PT SHORT TERM GOAL #4   Title  improved left hip ER to >/= 60 degrees passively    Time  4  Period  Weeks    Status  Achieved        PT Long Term Goals - 03/12/18 1610      PT LONG TERM GOAL #1   Title  I with advanced HEP for core stability (02/05/18)     Baseline  still learning    Time  12    Period  Weeks    Status  On-going      PT LONG TERM GOAL #2   Title  report =/> 75% reduction of Lt LE symptoms with walking ( 02/05/18)     Baseline  50-60%    Time  12    Period  Weeks    Status  Achieved      PT LONG TERM GOAL #3   Title  improve FOTO =/< 29% limited ( 02/05/18)     Time  12    Period  Weeks    Status  On-going      PT LONG TERM GOAL #4   Title  maintain level pelvic, not Lt ilium rotation =/> 1 wk (11/02/17)     Time  6    Period  Weeks    Status  Achieved      PT LONG TERM GOAL #6   Title  pain with intercourse decreased >/= 50% due to ability to relax the pelvic floor and understand vaginal health    Baseline  initial penetration is 305 better but going deeper is no change    Time  8    Period  Weeks    Status  On-going      PT LONG TERM GOAL #7   Title  urinary leakage decreased >/= 75% due to improve circular contraction of  pelvic floor and increased strength    Time  8    Period  Weeks    Status  Achieved      PT LONG TERM GOAL #8   Title  understand how to use moisturizers and lubricants for the vaginal area for good vaginal health    Time  8    Period  Weeks    Status  Achieved            Plan - 03/12/18 0940    Clinical Impression Statement  Patient reports initial penetration was 30% better.  No urinary incontinence. She is able to avoid the urge to urinate.  Patient had trigger points in the gluteal today.  Patient needed tactile cues and adjustment to the prone lift extremity.  Patient will benefit from skilled therapy to improve core strength and reduce pain with penile penetration.     Rehab Potential  Excellent    Clinical Impairments Affecting Rehab Potential  None    PT Frequency  1x / week    PT Duration  12 weeks    PT Treatment/Interventions  Biofeedback;Cryotherapy;Electrical Stimulation;Moist Heat;Ultrasound;Therapeutic activities;Therapeutic exercise;Patient/family education;Neuromuscular re-education;Manual techniques;Scar mobilization;Passive range of motion;Dry needling    PT Next Visit Plan  soft tissue work  internally, core strength, abdominal soft tissue work; pelvic circles and tilts, expand the chest    PT Home Exercise Plan  Access Code: [email or web address]     Consulted and Agree with Plan of Care  Patient       Patient will benefit from skilled therapeutic intervention in order to improve the following deficits and impairments:  Pain, Increased fascial restricitons, Decreased coordination, Decreased mobility, Increased muscle spasms, Decreased scar mobility, Decreased endurance, Decreased range of motion, Decreased strength, Decreased activity tolerance  Visit Diagnosis: Muscle weakness (generalized)  Cramp and spasm  Abnormal posture  Chronic left-sided low back pain with left-sided sciatica  Other muscle spasm     Problem List Patient Active Problem List   Diagnosis  Date Noted  . Spondylosis of lumbosacral region without myelopathy or radiculopathy 09/07/2017  . Generalized abdominal pain 10/11/2016  . Dyspareunia in female 08/25/2015  . Irregular menses 04/16/2013  . Vitamin D deficiency 04/16/2013  . H/O: hypothyroidism 11/14/2012    Earlie Counts, PT 03/12/18 10:15 AM   [facility] [address] [place], [place], [address] Phone: [phone]   Fax:  [phone]  Name: Jamie Miller MRN: [id number] Date of Birth: 02/12/1971

## 2018-03-20 ENCOUNTER — Telehealth: Payer: Self-pay | Admitting: Emergency Medicine

## 2018-03-20 NOTE — Telephone Encounter (Signed)
Patient currently scheduled for PUS 04/02/18 with follow up with Dr. Sabra Heck.  Sonographer unavailable. Will need to reschedule.   Message left to return call to [place] at [phone] to reschedule ultrasound at her convenience.

## 2018-03-22 NOTE — Telephone Encounter (Signed)
Message left to return call to Alysiah Suppa at [phone].    

## 2018-03-22 NOTE — Telephone Encounter (Signed)
Patient returned call to reschedule ultrasound appointment. Appointment rescheduled to 04/04/18 at 1:00pm.   Routing to provider for final review. Patient agreeable to disposition. Will close encounter.

## 2018-03-27 ENCOUNTER — Encounter: Payer: Self-pay | Admitting: Obstetrics & Gynecology

## 2018-04-01 ENCOUNTER — Encounter: Payer: Self-pay | Admitting: Physical Therapy

## 2018-04-01 ENCOUNTER — Ambulatory Visit: Payer: 59 | Admitting: Physical Therapy

## 2018-04-01 DIAGNOSIS — M6281 Muscle weakness (generalized): Secondary | ICD-10-CM | POA: Diagnosis not present

## 2018-04-01 DIAGNOSIS — R293 Abnormal posture: Secondary | ICD-10-CM

## 2018-04-01 DIAGNOSIS — R252 Cramp and spasm: Secondary | ICD-10-CM

## 2018-04-01 NOTE — Therapy (Signed)
[facility] [address] [place], [place], [address] Phone: [phone]   Fax:  [phone]  Physical Therapy Treatment  Patient Details  Name: TANGELIA SANSON MRN: [id number] Date of Birth: [birth year] Referring Provider: Dr. Howard Pouch   Encounter Date: 04/01/2018  PT End of Session - 04/01/18 1230    Visit Number  24    Date for PT Re-Evaluation  08/23/18    Authorization Type  UHC    PT Start Time  6945    PT Stop Time  1310    PT Time Calculation (min)  40 min    Activity Tolerance  Patient tolerated treatment well;No increased pain    Behavior During Therapy  WFL for tasks assessed/performed       Past Medical History:  Diagnosis Date  . Allergy   . Anxiety   . Asthma   . Bladder spasms 10/2017   doing pelvic floor PT  . Chicken pox   . OAB (overactive bladder)   . Thyroid disease    was on low dose synthroid at one time, but removed and levels have been "normal" since and followed thorugh GYN  . Vitamin D deficiency     Past Surgical History:  Procedure Laterality Date  . APPENDECTOMY  1990  . BREAST BIOPSY  2000   biopsy  . CERVICAL CERCLAGE  2009  . HYMENECTOMY  2005  . WISDOM TOOTH EXTRACTION      There were no vitals filed for this visit.  Subjective Assessment - 04/01/18 1233    Subjective  Pelvis is the same.  I now have the back pain while I sleep. I am having a pelvic floor ultrasound on this Thursday.     Pertinent History  PT for urinary incontinence 2009.      Diagnostic tests  x-rays of spine degenerative changes    Patient Stated Goals  not worry about urinary leakage, decreased pain with intercourse    Currently in Pain?  Yes    Pain Score  3     Pain Location  Vagina    Pain Orientation  Mid    Pain Descriptors / Indicators  Spasm    Pain Type  Chronic pain    Pain Onset  More than a month ago    Pain Frequency  Constant    Aggravating Factors   more during the day.  Constant urge to void during the night, intercourse    Pain Relieving Factors  no intercourse    Multiple Pain Sites  Yes    Pain Score  4    Pain Location  Back    Pain Orientation  Right;Lower    Pain Descriptors / Indicators  Aching    Pain Type  Acute pain    Pain Onset  Yesterday    Pain Frequency  Intermittent    Aggravating Factors   sleeping,          [facility] PT Assessment - 04/01/18 0001      Assessment   Medical Diagnosis  N393.3 Stress incontinence of urine/lumbar radiculopathy    Referring Provider  Dr. 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      Cognition   Overall Cognitive Status  Within Functional Limits for tasks assessed      Posture/Postural Control   Posture/Postural Control  Postural limitations    Postural Limitations  Decreased lumbar lordosis;Forward head;Rounded Shoulders;Increased thoracic kyphosis    Posture Comments  Lt ASIS higher in supine, medial maleoli      AROM   Lumbar Extension  decreased by 25%    Lumbar - Right Side Bend  decreased by 25%      PROM   Right Hip External Rotation   60    Left Hip External Rotation   60      Strength   Right Hip Extension  4-/5    Right Hip ABduction  4+/5    Left Hip Extension  4/5    Left Hip ABduction  4+/5      Palpation   SI assessment   pelvis in correct alignement                Pelvic Floor Special Questions - 04/01/18 0001    Pelvic Floor Internal Exam  Patient cofirms identification and approves PT to assess muslce integrity and treatment    Exam Type  Vaginal    Palpation  band on the right,  not tender just uncomfortable, tendern along the introitus, bulbocavernosus, posterior introitus    Strength  Flicker needs tactile cues to contract lower abdominal        [facility] Adult PT Treatment/Exercise - 04/01/18 0001      Self-Care   Self-Care  Other Self-Care Comments    Other Self-Care Comments   urge to void             PT Education - 04/01/18 1315    Education provided  Yes    Education Details  urge to void    Person(s) Educated  Patient    Methods  Explanation;Demonstration;Verbal cues;Handout    Comprehension  Verbalized understanding;Returned demonstration       PT Short Term Goals - 04/01/18 1238      PT SHORT TERM GOAL #3   Title  ability to contract the lower abdominal muscles so she will be able to contract the pelvic floor muscles in a circular fashion    Time  4    Period  Weeks    Status  Achieved      PT SHORT TERM GOAL #4   Title  improved left hip ER to >/= 60 degrees passively    Time  4    Period  Weeks    Status  Achieved        PT Long Term Goals - 04/01/18 1240      PT LONG TERM GOAL #1   Title  I with advanced HEP for core stability (02/05/18)     Baseline  still learning    Time  12    Period  Weeks    Status  On-going      PT LONG TERM GOAL #2   Title  report =/> 75% reduction of Lt LE symptoms with walking ( 02/05/18)     Time  12    Period  Weeks    Status  Achieved      PT LONG  TERM GOAL #3   Title  improve FOTO =/< 29% limited ( 02/05/18)     Time  12    Period  Weeks    Status  On-going      PT LONG TERM GOAL #4   Title  maintain level pelvic, not Lt ilium rotation =/> 1 wk (11/02/17)     Time  6    Period  Weeks    Status  Achieved      PT LONG TERM GOAL #6   Title  pain with intercourse decreased >/= 50% due to ability to relax the pelvic floor and understand vaginal health    Baseline  initial penetration is 40% better but going deeper is no change    Time  8    Period  Weeks    Status  On-going      PT LONG  TERM GOAL #7   Title  urinary leakage decreased >/= 75% due to improve circular contraction of pelvic floor and increased strength    Baseline  none    Time  8    Period  Weeks    Status  Achieved      PT LONG TERM GOAL  #9   TITLE  urge to void decreased >/= 75% due to ability to relax the pelvic floor muscles.    Time  4    Period  Months    Status  New    Target Date  08/23/18            Plan - 04/01/18 1246    Clinical Impression Statement  Patient is not having urinary leakage. Patient needs tactile cues and verbal cues to contract the pelvic floor muscles. Patient continues to do the urge to urinarte.  Patient continues to need strength of her hips and lower abdominals.  Patient continues to have lower right lumbar pain that is now waking her up at night.  Patient will benefit from skilled therapy to improve core strength and reduce pain with penile penetration.     Rehab Potential  Excellent    Clinical Impairments Affecting Rehab Potential  None    PT Frequency  1x / week    PT Duration  12 weeks    PT Treatment/Interventions  Biofeedback;Cryotherapy;Electrical Stimulation;Moist Heat;Ultrasound;Therapeutic activities;Therapeutic exercise;Patient/family education;Neuromuscular re-education;Manual techniques;Scar mobilization;Passive range of motion;Dry needling    PT Next Visit Plan  soft tissue work  internally, core strength, abdominal soft tissue work; pelvic circles and tilts, expand the chest    PT Home Exercise Plan  Access Code: [email or web address]     Consulted and Agree with Plan of Care  Patient       Patient will benefit from skilled therapeutic intervention in order to improve the following deficits and impairments:  Pain, Increased fascial restricitons, Decreased coordination, Decreased mobility, Increased muscle spasms, Decreased scar mobility, Decreased endurance, Decreased range of motion, Decreased strength, Decreased activity tolerance  Visit Diagnosis: Muscle  weakness (generalized) - Plan: PT plan of care cert/re-cert  Cramp and spasm - Plan: PT plan of care cert/re-cert  Abnormal posture - Plan: PT plan of care cert/re-cert     Problem List Patient Active Problem List   Diagnosis Date Noted  . Spondylosis of lumbosacral region without myelopathy or radiculopathy 09/07/2017  . Generalized abdominal pain 10/11/2016  . Dyspareunia in female 08/25/2015  . Irregular menses 04/16/2013  . Vitamin D deficiency 04/16/2013  . H/O: hypothyroidism 11/14/2012    Earlie Counts, PT 04/01/18 1:25 PM  [facility] [address], [place], [place], [address] Phone: [phone]   Fax:  [phone]  Name: ROVENA HEARLD MRN: [id number] Date of Birth: [birth year]

## 2018-04-01 NOTE — Patient Instructions (Signed)
Relaxation Exercises with the Urge to Void   When you experience an urge to void:  FIRST  Stop and stand very still    Sit down if you can    Don't move    You need to stay very still to maintain control  SECOND Squeeze your pelvic floor muscles 5 times, like a quick flick, to keep from leaking  THIRD Relax  Take a deep breath and then let it out  Try to make the urge go away by using relaxation and visualization techniques  FINALLY When you feel the urge go away somewhat, walk normally to the bathroom.   If the urge gets suddenly stronger on the way, you may stop again and relax to regain control.  [facility] [address], [address] [place], [place] [address] Phone # [phone] Fax [phone]  

## 2018-04-02 ENCOUNTER — Other Ambulatory Visit: Payer: Self-pay

## 2018-04-02 ENCOUNTER — Other Ambulatory Visit: Payer: Self-pay | Admitting: Obstetrics & Gynecology

## 2018-04-04 ENCOUNTER — Encounter: Payer: Self-pay | Admitting: Obstetrics & Gynecology

## 2018-04-04 ENCOUNTER — Ambulatory Visit (INDEPENDENT_AMBULATORY_CARE_PROVIDER_SITE_OTHER): Payer: 59

## 2018-04-04 ENCOUNTER — Ambulatory Visit (INDEPENDENT_AMBULATORY_CARE_PROVIDER_SITE_OTHER): Payer: 59 | Admitting: Obstetrics & Gynecology

## 2018-04-04 VITALS — BP 106/76 | HR 96 | Resp 16 | Ht 64.0 in | Wt 154.0 lb

## 2018-04-04 DIAGNOSIS — N926 Irregular menstruation, unspecified: Secondary | ICD-10-CM

## 2018-04-04 DIAGNOSIS — R3915 Urgency of urination: Secondary | ICD-10-CM

## 2018-04-04 DIAGNOSIS — D251 Intramural leiomyoma of uterus: Secondary | ICD-10-CM | POA: Diagnosis not present

## 2018-04-04 LAB — POCT URINALYSIS DIPSTICK
Bilirubin, UA: NEGATIVE
Blood, UA: NEGATIVE
GLUCOSE UA: NEGATIVE
Ketones, UA: NEGATIVE
Nitrite, UA: NEGATIVE
PH UA: 5 (ref 5.0–8.0)
Protein, UA: NEGATIVE
Urobilinogen, UA: 0.2 E.U./dL

## 2018-04-04 NOTE — Progress Notes (Signed)
47 y.o. G12P0101 Married Caucasian female here for pelvic ultrasound due to irregular bleeding.  Last cycle was 03/27/18.  This lasted just a few days.  But cycles are closer together.  Flow is not heavy.  Doesn't have clots.  Endometrial biopsy 5/18 was negative for abnormal cells.  Last pap with neg HR HPV 12/11/16.  She's been seeing pelvic PT and she thinks this is really helping.  Reports ultrasound was not painful at all today.  Intercourse is improving as well.  She is having some urinary urgency as well.  PT exercises are supposed to help.  She would like to have a urine culture today.  Does not want any medications for OAB at this time.  Patient's last menstrual period was 03/27/2018.  Contraception: vasectomy  Findings:  UTERUS: 5.4 x 4.5 x 3.2cm with 54mm fibroid, increased vascular flow to myometrium EMS: 5.45mm with possible 104mm polyp ADNEXA: Left ovary: 1.5 x 1.0 x 1.1cm       Right ovary: 2.3 x 1.9 x 2.7cm with 2.0 x 1.8 x 2.0cm, simple and avascular CUL DE SAC: no free fluid  Discussion:  Findings reviewed with pt.  She is not interested in any treatment at this time.  As cycles are not heavy and not long, this is ok to monitor.  Assessment:  Intramural fibroid, 10mm Possible 43mm polyp Urinary urgency  Plan:  Urine culture pending Pt is going let me know if flow gets heavier, longer, or more frequent  ~15 minutes spent with patient >50% of time was in face to face discussion of above.

## 2018-04-06 ENCOUNTER — Encounter: Payer: Self-pay | Admitting: Obstetrics & Gynecology

## 2018-04-06 LAB — URINE CULTURE

## 2018-04-24 ENCOUNTER — Encounter: Payer: Self-pay | Admitting: Obstetrics & Gynecology

## 2018-04-25 ENCOUNTER — Encounter: Payer: Self-pay | Admitting: Physical Therapy

## 2018-04-25 ENCOUNTER — Telehealth: Payer: Self-pay | Admitting: Obstetrics & Gynecology

## 2018-04-25 ENCOUNTER — Ambulatory Visit: Payer: 59 | Attending: Family Medicine | Admitting: Physical Therapy

## 2018-04-25 DIAGNOSIS — R293 Abnormal posture: Secondary | ICD-10-CM | POA: Insufficient documentation

## 2018-04-25 DIAGNOSIS — M5442 Lumbago with sciatica, left side: Secondary | ICD-10-CM | POA: Diagnosis present

## 2018-04-25 DIAGNOSIS — R252 Cramp and spasm: Secondary | ICD-10-CM | POA: Insufficient documentation

## 2018-04-25 DIAGNOSIS — M6281 Muscle weakness (generalized): Secondary | ICD-10-CM | POA: Insufficient documentation

## 2018-04-25 DIAGNOSIS — G8929 Other chronic pain: Secondary | ICD-10-CM | POA: Insufficient documentation

## 2018-04-25 NOTE — Telephone Encounter (Signed)
Routing to Dr. Miller to review.  

## 2018-04-25 NOTE — Therapy (Signed)
[facility] [address], [place], [place], [address] Phone: [phone]   Fax:  [phone]  Physical Therapy Treatment  Patient Details  Name: Jamie Miller MRN: [id number] Date of Birth: [birth year] Referring Provider: Dr. Howard Pouch   Encounter Date: 04/25/2018  PT End of Session - 04/25/18 0849    Visit Number  25    Date for PT Re-Evaluation  08/23/18    Authorization Type  UHC    PT Start Time  0845    PT Stop Time  0925    PT Time Calculation (min)  40 min    Activity Tolerance  Patient tolerated treatment well;No increased pain    Behavior During Therapy  WFL for tasks assessed/performed       Past Medical History:  Diagnosis Date  . Allergy   . Anxiety   . Asthma   . Bladder spasms 10/2017   doing pelvic floor PT  . Chicken pox   . OAB (overactive bladder)   . Thyroid disease    was on low dose synthroid at one time, but removed and levels have been "normal" since and followed thorugh GYN  . Vitamin D deficiency     Past Surgical History:  Procedure Laterality Date  . APPENDECTOMY  1990  . BREAST BIOPSY  2000   biopsy  . CERVICAL CERCLAGE  2009  . HYMENECTOMY  2005  . WISDOM TOOTH EXTRACTION      There were no vitals filed for this visit.  Subjective Assessment - 04/25/18 0849    Subjective  No change in intercourse. I have trouble tightening the pelvic floor.  I have not had problems with my back. When I have the urge I am able to wait due to knowing it is not real. Ultrasound showed a fibroid in the lining.     Pertinent History  PT for urinary incontinence 2009.      Diagnostic tests  x-rays of spine degenerative changes    Patient Stated Goals  not worry about urinary leakage, decreased pain with intercourse    Currently in Pain?  Yes    Pain Score  3     Pain Location  Vagina    Pain Orientation  Mid    Pain Descriptors / Indicators  Spasm    Pain Type  Chronic pain    Pain Onset   More than a month ago    Pain Frequency  Constant    Aggravating Factors   more during the day. constant urge to void during the night, intercourse    Pain Relieving Factors  no intercourse    Multiple Pain Sites  No                    Pelvic Floor Special Questions - 04/25/18 0001    Pelvic Floor Internal Exam  Patient cofirms identification and approves PT to assess muslce integrity and treatment    Exam Type  Vaginal    Strength  Flicker   needs tactile cues to contract lower abdominal       [facility] Adult PT Treatment/Exercise - 04/25/18 0001      Manual Therapy   Manual Therapy  Myofascial release    Myofascial Release  myofascial release to the introitus along the hip adductors monitoring for pain or stretch feeling with lots of releases               PT Short Term Goals - 04/01/18  [place] #3   Title  ability to contract the lower abdominal muscles so she will be able to contract the pelvic floor muscles in a circular fashion    Time  4    Period  Weeks    Status  Achieved      PT SHORT TERM GOAL #4   Title  improved left hip ER to >/= 60 degrees passively    Time  4    Period  Weeks    Status  Achieved        PT Long Term Goals - 04/25/18 8841      PT LONG TERM GOAL #1   Title  I with advanced HEP for core stability (02/05/18)     Baseline  still learning    Time  12    Period  Weeks    Status  On-going      PT LONG TERM GOAL #2   Title  report =/> 75% reduction of Lt LE symptoms with walking ( 02/05/18)     Baseline  50-60%    Time  12    Period  Weeks    Status  Achieved      PT LONG TERM GOAL #3   Title  improve FOTO =/< 29% limited ( 02/05/18)     Time  12    Period  Weeks    Status  On-going      PT LONG TERM GOAL #4   Title  maintain level pelvic, not Lt ilium rotation =/> 1 wk (11/02/17)     Time  6    Period  Weeks    Status  Achieved      PT LONG TERM GOAL #5   Title  demo bilat hip strength =/> 5-/5  to allow her to start a walking program (02/05/18)     Time  12    Period  Weeks    Status  On-going      PT LONG TERM GOAL #6   Title  pain with intercourse decreased >/= 50% due to ability to relax the pelvic floor and understand vaginal health    Baseline  initial penetration is 40% better but going deeper is no change    Time  8    Period  Weeks    Status  On-going      PT LONG TERM GOAL #7   Title  urinary leakage decreased >/= 75% due to improve circular contraction of pelvic floor and increased strength    Baseline  none    Time  8    Period  Weeks    Status  Achieved      PT LONG TERM GOAL #8   Title  understand how to use moisturizers and lubricants for the vaginal area for good vaginal health    Time  8    Period  Weeks    Status  Achieved      PT LONG TERM GOAL  #9   TITLE  urge to void decreased >/= 75% due to ability to relax the pelvic floor muscles.    Period  Months    Status  On-going            Plan - 04/25/18 6606    Clinical Impression Statement  Patient has not had back pain since last visit. After manual work she had some cramping in the lower abdominal area.  Patient is able to wait  when she has the urge to urinate.  Patient had increased releases with myofascial release of the pelvic floor. Patient will benefit from skilled therapy to imporve core strength and reduce pain with penile penetration.     Rehab Potential  Excellent    Clinical Impairments Affecting Rehab Potential  None    PT Frequency  1x / week    PT Duration  12 weeks    PT Treatment/Interventions  Biofeedback;Cryotherapy;Electrical Stimulation;Moist Heat;Ultrasound;Therapeutic activities;Therapeutic exercise;Patient/family education;Neuromuscular re-education;Manual techniques;Scar mobilization;Passive range of motion;Dry needling    PT Next Visit Plan  soft tissue work  internally and myofascial release    PT Home Exercise Plan  Access Code: [email or web address]     Consulted and Agree with  Plan of Care  Patient       Patient will benefit from skilled therapeutic intervention in order to improve the following deficits and impairments:  Pain, Increased fascial restricitons, Decreased coordination, Decreased mobility, Increased muscle spasms, Decreased scar mobility, Decreased endurance, Decreased range of motion, Decreased strength, Decreased activity tolerance  Visit Diagnosis: Muscle weakness (generalized)  Cramp and spasm  Abnormal posture  Chronic left-sided low back pain with left-sided sciatica     Problem List Patient Active Problem List   Diagnosis Date Noted  . Spondylosis of lumbosacral region without myelopathy or radiculopathy 09/07/2017  . Generalized abdominal pain 10/11/2016  . Dyspareunia in female 08/25/2015  . Irregular menses 04/16/2013  . Vitamin D deficiency 04/16/2013  . H/O: hypothyroidism 11/14/2012    Earlie Counts, PT 04/25/18 9:41 AM   [facility] [address] [place], [place], [address] Phone: [phone]   Fax:  [phone]  Name: SYANA DEGRAFFENREID MRN: [id number] Date of Birth: October 26, 1970

## 2018-04-25 NOTE — Telephone Encounter (Signed)
Patient sent the following correspondence through [facility]. Routing to triage to assist patient with request.  Dear Dr. Sabra Heck,    I hope you are well. You suggested I let you know when my cycle is less than 17 days. It has. My cycle started just now (August 21). This is 11 days since my last cycle started.     August Cycles  July 24, August 11 and August 21    Thanks very much,  Jamie Miller

## 2018-04-26 NOTE — Telephone Encounter (Signed)
Please see if she would consider restarting micronor since her cycles are getting so much closer together.  She has been on this in the past.  Thanks.

## 2018-04-26 NOTE — Telephone Encounter (Signed)
Spoke with patient, advised per Dr. Sabra Heck. Patient states she will think about this over the weekend and return call if she decides to restart the Micronor. She states she will follow up with the pharmacy to see how Micronor was filled last time, prefers a different generic due to side effects. Patient verbalizes understanding.   Routing to provider for final review. Patient is agreeable to disposition. Will close encounter.

## 2018-05-07 ENCOUNTER — Encounter: Payer: Self-pay | Admitting: Physical Therapy

## 2018-05-07 ENCOUNTER — Encounter: Payer: 59 | Admitting: Physical Therapy

## 2018-05-20 ENCOUNTER — Encounter: Payer: Self-pay | Admitting: Physical Therapy

## 2018-05-20 ENCOUNTER — Ambulatory Visit: Payer: 59 | Attending: Family Medicine | Admitting: Physical Therapy

## 2018-05-20 DIAGNOSIS — R252 Cramp and spasm: Secondary | ICD-10-CM

## 2018-05-20 DIAGNOSIS — R293 Abnormal posture: Secondary | ICD-10-CM | POA: Insufficient documentation

## 2018-05-20 DIAGNOSIS — M62838 Other muscle spasm: Secondary | ICD-10-CM

## 2018-05-20 DIAGNOSIS — M6281 Muscle weakness (generalized): Secondary | ICD-10-CM | POA: Diagnosis not present

## 2018-05-20 DIAGNOSIS — M5442 Lumbago with sciatica, left side: Secondary | ICD-10-CM | POA: Insufficient documentation

## 2018-05-20 DIAGNOSIS — G8929 Other chronic pain: Secondary | ICD-10-CM | POA: Diagnosis present

## 2018-05-20 NOTE — Patient Instructions (Addendum)
Set of 5 Smooth Vaginal Dilators with Handle for Vaginismus, Instructions and Personal Lubrication Included, Fast Free Shipping, Comes with Carry Bag    When you get the device you will bring to [facility] to learn how to use at home.  Cat / Cow Flow    Inhale, press spine toward ceiling like a Halloween cat. Keeping strength in arms and abdominals, exhale to soften spine through neutral and into cow pose. Open chest and arch back. Initiate movement between cat and cow at tailbone, one vertebrae at a time. Repeat __10__ times.  Copyright  VHI. All rights reserved.  BACK: Child's Pose (Sciatica)    Sit in knee-chest position and reach arms forward. Separate knees for comfort. Hold position for __30_ breaths. Repeat __2_ times. Do __1_ times per day. Keep legs apart, make sure arch in upper back.  Copyright  VHI. All rights reserved.   Side Pull: Double Arm    On back, knees bent, feet flat. Arms perpendicular to body, shoulder level, elbows straight but relaxed. Pull arms out to sides, elbows straight. Resistance band comes across collarbones, hands toward floor. Hold momentarily. Slowly return to starting position. Repeat _20__ times. Band color _yellow____    Copyright  VHI. All rights reserved.   Roll tennis ball under your feet daily when sitting down.   Relaxation Exercises with the Urge to Void   When you experience an urge to void:  FIRST  Stop and stand very still    Sit down if you can    Don't move    You need to stay very still to maintain control   Second Relax  Take a deep breath and then let it out  Try to make the urge go away by using relaxation and visualization techniques  FINALLY When you feel the urge go away somewhat, walk normally to the bathroom.    If the urge gets suddenly stronger on the way, you may stop again and relax to regain control. Butterfly, Supine    Lie on back, feet together. Lower knees toward floor. Hold _1 min.  Repeat  __1_ times per session. Do _1__ sessions per day.  Copyright  VHI. All rights reserved.      On Elbows (Prone)    Rise up on elbows as high as possible, keeping hips on floor. Hold __10__ seconds. Repeat ___3_ times per set. Do ___1_ sets per session. Do _1___ sessions per day.  http://orth.exer.us/93   Copyright  VHI. All rights reserved.  [facility] [address], [place], [place] [address] Phone # [phone] Fax [phone]

## 2018-05-20 NOTE — Therapy (Signed)
[facility] [address] [place], [place], [address] Phone: [phone]   Fax:  [phone]  Physical Therapy Treatment  Patient Details  Name: Jamie Miller MRN: [id number] Date of Birth: 05-09-1971 Referring Provider: Dr. Howard Pouch   Encounter Date: 05/20/2018  PT End of Session - 05/20/18 0939    Visit Number  27    Date for PT Re-Evaluation  08/23/18    Authorization Type  UHC    PT Start Time  0930    PT Stop Time  1010    PT Time Calculation (min)  40 min    Activity Tolerance  Patient tolerated treatment well;No increased pain    Behavior During Therapy  WFL for tasks assessed/performed       Past Medical History:  Diagnosis Date  . Allergy   . Anxiety   . Asthma   . Bladder spasms 10/2017   doing pelvic floor PT  . Chicken pox   . OAB (overactive bladder)   . Thyroid disease    was on low dose synthroid at one time, but removed and levels have been "normal" since and followed thorugh GYN  . Vitamin D deficiency     Past Surgical History:  Procedure Laterality Date  . APPENDECTOMY  1990  . BREAST BIOPSY  2000   biopsy  . CERVICAL CERCLAGE  2009  . HYMENECTOMY  2005  . WISDOM TOOTH EXTRACTION      There were no vitals filed for this visit.  Subjective Assessment - 05/20/18 0934    Subjective  No changes.  I have trouble with 2 exercises. No constant urge during the night. Constant urge during the day only when she thinks about it.     Pertinent History  PT for urinary incontinence 2009.      Diagnostic tests  x-rays of spine degenerative changes    Patient Stated Goals  not worry about urinary leakage, decreased pain with intercourse    Currently in Pain?  Yes    Pain Score  9     Pain Location  Vagina    Pain Orientation  Mid    Pain Descriptors / Indicators  Spasm    Pain Type  Chronic pain    Pain Onset  More than a month ago    Pain Frequency  Intermittent    Aggravating Factors    intercourse    Pain Relieving Factors  no intercourse    Multiple Pain Sites  No                       [facility] Adult PT Treatment/Exercise - 05/20/18 0001      Self-Care   Self-Care  Other Self-Care Comments    Other Self-Care Comments   ordering the vaginal dilators, urge to void without the pelvic floor contraction; roll tennis ball under feet to relax the pelvic floor      Therapeutic Activites    Therapeutic Activities  ADL's    Other Therapeutic Activities  posture to sit upright  to decrease pelvic floor tightness      Lumbar Exercises: Stretches   Double Knee to Chest Stretch  2 reps;60 seconds   knee apart   Prone on Elbows Stretch  4 reps;10 seconds   working on lumbar extension   Prone Mid Back Stretch  2 reps;60 seconds   keep knees apart     Lumbar Exercises: Supine   Other Supine Lumbar  Exercises  shoulder horizontal abduction 20 times with yellow theraband with tactile cues to squeeze shoulder blades together      Lumbar Exercises: Quadruped   Madcat/Old Horse  20 reps;Limitations    Madcat/Old Horse Limitations  difficult with lumbar extension and coordination             PT Education - 05/20/18 1012    Education provided  Yes    Education Details  urge to void, butterfly stretch, getting a dilator, cat cow, lumbar stretches, interscapular strength    Person(s) Educated  Patient    Methods  Explanation;Demonstration;Verbal cues;Handout    Comprehension  Returned demonstration;Verbalized understanding       PT Short Term Goals - 04/01/18 1238      PT SHORT TERM GOAL #3   Title  ability to contract the lower abdominal muscles so she will be able to contract the pelvic floor muscles in a circular fashion    Time  4    Period  Weeks    Status  Achieved      PT SHORT TERM GOAL #4   Title  improved left hip ER to >/= 60 degrees passively    Time  4    Period  Weeks    Status  Achieved        PT Long Term Goals - 05/20/18 3299       PT LONG TERM GOAL #1   Title  I with advanced HEP for core stability (02/05/18)     Baseline  still learning    Time  12    Period  Weeks    Status  On-going      PT LONG TERM GOAL #2   Title  report =/> 75% reduction of Lt LE symptoms with walking ( 02/05/18)     Time  12    Period  Weeks    Status  Achieved      PT LONG TERM GOAL #3   Title  improve FOTO =/< 29% limited ( 02/05/18)     Time  12    Period  Weeks    Status  On-going      PT LONG TERM GOAL #4   Title  maintain level pelvic, not Lt ilium rotation =/> 1 wk (11/02/17)     Time  6    Period  Weeks    Status  Achieved      PT LONG TERM GOAL #5   Title  demo bilat hip strength =/> 5-/5 to allow her to start a walking program (02/05/18)     Time  12    Period  Weeks    Status  On-going      PT LONG TERM GOAL #6   Title  pain with intercourse decreased >/= 50% due to ability to relax the pelvic floor and understand vaginal health    Time  8    Period  Weeks    Status  On-going      PT LONG TERM GOAL #7   Title  urinary leakage decreased >/= 75% due to improve circular contraction of pelvic floor and increased strength    Baseline  none    Time  8    Period  Weeks    Status  Achieved      PT LONG TERM GOAL #8   Title  understand how to use moisturizers and lubricants for the vaginal area for good vaginal health    Time  8  Period  Weeks    Status  Achieved      PT LONG TERM GOAL  #9   TITLE  urge to void decreased >/= 75% due to ability to relax the pelvic floor muscles.    Time  4    Period  Months    Status  On-going            Plan - 05/20/18 1002    Clinical Impression Statement  Patient is having pain at level 9/10 with intercourse. Patient is not having urinary leakage.  Patient exercise program was scaled down so she is not feeling overwhelmed.  Patient will be looking into the vaginal dilators to expand the vaginal canal for intercourse.  Patient is having the urge to void during the day and  unable to contract the pelvic floor due to muscle tightness. Patient has difficulty with body awareness.  Patient has difficulty with cat cow due to trouble separating the pelvis from the spine.  Patient will benefit from skilled therapy to improve core strength and reduce pain with penile penetration.     Rehab Potential  Excellent    Clinical Impairments Affecting Rehab Potential  None    PT Frequency  1x / week    PT Duration  12 weeks    PT Treatment/Interventions  Biofeedback;Cryotherapy;Electrical Stimulation;Moist Heat;Ultrasound;Therapeutic activities;Therapeutic exercise;Patient/family education;Neuromuscular re-education;Manual techniques;Scar mobilization;Passive range of motion;Dry needling    PT Next Visit Plan  soft tissue work  internally and myofascial release; if has the dilator then educate    PT Home Exercise Plan  Access Code: [email or web address]     Consulted and Agree with Plan of Care  Patient       Patient will benefit from skilled therapeutic intervention in order to improve the following deficits and impairments:  Pain, Increased fascial restricitons, Decreased coordination, Decreased mobility, Increased muscle spasms, Decreased scar mobility, Decreased endurance, Decreased range of motion, Decreased strength, Decreased activity tolerance  Visit Diagnosis: Muscle weakness (generalized)  Cramp and spasm  Abnormal posture  Chronic left-sided low back pain with left-sided sciatica  Other muscle spasm     Problem List Patient Active Problem List   Diagnosis Date Noted  . Spondylosis of lumbosacral region without myelopathy or radiculopathy 09/07/2017  . Generalized abdominal pain 10/11/2016  . Dyspareunia in female 08/25/2015  . Irregular menses 04/16/2013  . Vitamin D deficiency 04/16/2013  . H/O: hypothyroidism 11/14/2012    Earlie Counts, PT 05/20/18 10:16 AM   [facility] [address], [place], [place], [address] Phone: [phone]   Fax:  [phone]  Name: Jamie Miller MRN: [id number] Date of Birth: 04-Nov-1970

## 2018-06-05 ENCOUNTER — Ambulatory Visit: Payer: 59 | Attending: Family Medicine | Admitting: Physical Therapy

## 2018-06-05 ENCOUNTER — Encounter: Payer: Self-pay | Admitting: Physical Therapy

## 2018-06-05 DIAGNOSIS — M6281 Muscle weakness (generalized): Secondary | ICD-10-CM | POA: Insufficient documentation

## 2018-06-05 DIAGNOSIS — M5442 Lumbago with sciatica, left side: Secondary | ICD-10-CM | POA: Insufficient documentation

## 2018-06-05 DIAGNOSIS — G8929 Other chronic pain: Secondary | ICD-10-CM | POA: Diagnosis present

## 2018-06-05 DIAGNOSIS — R252 Cramp and spasm: Secondary | ICD-10-CM | POA: Insufficient documentation

## 2018-06-05 DIAGNOSIS — M62838 Other muscle spasm: Secondary | ICD-10-CM | POA: Insufficient documentation

## 2018-06-05 DIAGNOSIS — R293 Abnormal posture: Secondary | ICD-10-CM | POA: Diagnosis present

## 2018-06-05 NOTE — Therapy (Signed)
[facility] [address] [place], [place], [address] Phone: [phone]   Fax:  [phone]  Physical Therapy Treatment  Patient Details  Name: Jamie Miller MRN: [id number] Date of Birth: December 05, 1970 Referring Provider (PT): Dr. Howard Pouch   Encounter Date: 06/05/2018  PT End of Session - 06/05/18 1018    Visit Number  28    Date for PT Re-Evaluation  08/23/18    Authorization Type  UHC    PT Start Time  8527    PT Stop Time  1055    PT Time Calculation (min)  40 min    Activity Tolerance  Patient tolerated treatment well;No increased pain    Behavior During Therapy  WFL for tasks assessed/performed       Past Medical History:  Diagnosis Date  . Allergy   . Anxiety   . Asthma   . Bladder spasms 10/2017   doing pelvic floor PT  . Chicken pox   . OAB (overactive bladder)   . Thyroid disease    was on low dose synthroid at one time, but removed and levels have been "normal" since and followed thorugh GYN  . Vitamin D deficiency     Past Surgical History:  Procedure Laterality Date  . APPENDECTOMY  1990  . BREAST BIOPSY  2000   biopsy  . CERVICAL CERCLAGE  2009  . HYMENECTOMY  2005  . WISDOM TOOTH EXTRACTION      There were no vitals filed for this visit.  Subjective Assessment - 06/05/18 1016    Subjective  I have ordered the vaginal dilator. I still has the constant urge. I can wait several hours before I have to urinate. The new butterfly exercise has been changing the muscles. No urinary leakage.  No back pain.     Pertinent History  PT for urinary incontinence 2009.      Diagnostic tests  x-rays of spine degenerative changes    Patient Stated Goals  not worry about urinary leakage, decreased pain with intercourse    Currently in Pain?  No/denies                    Pelvic Floor Special Questions - 06/05/18 0001    Pelvic Floor Internal Exam  Patient cofirms identification and  approves PT to assess muslce integrity and treatment    Exam Type  Vaginal    Strength  weak squeeze, no lift        [facility] Adult PT Treatment/Exercise - 06/05/18 0001      Neuro Re-ed    Neuro Re-ed Details   tapping of the pelvic floor muscles to promote a circular contracction in sidely      Manual Therapy   Manual Therapy  Soft tissue mobilization;Joint mobilization;Internal Pelvic Floor    Soft tissue mobilization  bulbocavernosus, ischiocavernosus, clitoral hood, along the pelvic rim    Myofascial Release  release of the perineum with pulling the pelvic floor downward    Internal Pelvic Floor  along the vaginal sphinter, levtor ani/ obturator internist               PT Short Term Goals - 04/01/18 1238      PT SHORT TERM GOAL #3   Title  ability to contract the lower abdominal muscles so she will be able to contract the pelvic floor muscles in a circular fashion    Time  4    Period  Weeks  Status  Achieved      PT SHORT TERM GOAL #4   Title  improved left hip ER to >/= 60 degrees passively    Time  4    Period  Weeks    Status  Achieved        PT Long Term Goals - 06/05/18 1101      PT LONG TERM GOAL #7   Title  urinary leakage decreased >/= 75% due to improve circular contraction of pelvic floor and increased strength    Time  8    Period  Weeks    Status  Achieved      PT LONG TERM GOAL #8   Title  understand how to use moisturizers and lubricants for the vaginal area for good vaginal health    Time  8    Period  Weeks    Status  Achieved      PT LONG TERM GOAL  #9   TITLE  urge to void decreased >/= 75% due to ability to relax the pelvic floor muscles.    Time  4    Period  Months    Status  On-going            Plan - 06/05/18 1058    Clinical Impression Statement  Patient reports no urinary leakage.  Patient is able to wait several hours till she urinates.  Patient reports she still has a constant urge to urinate.  Patient is not  having back pain.  During external perineal soft tissue work patient had pain in left lumbar.  Patient was able to feel for the first time the pelvic floor contract.  Patient has increased pelvic floor strength. Patient has ordered the vaginal dilator to start expanding the vaginal canal. Patient will benefit from skilled therapy to improve core strength and reduce pain with penile penetration.     Rehab Potential  Excellent    PT Frequency  1x / week    PT Duration  12 weeks    PT Treatment/Interventions  Biofeedback;Cryotherapy;Electrical Stimulation;Moist Heat;Ultrasound;Therapeutic activities;Therapeutic exercise;Patient/family education;Neuromuscular re-education;Manual techniques;Scar mobilization;Passive range of motion;Dry needling    PT Next Visit Plan  soft tissue work  internally and myofascial release; if has the dilator then educate    PT Home Exercise Plan  Access Code: [email or web address]     Consulted and Agree with Plan of Care  Patient       Patient will benefit from skilled therapeutic intervention in order to improve the following deficits and impairments:  Pain, Increased fascial restricitons, Decreased coordination, Decreased mobility, Increased muscle spasms, Decreased scar mobility, Decreased endurance, Decreased range of motion, Decreased strength, Decreased activity tolerance  Visit Diagnosis: Muscle weakness (generalized)  Cramp and spasm  Abnormal posture  Chronic left-sided low back pain with left-sided sciatica  Other muscle spasm     Problem List Patient Active Problem List   Diagnosis Date Noted  . Spondylosis of lumbosacral region without myelopathy or radiculopathy 09/07/2017  . Generalized abdominal pain 10/11/2016  . Dyspareunia in female 08/25/2015  . Irregular menses 04/16/2013  . Vitamin D deficiency 04/16/2013  . H/O: hypothyroidism 11/14/2012    Earlie Counts, PT 06/05/18 11:02 AM   [facility] [address] [place], [place], [address] Phone: [phone]   Fax:  [phone]  Name: Jamie Miller MRN: [id number] Date of Birth: 1970-12-02

## 2018-06-19 ENCOUNTER — Encounter: Payer: Self-pay | Admitting: Physical Therapy

## 2018-06-19 ENCOUNTER — Ambulatory Visit: Payer: 59 | Admitting: Physical Therapy

## 2018-06-19 DIAGNOSIS — M6281 Muscle weakness (generalized): Secondary | ICD-10-CM | POA: Diagnosis not present

## 2018-06-19 DIAGNOSIS — R252 Cramp and spasm: Secondary | ICD-10-CM

## 2018-06-19 DIAGNOSIS — R293 Abnormal posture: Secondary | ICD-10-CM

## 2018-06-19 DIAGNOSIS — M62838 Other muscle spasm: Secondary | ICD-10-CM

## 2018-06-19 DIAGNOSIS — G8929 Other chronic pain: Secondary | ICD-10-CM

## 2018-06-19 DIAGNOSIS — M5442 Lumbago with sciatica, left side: Secondary | ICD-10-CM

## 2018-06-19 NOTE — Patient Instructions (Signed)
PROTOCOL FOR DILATORS   1. Wash dilator with soap and water prior to insertion.    2. Lay on your back reclined. Knees are to be up and apart while on your bed or in the bathtub with warm water.   3. Lubricate the end of the dilator with a water-soluble lubricant.  4. Separate the labia.   5. Tense the pelvic floor muscles than relax; while relaxing, slide lubricated dilator into the vagina.    6. Tense muscles again while holding the dilator so it does not get pushed out; relax and slide it in a little further.   7. Try blowing out as if filling a balloon; this may relax the muscles and allow penetration.  Repeat blowing out to insert dilator further.  8. Keep dilator in for 10 minutes if tolerate, with the pelvic floor muscles relaxed to further stretch the canal.  9. If you find a tender spot that is no more than 3/10. Hold the dilator there for 90 seconds because it is a trigger point.   10. Never force the dilator into the canal.  11. 1 times per day  When you can perform with the dilator without increase in pain and feel like it is not even there progress to the next size.  You would start with the smallest dilator first hold for 2 min. Then go to the next size and place in vagina Before you further to next size you want to bring the dilator in out without difficulty and move hips around with dilator in.   [facility] [address] [place], [place] [address] Phone # [phone] Fax [phone]

## 2018-06-19 NOTE — Therapy (Signed)
[facility] [address], [place], [place], [address] Phone: [phone]   Fax:  [phone]  Physical Therapy Treatment  Patient Details  Name: Jamie Miller MRN: [id number] Date of Birth: 09-13-1970 Referring Provider (PT): Dr. Howard Pouch   Encounter Date: 06/19/2018  PT End of Session - 06/19/18 1018    Visit Number  29    Date for PT Re-Evaluation  08/23/18    Authorization Type  UHC    PT Start Time  2947    PT Stop Time  1055    PT Time Calculation (min)  40 min    Activity Tolerance  Patient tolerated treatment well;No increased pain    Behavior During Therapy  WFL for tasks assessed/performed       Past Medical History:  Diagnosis Date  . Allergy   . Anxiety   . Asthma   . Bladder spasms 10/2017   doing pelvic floor PT  . Chicken pox   . OAB (overactive bladder)   . Thyroid disease    was on low dose synthroid at one time, but removed and levels have been "normal" since and followed thorugh GYN  . Vitamin D deficiency     Past Surgical History:  Procedure Laterality Date  . APPENDECTOMY  1990  . BREAST BIOPSY  2000   biopsy  . CERVICAL CERCLAGE  2009  . HYMENECTOMY  2005  . WISDOM TOOTH EXTRACTION      There were no vitals filed for this visit.  Subjective Assessment - 06/19/18 1018    Subjective  My gadget came. I moved things and have neck and back pain.     Pertinent History  PT for urinary incontinence 2009.      Diagnostic tests  x-rays of spine degenerative changes    Patient Stated Goals  not worry about urinary leakage, decreased pain with intercourse    Currently in Pain?  Yes    Pain Score  9     Pain Location  Vagina    Pain Orientation  Mid    Pain Descriptors / Indicators  Spasm    Pain Type  Chronic pain    Pain Onset  More than a month ago    Pain Frequency  Intermittent    Aggravating Factors   intercourse    Pain Relieving Factors  no intercouse    Multiple Pain  Sites  Yes    Pain Score  3    Pain Location  Back    Pain Orientation  Right;Lower    Pain Descriptors / Indicators  Aching    Pain Type  Acute pain    Pain Onset  Yesterday    Pain Frequency  Intermittent    Aggravating Factors   lifting    Pain Relieving Factors  heat stretches, not moving                    Pelvic Floor Special Questions - 06/19/18 0001    Pelvic Floor Internal Exam  Patient cofirms identification and approves PT to assess muslce integrity and treatment    Exam Type  Vaginal    Strength  weak squeeze, no lift        [facility] Adult PT Treatment/Exercise - 06/19/18 0001      Manual Therapy   Manual Therapy  Internal Pelvic Floor    Manual therapy comments  education in reclined position to perform use dilator, how to place in the  vagina, how to find trigger points and hold for 90 second for it to release, how to progress with moving the dilator in and out of the introitus and moving her hips prior to going to the next size    Internal Pelvic Floor  along the left side of the pelvic floor muscles and bulbocavernosus, attempted to the right but was too sensitive             PT Education - 06/19/18 1055    Education provided  Yes    Education Details  how to use the dilator and progress herself in size    Person(s) Educated  Patient    Methods  Explanation;Demonstration;Verbal cues;Handout    Comprehension  Returned demonstration       PT Short Term Goals - 04/01/18 1238      PT SHORT TERM GOAL #3   Title  ability to contract the lower abdominal muscles so she will be able to contract the pelvic floor muscles in a circular fashion    Time  4    Period  Weeks    Status  Achieved      PT SHORT TERM GOAL #4   Title  improved left hip ER to >/= 60 degrees passively    Time  4    Period  Weeks    Status  Achieved        PT Long Term Goals - 06/19/18 1113      PT LONG TERM GOAL #1   Title  I with advanced HEP for core stability  (02/05/18)     Time  12    Period  Weeks    Status  On-going      PT LONG TERM GOAL #2   Title  report =/> 75% reduction of Lt LE symptoms with walking ( 02/05/18)     Baseline  50-60%    Time  12    Period  Weeks    Status  Achieved      PT LONG TERM GOAL #5   Title  demo bilat hip strength =/> 5-/5 to allow her to start a walking program (02/05/18)     Time  12    Period  Weeks    Status  On-going      PT LONG TERM GOAL #6   Title  pain with intercourse decreased >/= 50% due to ability to relax the pelvic floor and understand vaginal health    Baseline  initial penetration is 40% better but going deeper is no change    Time  8    Period  Weeks    Status  On-going      PT LONG TERM GOAL #7   Title  urinary leakage decreased >/= 75% due to improve circular contraction of pelvic floor and increased strength    Baseline  none    Time  8    Period  Weeks    Status  Achieved      PT LONG TERM GOAL #8   Title  understand how to use moisturizers and lubricants for the vaginal area for good vaginal health    Time  8    Period  Weeks    Status  Achieved      PT LONG TERM GOAL  #9   TITLE  urge to void decreased >/= 75% due to ability to relax the pelvic floor muscles.    Time  4    Period  Months  Status  On-going            Plan - 06/19/18 1103    Clinical Impression Statement  Patient is able to use her dilator and able to relax while inserting the dilator.  Patient has several trigger points she found in the pelvic floor muscles. Patient has increased in back pain due to moving bins of clothing. Patient has not change in the pelvic floor since last visit but hopefully with her using the dilators this will change.  Patient will benefit from skilled therapy to improve core strength and reduce pain with penile penetration.     Rehab Potential  Excellent    Clinical Impairments Affecting Rehab Potential  None    PT Frequency  1x / week    PT Duration  12 weeks    PT  Treatment/Interventions  Biofeedback;Cryotherapy;Electrical Stimulation;Moist Heat;Ultrasound;Therapeutic activities;Therapeutic exercise;Patient/family education;Neuromuscular re-education;Manual techniques;Scar mobilization;Passive range of motion;Dry needling    PT Next Visit Plan  soft tissue work  internally and myofascial release; see how she is doing with the dilator    PT Home Exercise Plan  Access Code: [email or web address]     Consulted and Agree with Plan of Care  Patient       Patient will benefit from skilled therapeutic intervention in order to improve the following deficits and impairments:  Pain, Increased fascial restricitons, Decreased coordination, Decreased mobility, Increased muscle spasms, Decreased scar mobility, Decreased endurance, Decreased range of motion, Decreased strength, Decreased activity tolerance  Visit Diagnosis: Muscle weakness (generalized)  Cramp and spasm  Abnormal posture  Chronic left-sided low back pain with left-sided sciatica  Other muscle spasm     Problem List Patient Active Problem List   Diagnosis Date Noted  . Spondylosis of lumbosacral region without myelopathy or radiculopathy 09/07/2017  . Generalized abdominal pain 10/11/2016  . Dyspareunia in female 08/25/2015  . Irregular menses 04/16/2013  . Vitamin D deficiency 04/16/2013  . H/O: hypothyroidism 11/14/2012    Earlie Counts, PT 06/19/18 11:15 AM   [facility] [address] [place], [place], [address] Phone: [phone]   Fax:  [phone]  Name: LINN CLAVIN MRN: [id number] Date of Birth: 12/11/1970

## 2018-06-25 ENCOUNTER — Other Ambulatory Visit: Payer: Self-pay | Admitting: Family Medicine

## 2018-06-25 DIAGNOSIS — Z1231 Encounter for screening mammogram for malignant neoplasm of breast: Secondary | ICD-10-CM

## 2018-06-26 ENCOUNTER — Ambulatory Visit (INDEPENDENT_AMBULATORY_CARE_PROVIDER_SITE_OTHER): Payer: 59

## 2018-06-26 DIAGNOSIS — Z1231 Encounter for screening mammogram for malignant neoplasm of breast: Secondary | ICD-10-CM

## 2018-07-04 ENCOUNTER — Encounter: Payer: Self-pay | Admitting: Physical Therapy

## 2018-07-04 ENCOUNTER — Telehealth: Payer: Self-pay | Admitting: Obstetrics & Gynecology

## 2018-07-04 ENCOUNTER — Encounter: Payer: Self-pay | Admitting: Obstetrics & Gynecology

## 2018-07-04 ENCOUNTER — Ambulatory Visit: Payer: 59 | Admitting: Physical Therapy

## 2018-07-04 DIAGNOSIS — M6281 Muscle weakness (generalized): Secondary | ICD-10-CM

## 2018-07-04 DIAGNOSIS — G8929 Other chronic pain: Secondary | ICD-10-CM

## 2018-07-04 DIAGNOSIS — R293 Abnormal posture: Secondary | ICD-10-CM

## 2018-07-04 DIAGNOSIS — M5442 Lumbago with sciatica, left side: Secondary | ICD-10-CM

## 2018-07-04 DIAGNOSIS — R252 Cramp and spasm: Secondary | ICD-10-CM

## 2018-07-04 NOTE — Telephone Encounter (Signed)
Please let her know I do not think she will have any issues with yeast with the dilator.  She will wash it with soap and water after use and allow it to dry so should not be a problem.  Thanks.

## 2018-07-04 NOTE — Patient Instructions (Signed)
  Lubrication . Used for intercourse to reduce friction . Avoid ones that have glycerin, warming gels, tingling gels, icing or cooling gel, scented . Avoid parabens due to a preservative similar to female sex hormone . May need to be reapplied once or several times during sexual activity . Can be applied to both partners genitals prior to vaginal penetration to minimize friction or irritation . Prevent irritation and mucosal tears that cause post coital pain and increased the risk of vaginal and urinary tract infections . Oil-based lubricants cannot be used with condoms due to breaking them down.  Least likely to irritate vaginal tissue.  . Plant based-lubes are safe . Silicone-based lubrication are thicker and last long and used for post-menopausal women  Vaginal Lubricators Here is a list of some suggested lubricators you can use for intercourse. Use the most hypoallergenic product.  You can place on you or your partner.   Slippery Stuff  Sylk or Sliquid Natural H2O ( good  if frequent UTI's)  Blossom Organics (www.blossom-organics.com)  Luvena   Coconut oil  PJur Woman Nude- water based lubricant, amazon  Uberlube- Amazon  Aloe Vera  Yes lubricant- [employer] Platinum-Silicone, [employer], [facility]  Olive and Bee intimate cream-  www.oliveandbee.com.au Things to avoid in lubricants are glycerin, warming gels, tingling gels, icing or cooling  gels, and scented gels.  Also avoid Vaseline. KY jelly, Replens, and Astroglide kills good bacteria(lactobacilli)  Things to avoid in the vaginal area . Do not use things to irritate the vulvar area . No lotions- see below . No soaps; can use Aveeno or Calendula cleanser if needed. Must be gentle . No deodorants . No douches . Good to sleep without underwear to let the vaginal area to air out . No scrubbing: spread the lips to let warm water rinse over labias and pat dry  Creams that can be used on the [employer] or [facility] [address] [place],  [address] Phone # [phone] Fax [phone]

## 2018-07-04 NOTE — Telephone Encounter (Signed)
Routing to Dr. Miller to review and advise.  

## 2018-07-04 NOTE — Therapy (Signed)
[facility] [address], [place], [place], [address] Phone: [phone]   Fax:  [phone]  Physical Therapy Treatment  Patient Details  Name: Jamie Miller MRN: [id number] Date of Birth: [birth year] Referring Provider (PT): Dr. Howard Pouch   Encounter Date: 07/04/2018  PT End of Session - 07/04/18 1101    Visit Number  30    Date for PT Re-Evaluation  08/23/18    Authorization Type  UHC    PT Start Time  1100    PT Stop Time  1140    PT Time Calculation (min)  40 min    Activity Tolerance  Patient tolerated treatment well;No increased pain    Behavior During Therapy  WFL for tasks assessed/performed       Past Medical History:  Diagnosis Date  . Allergy   . Anxiety   . Asthma   . Bladder spasms 10/2017   doing pelvic floor PT  . Chicken pox   . OAB (overactive bladder)   . Thyroid disease    was on low dose synthroid at one time, but removed and levels have been "normal" since and followed thorugh GYN  . Vitamin D deficiency     Past Surgical History:  Procedure Laterality Date  . APPENDECTOMY  1990  . BREAST BIOPSY  2000   biopsy  . CERVICAL CERCLAGE  2009  . HYMENECTOMY  2005  . WISDOM TOOTH EXTRACTION      There were no vitals filed for this visit.  Subjective Assessment - 07/04/18 1103    Subjective  I tried using my dilator at home but did not go as smoothly. I had pain with it.     Pertinent History  PT for urinary incontinence 2009.      Diagnostic tests  x-rays of spine degenerative changes    Patient Stated Goals  not worry about urinary leakage, decreased pain with intercourse    Currently in Pain?  Yes    Pain Score  9     Pain Location  Vagina    Pain Orientation  Mid    Pain Descriptors / Indicators  Spasm    Pain Type  Chronic pain    Pain Onset  More than a month ago    Pain Frequency  Intermittent    Aggravating Factors   intercourse    Pain Relieving Factors  no  intercourse    Multiple Pain Sites  No                    Pelvic Floor Special Questions - 07/04/18 0001    Pelvic Floor Internal Exam  Patient cofirms identification and approves PT to assess muslce integrity and treatment    Exam Type  Vaginal    Strength  weak squeeze, no lift        [facility] Adult PT Treatment/Exercise - 07/04/18 0001      Manual Therapy   Manual Therapy  Internal Pelvic Floor    Manual therapy comments  education in reclined position to perform use dilator, how to place in the vagina, how to find trigger points and hold for 90 second for it to release, how to progress with moving the dilator in and out of the introitus and moving her hips prior to going to the next size    Internal Pelvic Floor  bil. sides of the levator ani and obturator internist, right band felt just inside the introitus  PT Education - 07/04/18 1321    Education provided  Yes    Education Details  reveiwed how to use the dilator again and feel more relaxed; information on lubricants    Person(s) Educated  Patient    Methods  Explanation;Demonstration;Handout    Comprehension  Verbalized understanding;Returned demonstration       PT Short Term Goals - 04/01/18 1238      PT SHORT TERM GOAL #3   Title  ability to contract the lower abdominal muscles so she will be able to contract the pelvic floor muscles in a circular fashion    Time  4    Period  Weeks    Status  Achieved      PT SHORT TERM GOAL #4   Title  improved left hip ER to >/= 60 degrees passively    Time  4    Period  Weeks    Status  Achieved        PT Long Term Goals - 06/19/18 1113      PT LONG TERM GOAL #1   Title  I with advanced HEP for core stability (02/05/18)     Time  12    Period  Weeks    Status  On-going      PT LONG TERM GOAL #2   Title  report =/> 75% reduction of Lt LE symptoms with walking ( 02/05/18)     Baseline  50-60%    Time  12    Period  Weeks    Status   Achieved      PT LONG TERM GOAL #5   Title  demo bilat hip strength =/> 5-/5 to allow her to start a walking program (02/05/18)     Time  12    Period  Weeks    Status  On-going      PT LONG TERM GOAL #6   Title  pain with intercourse decreased >/= 50% due to ability to relax the pelvic floor and understand vaginal health    Baseline  initial penetration is 40% better but going deeper is no change    Time  8    Period  Weeks    Status  On-going      PT LONG TERM GOAL #7   Title  urinary leakage decreased >/= 75% due to improve circular contraction of pelvic floor and increased strength    Baseline  none    Time  8    Period  Weeks    Status  Achieved      PT LONG TERM GOAL #8   Title  understand how to use moisturizers and lubricants for the vaginal area for good vaginal health    Time  8    Period  Weeks    Status  Achieved      PT LONG TERM GOAL  #9   TITLE  urge to void decreased >/= 75% due to ability to relax the pelvic floor muscles.    Time  4    Period  Months    Status  On-going            Plan - 07/04/18 1102    Clinical Impression Statement  Patient was having difficulty with using a dilator at home so went over in therapy. After further instruction she was able to perform and feel comfortable without 3/10 pain. Patient has a band on the right intoitus leading into the vaginal canal. Patient will haPatient will benefit  from skilled therapy to improve core strength and reduce pain with penile penetration.     Rehab Potential  Excellent    Clinical Impairments Affecting Rehab Potential  None    PT Frequency  1x / week    PT Duration  12 weeks    PT Treatment/Interventions  Biofeedback;Cryotherapy;Electrical Stimulation;Moist Heat;Ultrasound;Therapeutic activities;Therapeutic exercise;Patient/family education;Neuromuscular re-education;Manual techniques;Scar mobilization;Passive range of motion;Dry needling    PT Next Visit Plan  soft tissue work  internally and  myofascial release; see how she is doing with the dilator    PT Home Exercise Plan  Access Code: [email or web address]     Consulted and Agree with Plan of Care  Patient       Patient will benefit from skilled therapeutic intervention in order to improve the following deficits and impairments:  Pain, Increased fascial restricitons, Decreased coordination, Decreased mobility, Increased muscle spasms, Decreased scar mobility, Decreased endurance, Decreased range of motion, Decreased strength, Decreased activity tolerance  Visit Diagnosis: Muscle weakness (generalized)  Cramp and spasm  Abnormal posture  Chronic left-sided low back pain with left-sided sciatica     Problem List Patient Active Problem List   Diagnosis Date Noted  . Spondylosis of lumbosacral region without myelopathy or radiculopathy 09/07/2017  . Generalized abdominal pain 10/11/2016  . Dyspareunia in female 08/25/2015  . Irregular menses 04/16/2013  . Vitamin D deficiency 04/16/2013  . H/O: hypothyroidism 11/14/2012    Earlie Counts, PT 07/04/18 1:26 PM   [facility] [address], [place], [place], [address] Phone: [phone]   Fax:  [phone]  Name: Jamie Miller MRN: [id number] Date of Birth: 08-25-1971

## 2018-07-04 NOTE — Telephone Encounter (Signed)
Patient sent the following correspondence through [place]. Routing to triage to assist patient with request.  Hello, Dr. Sabra Heck,    My pelvic floor PT has suggested I use a dilator 5x a week for at home exercises. With my history of yeast and UTIs, I wanted to see if that would be wise. Currently she has me doing non invasive, non internal at home exercises. I am meeting the PT 2x a month where she does internal pelvic floor exercising.    I have seen improvement with my bladder spasms, but no real significant improvement with painful intercourse.    Thank you very much,  Jamie Miller

## 2018-07-05 NOTE — Telephone Encounter (Signed)
Left message to call Danice Dippolito, RN at [facility] [phone].   

## 2018-07-08 NOTE — Telephone Encounter (Signed)
Spoke with patient, advised as seen below per Dr. Miller. Patient verbalizes understanding and is agreeable.   Encounter closed.  

## 2018-07-08 NOTE — Telephone Encounter (Signed)
Patient returned call

## 2018-08-09 ENCOUNTER — Encounter: Payer: Self-pay | Admitting: Physical Therapy

## 2018-08-09 ENCOUNTER — Ambulatory Visit: Payer: 59 | Attending: Family Medicine | Admitting: Physical Therapy

## 2018-08-09 ENCOUNTER — Encounter

## 2018-08-09 DIAGNOSIS — R252 Cramp and spasm: Secondary | ICD-10-CM | POA: Insufficient documentation

## 2018-08-09 DIAGNOSIS — R293 Abnormal posture: Secondary | ICD-10-CM | POA: Diagnosis present

## 2018-08-09 DIAGNOSIS — M6281 Muscle weakness (generalized): Secondary | ICD-10-CM | POA: Diagnosis present

## 2018-08-09 NOTE — Therapy (Signed)
[facility] [address] [place], [place], [address] Phone: [phone]   Fax:  [phone]  Physical Therapy Treatment  Patient Details  Name: Jamie Miller MRN: [id number] Date of Birth: Nov 16, 1970 Referring Provider (PT): Dr. Howard Pouch   Encounter Date: 08/09/2018  PT End of Session - 08/09/18 0941    Visit Number  31    Date for PT Re-Evaluation  08/23/18    Authorization Type  U[employer]    PT Start Time  0930    PT Stop Time  1010    PT Time Calculation (min)  40 min    Activity Tolerance  Patient tolerated treatment well;No increased pain    Behavior During Therapy  WFL for tasks assessed/performed       Past Medical History:  Diagnosis Date  . Allergy   . Anxiety   . Asthma   . Bladder spasms 10/2017   doing pelvic floor PT  . Chicken pox   . OAB (overactive bladder)   . Thyroid disease    was on low dose synthroid at one time, but removed and levels have been "normal" since and followed thorugh GYN  . Vitamin D deficiency     Past Surgical History:  Procedure Laterality Date  . APPENDECTOMY  1990  . BREAST BIOPSY  2000   biopsy  . CERVICAL CERCLAGE  2009  . HYMENECTOMY  2005  . WISDOM TOOTH EXTRACTION      There were no vitals filed for this visit.  Subjective Assessment - 08/09/18 0934    Subjective  I am having trouble with the dilator but it is mental so I do not do it as often. I used the dilator correctly. Intercourse has not hurt as much and is 8/10 and my husband says it does not feel as tight.     Pertinent History  PT for urinary incontinence 2009.      Diagnostic tests  x-rays of spine degenerative changes    Patient Stated Goals  not worry about urinary leakage, decreased pain with intercourse    Currently in Pain?  Yes    Pain Score  8     Pain Location  Vagina    Pain Orientation  Mid    Pain Descriptors / Indicators  Spasm    Pain Type  Chronic pain    Pain Onset  More than a  month ago    Pain Frequency  Intermittent    Aggravating Factors   intercourse    Pain Relieving Factors  no intercourse    Multiple Pain Sites  No                    Pelvic Floor Special Questions - 08/09/18 0001    Pelvic Floor Internal Exam  Patient cofirms identification and approves PT to assess muslce integrity and treatment    Exam Type  Vaginal        [facility] Adult PT Treatment/Exercise - 08/09/18 0001      Self-Care   Self-Care  Other Self-Care Comments    Other Self-Care Comments   instruction on how to work on the positive aspect of using the dilator instead of negative, how the dilator is making intercourse more comfortable, less UTI, decresaed muscle cramps      Manual Therapy   Manual Therapy  Internal Pelvic Floor;Myofascial release    Soft tissue mobilization  of the obturator internist and perineum with fingers medially t othe  ishcial tuberosity releaseing through the planes of fascia    Internal Pelvic Floor  along the right sphincter where there is a ridge to flaten the tissue ; release of the obturator internist and along the posterior introitus               PT Short Term Goals - 04/01/18 1238      PT SHORT TERM GOAL #3   Title  ability to contract the lower abdominal muscles so she will be able to contract the pelvic floor muscles in a circular fashion    Time  4    Period  Weeks    Status  Achieved      PT SHORT TERM GOAL #4   Title  improved left hip ER to >/= 60 degrees passively    Time  4    Period  Weeks    Status  Achieved        PT Long Term Goals - 06/19/18 1113      PT LONG TERM GOAL #1   Title  I with advanced HEP for core stability (02/05/18)     Time  12    Period  Weeks    Status  On-going      PT LONG TERM GOAL #2   Title  report =/> 75% reduction of Lt LE symptoms with walking ( 02/05/18)     Baseline  50-60%    Time  12    Period  Weeks    Status  Achieved      PT LONG TERM GOAL #5   Title  demo bilat  hip strength =/> 5-/5 to allow her to start a walking program (02/05/18)     Time  12    Period  Weeks    Status  On-going      PT LONG TERM GOAL #6   Title  pain with intercourse decreased >/= 50% due to ability to relax the pelvic floor and understand vaginal health    Baseline  initial penetration is 40% better but going deeper is no change    Time  8    Period  Weeks    Status  On-going      PT LONG TERM GOAL #7   Title  urinary leakage decreased >/= 75% due to improve circular contraction of pelvic floor and increased strength    Baseline  none    Time  8    Period  Weeks    Status  Achieved      PT LONG TERM GOAL #8   Title  understand how to use moisturizers and lubricants for the vaginal area for good vaginal health    Time  8    Period  Weeks    Status  Achieved      PT LONG TERM GOAL  #9   TITLE  urge to void decreased >/= 75% due to ability to relax the pelvic floor muscles.    Time  4    Period  Months    Status  On-going            Plan - 08/09/18 0942    Clinical Impression Statement  Patient is not able to use the dilator daily due to the mental component and negative thoughts. Discussed with patient on positive thoughts and how she can relax using the dilator. Patient gets cramps with the dilator due to release of the muscles. Patient has a band on the right side of the  vaginal canal that is tight but not on the left. Patient reports intercourse pain is now 8/10 instead of 10/10 and he husband says it is looser. Patient will benefit from skilled therapy to reduce pain with penile penetration.     Rehab Potential  Excellent    Clinical Impairments Affecting Rehab Potential  None    PT Frequency  1x / week    PT Duration  12 weeks    PT Treatment/Interventions  Biofeedback;Cryotherapy;Electrical Stimulation;Moist Heat;Ultrasound;Therapeutic activities;Therapeutic exercise;Patient/family education;Neuromuscular re-education;Manual techniques;Scar  mobilization;Passive range of motion;Dry needling    PT Next Visit Plan  soft tissue work  internally and myofascial release; see what MD says; renewal in 2 visits    PT Home Exercise Plan  Access Code: [email or web address]     Consulted and Agree with Plan of Care  Patient       Patient will benefit from skilled therapeutic intervention in order to improve the following deficits and impairments:  Pain, Increased fascial restricitons, Decreased coordination, Decreased mobility, Increased muscle spasms, Decreased scar mobility, Decreased endurance, Decreased range of motion, Decreased strength, Decreased activity tolerance  Visit Diagnosis: Muscle weakness (generalized)  Cramp and spasm  Abnormal posture     Problem List Patient Active Problem List   Diagnosis Date Noted  . Spondylosis of lumbosacral region without myelopathy or radiculopathy 09/07/2017  . Generalized abdominal pain 10/11/2016  . Dyspareunia in female 08/25/2015  . Irregular menses 04/16/2013  . Vitamin D deficiency 04/16/2013  . H/O: hypothyroidism 11/14/2012    Earlie Counts, PT 08/09/18 10:18 AM   [facility] [address], [place], [place], [address] Phone: [phone]   Fax:  [phone]  Name: ZENITH KERCHEVAL MRN: [id number] Date of Birth: 02/23/1971

## 2018-08-12 ENCOUNTER — Encounter: Payer: Self-pay | Admitting: Obstetrics & Gynecology

## 2018-08-12 ENCOUNTER — Ambulatory Visit (INDEPENDENT_AMBULATORY_CARE_PROVIDER_SITE_OTHER): Payer: 59 | Admitting: Obstetrics & Gynecology

## 2018-08-12 ENCOUNTER — Other Ambulatory Visit: Payer: Self-pay

## 2018-08-12 VITALS — BP 108/60 | HR 92 | Resp 16 | Ht 63.0 in | Wt 160.0 lb

## 2018-08-12 DIAGNOSIS — Z1211 Encounter for screening for malignant neoplasm of colon: Secondary | ICD-10-CM

## 2018-08-12 DIAGNOSIS — Z01419 Encounter for gynecological examination (general) (routine) without abnormal findings: Secondary | ICD-10-CM

## 2018-08-12 DIAGNOSIS — N914 Secondary oligomenorrhea: Secondary | ICD-10-CM | POA: Diagnosis not present

## 2018-08-12 DIAGNOSIS — Z0000 Encounter for general adult medical examination without abnormal findings: Secondary | ICD-10-CM | POA: Diagnosis not present

## 2018-08-12 LAB — POCT URINALYSIS DIPSTICK
BILIRUBIN UA: NEGATIVE
Blood, UA: NEGATIVE
Glucose, UA: NEGATIVE
KETONES UA: NEGATIVE
Leukocytes, UA: NEGATIVE
Nitrite, UA: NEGATIVE
Protein, UA: NEGATIVE
UROBILINOGEN UA: 0.2 U/dL
pH, UA: 5 (ref 5.0–8.0)

## 2018-08-12 NOTE — Progress Notes (Signed)
47 y.o. G54P0101 Married White or Caucasian female here for annual exam.  Skipping cycles.  Has gained 10 pounds this year.  She does feel her breasts are larger this year.  Did PT with physical therapist.  Does have some questions about a vaginal band.  Back pain has resolved.    Is able to be SA.    PCP:  Dr. Raoul Pitch.  Is going to schedule appt for lab work and having her Tdap update.  Patient's last menstrual period was 05/25/2018.          Sexually active: Yes.    The current method of family planning is vasectomy.    Exercising: No.   Smoker:  no  Health Maintenance: Pap:  12/11/16 Neg. HR HPV:neg  History of abnormal Pap:  Yes, in college  MMG:  06/26/18 BIRADS1:Neg  TDaP:  2009 (will do with PCP) Screening Labs: PCP  FI:[id number]    reports that she has never smoked. She has never used smokeless tobacco. She reports that she does not drink alcohol or use drugs.  Past Medical History:  Diagnosis Date  . Allergy   . Anxiety   . Asthma   . Bladder spasms 10/2017   doing pelvic floor PT  . Chicken pox   . OAB (overactive bladder)   . Thyroid disease    was on low dose synthroid at one time, but removed and levels have been "normal" since and followed thorugh GYN  . Vitamin D deficiency     Past Surgical History:  Procedure Laterality Date  . APPENDECTOMY  1990  . BREAST BIOPSY  2000   biopsy  . CERVICAL CERCLAGE  2009  . HYMENECTOMY  2005  . WISDOM TOOTH EXTRACTION      Current Outpatient Medications  Medication Sig Dispense Refill  . cholecalciferol (VITAMIN D) 1000 units tablet Take 2,000 Units by mouth daily.    Marland Kitchen loratadine (CLARITIN) 10 MG tablet Take 10 mg by mouth daily.    . MULTIPLE VITAMIN PO Take 1 tablet by mouth daily.    . Probiotic Product (ACIDOPHILUS/GOAT MILK) CAPS Take by mouth.    . vitamin E 200 UNIT capsule Take 200 Units by mouth daily.     No current facility-administered medications for this visit.     Family History  Problem Relation  Age of Onset  . Hyperlipidemia Mother   . Diabetes Father   . Prostate cancer Father        prostate  . Prostate cancer Maternal Grandfather        prostate  . Prostate cancer Paternal Grandfather        prostate  . Arthritis Maternal Grandmother   . Lung cancer Paternal Grandmother     Review of Systems  Genitourinary: Positive for vaginal discharge.       Breast pain   All other systems reviewed and are negative.   Exam:   BP 108/60 (BP Location: Right Arm, Patient Position: Sitting, Cuff Size: Large)   Pulse 92   Resp 16   Ht 5\' 3"  (1.6 m)   Wt 160 lb (72.6 kg)   LMP 05/25/2018   BMI 28.34 kg/m   Height: 5\' 3"  (160 cm)  Ht Readings from Last 3 Encounters:  08/12/18 5\' 3"  (1.6 m)  04/04/18 5\' 4"  (1.626 m)  01/25/18 5\' 4"  (1.626 m)    General appearance: alert, cooperative and appears stated age Head: Normocephalic, without obvious abnormality, atraumatic Neck: no adenopathy, supple, symmetrical, trachea midline  and thyroid normal to inspection and palpation Lungs: clear to auscultation bilaterally Breasts: normal appearance, no masses or tenderness Heart: regular rate and rhythm Abdomen: soft, non-tender; bowel sounds normal; no masses,  no organomegaly Extremities: extremities normal, atraumatic, no cyanosis or edema Skin: Skin color, texture, turgor normal. No rashes or lesions Lymph nodes: Cervical, supraclavicular, and axillary nodes normal. No abnormal inguinal nodes palpated Neurologic: Grossly normal   Pelvic: External genitalia:  no lesions              Urethra:  normal appearing urethra with no masses, tenderness or lesions              Bartholins and Skenes: normal                 Vagina: normal appearing vagina with normal color and discharge, no lesions, band inside vagina is less tight              Cervix: no lesions              Pap taken: No. Bimanual Exam:  Uterus:  normal size, contour, position, consistency, mobility, non-tender               Adnexa: normal adnexa and no mass, fullness, tenderness               Rectovaginal: Confirms               Anus:  normal sphincter tone, no lesions  Chaperone was present for exam.  A:  Well Woman with normal exam Irregular cycles over the past two years, perimenopausal Dyspareunia H/O low vit D  P:   Mammogram guidelines reviewed.   pap smear with neg HR HPV 4/18.  Not indicated today ACS guidelines reviewed.  IFOB given to pt. FSH and estradiol level obtained.  May need provera challenge if [facility] is low. Will have Tdap updated with PCP and do blood work Return annually or prn

## 2018-08-13 LAB — FOLLICLE STIMULATING HORMONE: FSH: 74.4 m[IU]/mL

## 2018-08-13 LAB — ESTRADIOL: ESTRADIOL: 92.1 pg/mL

## 2018-08-14 ENCOUNTER — Ambulatory Visit: Payer: 59 | Admitting: Physical Therapy

## 2018-08-14 ENCOUNTER — Encounter: Payer: Self-pay | Admitting: Physical Therapy

## 2018-08-14 DIAGNOSIS — R252 Cramp and spasm: Secondary | ICD-10-CM

## 2018-08-14 DIAGNOSIS — M6281 Muscle weakness (generalized): Secondary | ICD-10-CM

## 2018-08-14 NOTE — Therapy (Signed)
[facility] [address] [place], [place], [address] Phone: [phone]   Fax:  [phone]  Physical Therapy Treatment  Patient Details  Name: Jamie Miller MRN: [id number] Date of Birth: [birth year] Referring Provider (PT): Dr. Howard Pouch   Encounter Date: 08/14/2018  PT End of Session - 08/14/18 1022    Visit Number  32    Date for PT Re-Evaluation  08/23/18    Authorization Type  UHC    PT Start Time  0938    PT Stop Time  1055    PT Time Calculation (min)  40 min    Activity Tolerance  Patient tolerated treatment well;No increased pain    Behavior During Therapy  WFL for tasks assessed/performed       Past Medical History:  Diagnosis Date  . Allergy   . Anxiety   . Asthma   . Bladder spasms 10/2017   doing pelvic floor PT  . Chicken pox   . OAB (overactive bladder)   . Thyroid disease    was on low dose synthroid at one time, but removed and levels have been "normal" since and followed thorugh GYN  . Vitamin D deficiency     Past Surgical History:  Procedure Laterality Date  . APPENDECTOMY  1990  . BREAST BIOPSY  2000   biopsy  . CERVICAL CERCLAGE  2009  . HYMENECTOMY  2005  . WISDOM TOOTH EXTRACTION      There were no vitals filed for this visit.  Subjective Assessment - 08/14/18 1019    Subjective  MD said the rim was vaginal tissue and dilator will help. MD said the vaginal exam was easier to perform due to improve opening of the vaginal exam. It is the same with using the dilator.     Pertinent History  PT for urinary incontinence 2009.      Diagnostic tests  x-rays of spine degenerative changes    Patient Stated Goals  not worry about urinary leakage, decreased pain with intercourse    Pain Score  8     Pain Location  Vagina    Pain Orientation  Mid    Pain Descriptors / Indicators  Spasm    Pain Type  Chronic pain    Pain Onset  More than a month ago    Pain Frequency  Intermittent     Aggravating Factors   intercourse    Pain Relieving Factors  no intercourse    Multiple Pain Sites  No                    Pelvic Floor Special Questions - 08/14/18 0001    Pelvic Floor Internal Exam  Patient cofirms identification and approves PT to assess muslce integrity and treatment    Exam Type  Vaginal        [facility] Adult PT Treatment/Exercise - 08/14/18 0001      Neuro Re-ed    Neuro Re-ed Details   sit on green physioball-pelvic sway, pelvic circle, pelvic tilt, prone on ball with hip IR and breathing to stretch pelvic floor      Lumbar Exercises: Stretches   Active Hamstring Stretch  Right;Left;1 rep;60 seconds   supine with strap; bil. hip adductor   Other Lumbar Stretch Exercise  supine with feet on the ball and move knees to chest stretch inner thighs with breath      Manual Therapy   Manual Therapy  Internal Pelvic Floor  Internal Pelvic Floor  along the right sphincter where there is a ridge to flaten the tissue ; release of the obturator internist and along the posterior introitus               PT Short Term Goals - 04/01/18 1238      PT SHORT TERM GOAL #3   Title  ability to contract the lower abdominal muscles so she will be able to contract the pelvic floor muscles in a circular fashion    Time  4    Period  Weeks    Status  Achieved      PT SHORT TERM GOAL #4   Title  improved left hip ER to >/= 60 degrees passively    Time  4    Period  Weeks    Status  Achieved        PT Long Term Goals - 08/14/18 1354      PT LONG TERM GOAL #1   Title  I with advanced HEP for core stability (02/05/18)     Baseline  still learning    Time  12    Period  Weeks    Status  On-going      PT LONG TERM GOAL #2   Title  report =/> 75% reduction of Lt LE symptoms with walking ( 02/05/18)     Baseline  50-60%    Time  12    Period  Weeks    Status  Achieved      PT LONG TERM GOAL #3   Title  improve FOTO =/< 29% limited ( 02/05/18)      Time  12    Period  Weeks    Status  On-going      PT LONG TERM GOAL #4   Title  maintain level pelvic, not Lt ilium rotation =/> 1 wk (11/02/17)     Time  6    Status  Achieved      PT LONG TERM GOAL #5   Title  demo bilat hip strength =/> 5-/5 to allow her to start a walking program (02/05/18)     Time  12    Period  Weeks    Status  On-going      PT LONG TERM GOAL #6   Title  pain with intercourse decreased >/= 50% due to ability to relax the pelvic floor and understand vaginal health    Baseline  initial penetration is 40% better but going deeper is no change    Time  8    Period  Weeks    Status  On-going      PT LONG TERM GOAL #7   Title  urinary leakage decreased >/= 75% due to improve circular contraction of pelvic floor and increased strength    Baseline  none    Time  8    Period  Weeks    Status  Achieved      PT LONG TERM GOAL #8   Title  understand how to use moisturizers and lubricants for the vaginal area for good vaginal health    Time  8    Period  Weeks    Status  Achieved      PT LONG TERM GOAL  #9   TITLE  urge to void decreased >/= 75% due to ability to relax the pelvic floor muscles.    Time  4    Period  Months    Status  On-going  Plan - 08/14/18 1024    Clinical Impression Statement  Patient was able to have a vaginal exam and the MD noticed it was easier to perform the exam due to improve elasticity of the muscles. MD reports the tight band is due to vaginal tissue. Patient is working with her dilator and on the mental component. Patient will benefit from skilled therapy to improve elongation of vaginal muscles for good vaginal health and penile penetration.     Rehab Potential  Excellent    Clinical Impairments Affecting Rehab Potential  None    PT Frequency  1x / week    PT Duration  12 weeks    PT Treatment/Interventions  Biofeedback;Cryotherapy;Electrical Stimulation;Moist Heat;Ultrasound;Therapeutic activities;Therapeutic  exercise;Patient/family education;Neuromuscular re-education;Manual techniques;Scar mobilization;Passive range of motion;Dry needling    PT Next Visit Plan  soft tissue work  internally and myofascial release;  renewal / discharge next visit    PT Home Exercise Plan  Access Code: [email or web address]     Consulted and Agree with Plan of Care  Patient       Patient will benefit from skilled therapeutic intervention in order to improve the following deficits and impairments:  Pain, Increased fascial restricitons, Decreased coordination, Decreased mobility, Increased muscle spasms, Decreased scar mobility, Decreased endurance, Decreased range of motion, Decreased strength, Decreased activity tolerance  Visit Diagnosis: Muscle weakness (generalized)  Cramp and spasm     Problem List Patient Active Problem List   Diagnosis Date Noted  . Spondylosis of lumbosacral region without myelopathy or radiculopathy 09/07/2017  . Generalized abdominal pain 10/11/2016  . Dyspareunia in female 08/25/2015  . Irregular menses 04/16/2013  . Vitamin D deficiency 04/16/2013  . H/O: hypothyroidism 11/14/2012    Earlie Counts, PT 08/14/18 1:57 PM   [place] [facility] [address], [place], [place], [address] Phone: [phone]   Fax:  [phone]  Name: Jamie Miller MRN: [id number] Date of Birth: [birth year]

## 2018-08-19 ENCOUNTER — Telehealth: Payer: Self-pay | Admitting: Obstetrics & Gynecology

## 2018-08-19 ENCOUNTER — Encounter: Payer: Self-pay | Admitting: Obstetrics & Gynecology

## 2018-08-19 NOTE — Telephone Encounter (Signed)
Agree with recommendations to use a steroid--ointment is better.  Can use OTC hydrocortisone every 4-6 hours.  If not significantly better in a day or two, should be seen.

## 2018-08-19 NOTE — Telephone Encounter (Signed)
Patient sent the following correspondence through [place]. Routing to triage to assist patient with request.  Hello, Dr. Sabra Heck,    I saw you last week and all was well, this morning I woke up to the lip portion of my vagina area very irritated, red, and slightly itchy. Would baby's Destin cream be appropriate for this area?    Thanks very much,  Jamie Miller

## 2018-08-19 NOTE — Telephone Encounter (Signed)
Spoke with patient, advised as seen below per Dr. Miller. Patient verbalizes understanding and is agreeable.   Encounter closed.  

## 2018-08-19 NOTE — Telephone Encounter (Signed)
Spoke with patient. Patient reports she woke up today, labia is red and irritated, some external itching. No new vaginal d/c or odor. Has tried coconut oil and applied ice with no changes. Used a new loofah and tried on new pants, unsure if this is the cause. Has pelvic PT on Wed.   Patient thinks she has used hydrocortisone externally in the past, but is not sure. Has Desitin, unsure if she wants to use it. Advised Desitin may provide protection, but not itch relief. Advised I will review hydrocortisone use with Dr. Sabra Heck and return call with recommendations, patient agreeable.   Dr. Sabra Heck -please advise.

## 2018-08-19 NOTE — Telephone Encounter (Signed)
Left message to call Rockne Dearinger, RN at [facility] [phone].   

## 2018-08-20 ENCOUNTER — Telehealth: Payer: Self-pay | Admitting: Obstetrics & Gynecology

## 2018-08-20 NOTE — Telephone Encounter (Signed)
Patient has decided she would like to come in for her irritation after talking with [facility] yesterday. To triage to assist with scheduling with Dr.Miller.

## 2018-08-20 NOTE — Telephone Encounter (Signed)
Vaginal irritation.  Office visit with Dr. Sabra Heck.  Work in appointment scheduled for 1115 08/22/18 with Dr. Sabra Heck.  Spoke with husband, okay per designated party release form.  He will have her call back if appointment does not work for her.  Encounter closed.

## 2018-08-21 ENCOUNTER — Encounter: Payer: Self-pay | Admitting: Obstetrics and Gynecology

## 2018-08-21 ENCOUNTER — Ambulatory Visit (INDEPENDENT_AMBULATORY_CARE_PROVIDER_SITE_OTHER): Payer: 59 | Admitting: Obstetrics and Gynecology

## 2018-08-21 ENCOUNTER — Ambulatory Visit: Payer: 59 | Admitting: Physical Therapy

## 2018-08-21 ENCOUNTER — Other Ambulatory Visit: Payer: Self-pay

## 2018-08-21 VITALS — BP 122/82 | HR 76 | Wt 162.8 lb

## 2018-08-21 DIAGNOSIS — R252 Cramp and spasm: Secondary | ICD-10-CM

## 2018-08-21 DIAGNOSIS — N760 Acute vaginitis: Secondary | ICD-10-CM

## 2018-08-21 DIAGNOSIS — R293 Abnormal posture: Secondary | ICD-10-CM

## 2018-08-21 DIAGNOSIS — M6281 Muscle weakness (generalized): Secondary | ICD-10-CM

## 2018-08-21 MED ORDER — FLUCONAZOLE 150 MG PO TABS: 150.0000 mg | ORAL_TABLET | Freq: Once | ORAL | 0 refills | Status: AC

## 2018-08-21 NOTE — Therapy (Signed)
[facility] [address] [place], [place], [address] Phone: [phone]   Fax:  [phone]  Physical Therapy Treatment  Patient Details  Name: Jamie Miller MRN: [id number] Date of Birth: [birth year] Referring Provider (PT): Dr. Howard Pouch   Encounter Date: 08/21/2018  PT End of Session - 08/21/18 1018    Visit Number  33    Date for PT Re-Evaluation  12/23/18    Authorization Type  [employer]    PT Start Time  6195    PT Stop Time  1055    PT Time Calculation (min)  40 min    Activity Tolerance  Patient tolerated treatment well;No increased pain    Behavior During Therapy  WFL for tasks assessed/performed       Past Medical History:  Diagnosis Date  . Allergy   . Anxiety   . Asthma   . Bladder spasms 10/2017   doing pelvic floor PT  . Chicken pox   . OAB (overactive bladder)   . Thyroid disease    was on low dose synthroid at one time, but removed and levels have been "normal" since and followed thorugh GYN  . Vitamin D deficiency     Past Surgical History:  Procedure Laterality Date  . APPENDECTOMY  1990  . BREAST BIOPSY  2000   biopsy  . CERVICAL CERCLAGE  2009  . HYMENECTOMY  2005  . WISDOM TOOTH EXTRACTION      There were no vitals filed for this visit.  Subjective Assessment - 08/21/18 1020    Subjective  I am on my cycle. Nothing has change in the pelivic floor. I used unscented soap on the vaginal area and I have had a reaction and will be seeing the doctor. No back pain.     Pertinent History  PT for urinary incontinence 2009.      Diagnostic tests  x-rays of spine degenerative changes    Patient Stated Goals  not worry about urinary leakage, decreased pain with intercourse    Currently in Pain?  Yes    Pain Score  8     Pain Location  Vagina    Pain Orientation  Mid    Pain Descriptors / Indicators  Spasm    Pain Type  Chronic pain    Pain Onset  More than a month ago    Pain Frequency   Intermittent    Aggravating Factors   penile penetration    Pain Relieving Factors  no penile penetration    Multiple Pain Sites  No         [facility] PT Assessment - 08/21/18 0001      Assessment   Medical Diagnosis  N393.3 Stress incontinence of urine/lumbar radiculopathy    Referring Provider (PT)  Dr. 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      Cognition   Overall Cognitive Status  Within Functional Limits  for tasks assessed      Posture/Postural Control   Posture/Postural Control  Postural limitations    Postural Limitations  Rounded Shoulders;Forward head;Decreased lumbar lordosis      ROM / Strength   AROM / PROM / Strength  AROM;PROM;Strength      AROM   Lumbar Extension  decreased by 25%    Lumbar - Right Side Bend  full      PROM   Right Hip External Rotation   65    Left Hip External Rotation   65      Strength   Right Hip Extension  4-/5    Right Hip ABduction  4+/5    Left Hip Extension  4/5    Left Hip ABduction  4+/5      Palpation   SI assessment   pelvis in correct alignement                Pelvic Floor Special Questions - 08/21/18 0001    Currently Sexually Active  Yes    Is this Painful  Yes    Marinoff Scale  discomfort that does not affect completion    Urinary Leakage  No    Fecal incontinence  No    Exam Type  Deferred   had allergic reaction to soap   Strength  weak squeeze, no lift        [facility] Adult PT Treatment/Exercise - 08/21/18 0001      Lumbar Exercises: Stretches   Double Knee to Chest Stretch  --   10times with feet on ball   Lower  Trunk Rotation  --   15 reps with feel on ball   Other Lumbar Stretch Exercise  supine with feet on the ball and move knees to chest stretch inner thighs with breath    Other Lumbar Stretch Exercise  happy baby pose      Lumbar Exercises: Standing   Other Standing Lumbar Exercises  wall pushups with core strength       Lumbar Exercises: Seated   Sit to Stand  10 reps   VC on lumbar extension, no hands   Other Seated Lumbar Exercises  alternate shoulder and hip flexion with abdominal bracing      Lumbar Exercises: Supine   Clam  15 reps   with clam   Bent Knee Raise  20 reps   each side with verbal cues to perform correctly   Bridge  10 reps;1 second   feet on ball              PT Short Term Goals - 04/01/18 1238      PT SHORT TERM GOAL #3   Title  ability to contract the lower abdominal muscles so she will be able to contract the pelvic floor muscles in a circular fashion    Time  4    Period  Weeks    Status  Achieved      PT SHORT TERM GOAL #4   Title  improved left hip ER to >/= 60 degrees passively    Time  4    Period  Weeks    Status  Achieved        PT Long Term Goals - 08/21/18 1030      PT LONG TERM GOAL #1   Title  I with advanced HEP for core stability     Baseline  still learning    Time  4    Period  Months  Status  On-going    Target Date  12/21/18      PT LONG TERM GOAL #2   Title  report =/> 75% reduction of Lt LE symptoms with walking ( 02/05/18)     Baseline  --    Time  12    Period  Weeks    Status  Achieved      PT LONG TERM GOAL #4   Title  maintain level pelvic, not Lt ilium rotation =/> 1 wk (11/02/17)     Time  6    Period  Weeks    Status  Achieved      PT LONG TERM GOAL #5   Title  demo bilat hip strength =/> 5-/5 to allow her to start a walking program (02/05/18)     Time  4    Period  Months    Status  On-going    Target Date  12/21/18      PT LONG TERM GOAL #6   Title  pain with intercourse decreased >/= 50% due  to ability to relax the pelvic floor and understand vaginal health    Baseline  initial penetration  and deeper is 40%     Time  4    Period  Months    Status  On-going    Target Date  12/21/18      PT LONG TERM GOAL #7   Title  urinary leakage decreased >/= 75% due to improve circular contraction of pelvic floor and increased strength    Time  8    Period  Weeks    Status  Achieved      PT LONG TERM GOAL #8   Title  understand how to use moisturizers and lubricants for the vaginal area for good vaginal health    Time  8    Period  Weeks    Status  Achieved      PT LONG TERM GOAL  #9   TITLE  urge to void decreased >/= 75% due to ability to relax the pelvic floor muscles.    Baseline  improved by 20%    Time  4    Period  Months    Status  On-going    Target Date  12/21/18            Plan - 08/21/18 1034    Clinical Impression Statement  Patient had a vaginal exam and MD noticed it was easier to perform the exam due to improved tissue mobility. Patient reports no back pain. Patient has full lumbar  ROM  except for extension limited by 25%.  Patient reports pain with intercourse is 9/10 but initial and deep penetration decreased by 40%. No urinary leakage. Patient reports the urge to void is 20% better. Patient is using the first size dilator with pain level 3/10 and understand how to progress. Patient is ready for the next size dilator. Patient has weakness in the hips and pelvic floor. Patient will benefit from skilled therapy to improve elongation fo vaginal muscles for penile penetration and vaginal exams.     Rehab Potential  Excellent    Clinical Impairments Affecting Rehab Potential  None    PT Frequency  Monthy   2 times per month   PT Duration  Other (comment)   4 months   PT Treatment/Interventions  Biofeedback;Cryotherapy;Electrical Stimulation;Moist Heat;Ultrasound;Therapeutic activities;Therapeutic exercise;Patient/family education;Neuromuscular  re-education;Manual techniques;Scar mobilization;Passive range of motion;Dry needling    PT Next Visit Plan  soft tissue work  internally and myofascial release    PT Home Exercise Plan  Access Code: [email or web address]     Recommended Other Services  MD renewal sent on 08/21/2018    Consulted and Agree with Plan of Care  Patient       Patient will benefit from skilled therapeutic intervention in order to improve the following deficits and impairments:  Pain, Increased fascial restricitons, Decreased coordination, Decreased mobility, Increased muscle spasms, Decreased scar mobility, Decreased endurance, Decreased range of motion, Decreased strength, Decreased activity tolerance  Visit Diagnosis: Muscle weakness (generalized) - Plan: PT plan of care cert/re-cert  Cramp and spasm - Plan: PT plan of care cert/re-cert  Abnormal posture - Plan: PT plan of care cert/re-cert     Problem List Patient Active Problem List   Diagnosis Date Noted  . Spondylosis of lumbosacral region without myelopathy or radiculopathy 09/07/2017  . Generalized abdominal pain 10/11/2016  . Dyspareunia in female 08/25/2015  . Irregular menses 04/16/2013  . Vitamin D deficiency 04/16/2013  . H/O: hypothyroidism 11/14/2012    Earlie Counts, PT 08/21/18 10:56 AM   [facility] [address], [place], [place], [address] Phone: [phone]   Fax:  [phone]  Name: Jamie Miller MRN: [id number] Date of Birth: [birth year]

## 2018-08-21 NOTE — Progress Notes (Signed)
GYNECOLOGY  VISIT   HPI: 47 y.o.   Married White or Caucasian Not Hispanic or Latino  female   [phone] with Patient's last menstrual period was 05/25/2018 (exact date).   here for vaginal irritation and itching that began Monday morning. She has been using a steroid cream which has helped some. No increase in vaginal d/c, no odor. Some external dysuria only. She thinks she may be irritated from a new soap.   GYNECOLOGIC HISTORY: Patient's last menstrual period was 05/25/2018 (exact date). Contraception: Spouse has had a vasectomy Menopausal hormone therapy: None       OB History    Gravida  1   Para  1   Term      Preterm  1   AB      Living  1     SAB      TAB      Ectopic      Multiple      Live Births  1              Patient Active Problem List   Diagnosis Date Noted  . Spondylosis of lumbosacral region without myelopathy or radiculopathy 09/07/2017  . Generalized abdominal pain 10/11/2016  . Dyspareunia in female 08/25/2015  . Irregular menses 04/16/2013  . Vitamin D deficiency 04/16/2013  . H/O: hypothyroidism 11/14/2012    Past Medical History:  Diagnosis Date  . Allergy   . Anxiety   . Asthma   . Bladder spasms 10/2017   doing pelvic floor PT  . Chicken pox   . OAB (overactive bladder)   . Thyroid disease    was on low dose synthroid at one time, but removed and levels have been "normal" since and followed thorugh GYN  . Vitamin D deficiency     Past Surgical History:  Procedure Laterality Date  . APPENDECTOMY  1990  . BREAST BIOPSY  2000   biopsy  . CERVICAL CERCLAGE  2009  . HYMENECTOMY  2005  . WISDOM TOOTH EXTRACTION      Current Outpatient Medications  Medication Sig Dispense Refill  . cholecalciferol (VITAMIN D) 1000 units tablet Take 2,000 Units by mouth daily.    Marland Kitchen loratadine (CLARITIN) 10 MG tablet Take 10 mg by mouth daily.    . MULTIPLE VITAMIN PO Take 1 tablet by mouth daily.    . Probiotic Product (ACIDOPHILUS/GOAT  MILK) CAPS Take by mouth.    . vitamin E 200 UNIT capsule Take 200 Units by mouth daily.     No current facility-administered medications for this visit.      ALLERGIES: Dairy aid  [lactase]; Eggs or egg-derived products; Hemp seed oil  [dronabinol]; Levothyroxine; Metronidazole; Nitrofurantoin monohyd macro; Strawberry (diagnostic); Wheat bran; Almond oil; Keflex [cephalexin]; Nickel; Other; Clindamycin; Clindamycin hcl; and Cranberry  Family History  Problem Relation Age of Onset  . Hyperlipidemia Mother   . Diabetes Father   . Prostate cancer Father        prostate  . Prostate cancer Maternal Grandfather        prostate  . Prostate cancer Paternal Grandfather        prostate  . Arthritis Maternal Grandmother   . Lung cancer Paternal Grandmother     Social History   Socioeconomic History  . Marital status: Married    Spouse name: Richardson Landry  . Number of children: 1  . Years of education: Not on file  . Highest education level: Not on file  Occupational History  .  Not on file  Social Needs  . Financial resource strain: Not on file  . Food insecurity:    Worry: Not on file    Inability: Not on file  . Transportation needs:    Medical: Not on file    Non-medical: Not on file  Tobacco Use  . Smoking status: Never Smoker  . Smokeless tobacco: Never Used  Substance and Sexual Activity  . Alcohol use: No  . Drug use: No  . Sexual activity: Yes    Partners: Male    Birth control/protection: Other-see comments    Comment: Husband had vasectomy  Lifestyle  . Physical activity:    Days per week: Not on file    Minutes per session: Not on file  . Stress: Not on file  Relationships  . Social connections:    Talks on phone: Not on file    Gets together: Not on file    Attends religious service: Not on file    Active member of club or organization: Not on file    Attends meetings of clubs or organizations: Not on file    Relationship status: Not on file  . Intimate  partner violence:    Fear of current or ex partner: Not on file    Emotionally abused: Not on file    Physically abused: Not on file    Forced sexual activity: Not on file  Other Topics Concern  . Not on file  Social History Narrative   Married to [place]. Has a daughter Colletta [place].    College degree.    Drinks caffeine. Uses herbal remedies. Takes a daily vitamin.    Wears her seatbelt   Smoke detector in the home.    Feels safe in her relationships.     Review of Systems  Constitutional: Negative.   HENT: Negative.   Eyes: Negative.   Respiratory: Negative.   Cardiovascular: Negative.   Gastrointestinal: Negative.   Genitourinary:       Vaginal irritation Vaginal itching  Musculoskeletal: Negative.   Skin: Negative.   Neurological: Negative.   Endo/Heme/Allergies: Negative.   Psychiatric/Behavioral: Negative.     PHYSICAL EXAMINATION:    BP 122/82 (BP Location: Right Arm, Patient Position: Sitting, Cuff Size: Normal)   Pulse 76   Wt 162 lb 12.8 oz (73.8 kg)   LMP 05/25/2018 (Exact Date)   BMI 28.84 kg/m     General appearance: alert, cooperative and appears stated age  Pelvic: External genitalia:  no lesions, mild erythema              Urethra:  normal appearing urethra with no masses, tenderness or lesions              Bartholins and Skenes: normal                 Vagina: slightly erythematous appearing vagina with a thick, clumpy, white/yellow vaginal discharge, no lesions              Cervix: no lesions  Chaperone was present for exam.  Wet prep: ? clue, no trich, ++ wbc KOH: +++ yeast PH: 4.5   ASSESSMENT Vulvovaginits, Yeast on slides, ? Of BV on the vaginal slides as well, pH is slightly elevated    PLAN Affirm sent to r/o BV Treat with diflucan Continue with her OTC steroid ointment (feels it is helping) Discussed vulvar skin care   An After Visit Summary was printed and given to the patient.  CC: Dr Sabra Heck

## 2018-08-21 NOTE — Patient Instructions (Signed)
Vaginal Yeast infection, Adult    Vaginal yeast infection is a condition that causes vaginal discharge as well as soreness, swelling, and redness (inflammation) of the vagina. This is a common condition. Some women get this infection frequently.  What are the causes?  This condition is caused by a change in the normal balance of the yeast (candida) and bacteria that live in the vagina. This change causes an overgrowth of yeast, which causes the inflammation.  What increases the risk?  The condition is more likely to develop in women who:   Take antibiotic medicines.   Have diabetes.   Take birth control pills.   Are pregnant.   Douche often.   Have a weak body defense system (immune system).   Have been taking steroid medicines for a long time.   Frequently wear tight clothing.  What are the signs or symptoms?  Symptoms of this condition include:   White, thick, creamy vaginal discharge.   Swelling, itching, redness, and irritation of the vagina. The lips of the vagina (vulva) may be affected as well.   Pain or a burning feeling while urinating.   Pain during sex.  How is this diagnosed?  This condition is diagnosed based on:   Your medical history.   A physical exam.   A pelvic exam. Your health care provider will examine a sample of your vaginal discharge under a microscope. Your health care provider may send this sample for testing to confirm the diagnosis.  How is this treated?  This condition is treated with medicine. Medicines may be over-the-counter or prescription. You may be told to use one or more of the following:   Medicine that is taken by mouth (orally).   Medicine that is applied as a cream (topically).   Medicine that is inserted directly into the vagina (suppository).  Follow these instructions at home:    Lifestyle   Do not have sex until your health care provider approves. Tell your sex partner that you have a yeast infection. That person should go to his or her health care  provider and ask if they should also be treated.   Do not wear tight clothes, such as pantyhose or tight pants.   Wear breathable cotton underwear.  General instructions   Take or apply over-the-counter and prescription medicines only as told by your health care provider.   Eat more yogurt. This may help to keep your yeast infection from returning.   Do not use tampons until your health care provider approves.   Try taking a sitz bath to help with discomfort. This is a warm water bath that is taken while you are sitting down. The water should only come up to your hips and should cover your buttocks. Do this 3-4 times per day or as told by your health care provider.   Do not douche.   If you have diabetes, keep your blood sugar levels under control.   Keep all follow-up visits as told by your health care provider. This is important.  Contact a health care provider if:   You have a fever.   Your symptoms go away and then return.   Your symptoms do not get better with treatment.   Your symptoms get worse.   You have new symptoms.   You develop blisters in or around your vagina.   You have blood coming from your vagina and it is not your menstrual period.   You develop pain in your abdomen.  Summary     Vaginal yeast infection is a condition that causes discharge as well as soreness, swelling, and redness (inflammation) of the vagina.   This condition is treated with medicine. Medicines may be over-the-counter or prescription.   Take or apply over-the-counter and prescription medicines only as told by your health care provider.   Do not douche. Do not have sex or use tampons until your health care provider approves.   Contact a health care provider if your symptoms do not get better with treatment or your symptoms go away and then return.  This information is not intended to replace advice given to you by your health care provider. Make sure you discuss any questions you have with your health care  provider.  Document Released: 05/31/2005 Document Revised: 01/07/2018 Document Reviewed: 01/07/2018  Elsevier Interactive Patient Education  2019 [employer]

## 2018-08-22 ENCOUNTER — Ambulatory Visit: Payer: Self-pay | Admitting: Obstetrics & Gynecology

## 2018-08-22 ENCOUNTER — Telehealth: Payer: Self-pay

## 2018-08-22 LAB — VAGINITIS/VAGINOSIS, DNA PROBE
Candida Species: POSITIVE — AB
GARDNERELLA VAGINALIS: POSITIVE — AB
Trichomonas vaginosis: NEGATIVE

## 2018-08-22 MED ORDER — TINIDAZOLE 500 MG PO TABS: 1.0000 g | ORAL_TABLET | Freq: Every day | ORAL | 0 refills | Status: DC

## 2018-08-22 NOTE — Telephone Encounter (Signed)
Spoke with patient. Results given. Patient verbalizes understanding. States that she is unable to take Metronidazole due to cramping she has had with the medication before. Unsure if she took oral Flagyl or if this occurred with Metrogel. Asking for alternatives.  Reviewed with Dr.Jertson who states that the patient may have Tinidazole 1 gram orally once daily for 5 days.  Spoke with patient who is agreeable. Rx for Tinidazole sent to pharmacy on file. Patient verbalizes understanding.  Encounter closed.

## 2018-08-22 NOTE — Telephone Encounter (Signed)
Left message to call Kaitlyn at [phone]. 

## 2018-08-22 NOTE — Telephone Encounter (Signed)
-----   Message from Salvadore Dom, MD sent at 08/22/2018  8:29 AM EST ----- The patient was treated for yeast yesterday. Please inform the patient that her vaginitis probe was also + for BV and treat with flagyl (either oral or vaginal, her choice), no ETOH while on Flagyl.  Oral: Flagyl 500 mg BID x 7 days, or Vaginal: Metrogel, 1 applicator per vagina q day x 5 days.

## 2018-08-26 ENCOUNTER — Encounter: Payer: 59 | Admitting: Physical Therapy

## 2018-08-29 ENCOUNTER — Encounter: Payer: Self-pay | Admitting: Obstetrics & Gynecology

## 2018-08-29 ENCOUNTER — Telehealth: Payer: Self-pay | Admitting: *Deleted

## 2018-08-29 NOTE — Telephone Encounter (Signed)
Hello, Dr. Sabra Heck,    My symptoms have improved a lot since talking to [facility] and seeing Dr. Talbert Nan but some of the symptoms remain and wanted to see if I need to give it more time.    I finished the Tinidazole for bacteria on Monday night and the Fluconazole for yeast this Wednesday night. Dr. Talbert Nan advised to stop the hydrocodone cream after a week which was this Monday. I did.  I occasionally have itching still, sometimes the area, especially the lip area is redder/pinker than usual and seems there is a little more moist there than normal.   It seems my cycle started yesterday (the 25th) but it is very, very light.    Thank you very much,  Jamie Miller

## 2018-08-29 NOTE — Telephone Encounter (Signed)
Call to patient.  Sx improved but not fully resolved.  Last Diflucan Sunday night.  Discussed that medication continues to work.  Patient states that she is concerned about ongoing symptoms.   Office visit offered and patient accepts for tomorrow 08/30/18 with Dr. Sabra Heck.   Encounter closed.

## 2018-08-30 ENCOUNTER — Encounter: Payer: Self-pay | Admitting: Obstetrics & Gynecology

## 2018-08-30 ENCOUNTER — Ambulatory Visit (INDEPENDENT_AMBULATORY_CARE_PROVIDER_SITE_OTHER): Payer: 59 | Admitting: Obstetrics & Gynecology

## 2018-08-30 VITALS — BP 118/82 | HR 80 | Resp 14 | Ht 63.0 in | Wt 161.6 lb

## 2018-08-30 DIAGNOSIS — N898 Other specified noninflammatory disorders of vagina: Secondary | ICD-10-CM | POA: Diagnosis not present

## 2018-08-30 MED ORDER — TERCONAZOLE 0.4 % VA CREA: 1.0000 | TOPICAL_CREAM | Freq: Every day | VAGINAL | 0 refills | Status: DC

## 2018-08-30 NOTE — Progress Notes (Signed)
GYNECOLOGY  VISIT  CC:   Continued vaginitis  HPI: 47 y.o. G76P0101 Married White or Caucasian female here for Vaginal irritation.  She is having a little vaginal itching with increased moisture.  She feels like the toilet paper is sticking to her.  Last week, when she was seen, she was having so much discomfort, she was almost unable to sit down.  So, her symptoms are much better but not resolved.    Cycle started on 08/28/18 and is still present today but very light.  Flow has been light this entire cycle.    GYNECOLOGIC HISTORY: Patient's last menstrual period was 08/28/2018. Contraception: Vasectomy  Menopausal hormone therapy: none  Patient Active Problem List   Diagnosis Date Noted  . Spondylosis of lumbosacral region without myelopathy or radiculopathy 09/07/2017  . Generalized abdominal pain 10/11/2016  . Dyspareunia in female 08/25/2015  . Irregular menses 04/16/2013  . Vitamin D deficiency 04/16/2013  . H/O: hypothyroidism 11/14/2012    Past Medical History:  Diagnosis Date  . Allergy   . Anxiety   . Asthma   . Bladder spasms 10/2017   doing pelvic floor PT  . Chicken pox   . OAB (overactive bladder)   . Thyroid disease    was on low dose synthroid at one time, but removed and levels have been "normal" since and followed thorugh GYN  . Vitamin D deficiency     Past Surgical History:  Procedure Laterality Date  . APPENDECTOMY  1990  . BREAST BIOPSY  2000   biopsy  . CERVICAL CERCLAGE  2009  . HYMENECTOMY  2005  . WISDOM TOOTH EXTRACTION      MEDS:   Current Outpatient Medications on File Prior to Visit  Medication Sig Dispense Refill  . cholecalciferol (VITAMIN D) 1000 units tablet Take 2,000 Units by mouth daily.    Marland Kitchen loratadine (CLARITIN) 10 MG tablet Take 10 mg by mouth daily.    . Probiotic Product (ACIDOPHILUS/GOAT MILK) CAPS Take by mouth.    . MULTIPLE VITAMIN PO Take 1 tablet by mouth daily.    . vitamin E 200 UNIT capsule Take 200 Units by mouth  daily.     No current facility-administered medications on file prior to visit.     ALLERGIES: Dairy aid  [lactase]; Eggs or egg-derived products; Hemp seed oil  [dronabinol]; Levothyroxine; Metronidazole; Nitrofurantoin monohyd macro; Strawberry (diagnostic); Wheat bran; Almond oil; Keflex [cephalexin]; Nickel; Other; Clindamycin; Clindamycin hcl; and Cranberry  Family History  Problem Relation Age of Onset  . Hyperlipidemia Mother   . Diabetes Father   . Prostate cancer Father        prostate  . Prostate cancer Maternal Grandfather        prostate  . Prostate cancer Paternal Grandfather        prostate  . Arthritis Maternal Grandmother   . Lung cancer Paternal Grandmother     SH:  Married, non smoker  Review of Systems  Genitourinary:       Vaginal irritation  All other systems reviewed and are negative.   PHYSICAL EXAMINATION:    BP 118/82   Pulse 80   Resp 14   Ht 5\' 3"  (1.6 m)   Wt 161 lb 9.6 oz (73.3 kg)   LMP 08/28/2018   BMI 28.63 kg/m     General appearance: alert, cooperative and appears stated age Lymph:  no inguinal LAD noted  Pelvic: External genitalia:  no lesions  Urethra:  normal appearing urethra with no masses, tenderness or lesions              Bartholins and Skenes: normal                 Vagina: mild erythema of vaginal mucosa with watery discharge, no odor              Cervix: no lesions              Bimanual Exam:  Uterus:  normal size, contour, position, consistency, mobility, non-tender              Adnexa: no mass, fullness, tenderness  Chaperone was present for exam.  Assessment: Vaginal irritation with vaginal discharge  Plan: Affirm obtained today Terazol 7, one applicator nightly x 7 days.

## 2018-08-31 LAB — VAGINITIS/VAGINOSIS, DNA PROBE
Candida Species: NEGATIVE
Gardnerella vaginalis: POSITIVE — AB
Trichomonas vaginosis: NEGATIVE

## 2018-09-02 ENCOUNTER — Other Ambulatory Visit: Payer: Self-pay | Admitting: Obstetrics & Gynecology

## 2018-09-02 MED ORDER — TINIDAZOLE 500 MG PO TABS: 1.0000 g | ORAL_TABLET | Freq: Every day | ORAL | 0 refills | Status: DC

## 2018-09-06 ENCOUNTER — Telehealth: Payer: Self-pay | Admitting: Obstetrics & Gynecology

## 2018-09-06 ENCOUNTER — Encounter: Payer: Self-pay | Admitting: Obstetrics & Gynecology

## 2018-09-06 NOTE — Telephone Encounter (Signed)
Spoke with patient.  Reviewed symptoms as per note below.  Advised per last result note from Dr. Sabra Heck if still having symptoms needs office visit for evaluation.  09/09/2018 office visit scheduled with Dr. Sabra Heck.   Nursing advise offered: keep area clean and dry. Change Damp clothes as soon as possible. White cotton underwear, loose comfortable clothing. Can also try Aveeno Oatmeal Sitz Baths or plain water sitz baths for relief of external irritation.  Complete Tindamax as directed.  Patient agreeable to plan. Will follow up as scheduled with Dr. Sabra Heck.  Encounter to Dr. Sabra Heck and will close.

## 2018-09-06 NOTE — Telephone Encounter (Signed)
Hi Dr. Sabra Heck,    Tonight will be my last dose of Tinidazole and with the upcoming weekend, wanted to touch base that the symptoms are still there, perhaps a little less than when I came in. They aren't bothering me, I just notice that the area is still a little red, pinker than normal, still more moist than normal, and tiny toilet paper pieces stick to the area.     At one point, [facility] with Dr. Talbert Nan said sometimes boric acid is used for maintenance, but not sure if that is what's going on with me.    Thanks very much,  Jamie Miller

## 2018-09-09 ENCOUNTER — Ambulatory Visit (INDEPENDENT_AMBULATORY_CARE_PROVIDER_SITE_OTHER): Payer: 59 | Admitting: Obstetrics & Gynecology

## 2018-09-09 ENCOUNTER — Encounter: Payer: Self-pay | Admitting: Obstetrics & Gynecology

## 2018-09-09 VITALS — BP 110/72 | HR 66 | Resp 14 | Ht 63.0 in | Wt 162.0 lb

## 2018-09-09 DIAGNOSIS — N760 Acute vaginitis: Secondary | ICD-10-CM | POA: Diagnosis not present

## 2018-09-09 NOTE — Progress Notes (Signed)
GYNECOLOGY  VISIT  CC:   Follow up from vaginitis  HPI: 48 y.o. G77P0101 Married White or Caucasian female here for complaint of continues vaginal symptoms.  She states that she finished her medication and is still having vaginal moistness and slight itch she is also states that toilet paper is sticking to her.  She does use a mirror to look and does see redness to the skin.  Feels like the medication helps while she is on it but almost as soon as she stops it, she starts to notice symptoms again.  She did have a cycle 08/28/18.  This was light and short.  Denies urinary issues, pelvic pain, or fever.    She denies sensation of internal dryness.  She did feel the terazol helped and we discussed this may be due to the cream and that hormonal changes may be contributing to this.  So, some estrogen therapy could be helpful.    GYNECOLOGIC HISTORY: Patient's last menstrual period was 08/28/2018. Contraception: none Menopausal hormone therapy: none  Patient Active Problem List   Diagnosis Date Noted  . Spondylosis of lumbosacral region without myelopathy or radiculopathy 09/07/2017  . Generalized abdominal pain 10/11/2016  . Dyspareunia in female 08/25/2015  . Irregular menses 04/16/2013  . Vitamin D deficiency 04/16/2013  . H/O: hypothyroidism 11/14/2012    Past Medical History:  Diagnosis Date  . Allergy   . Anxiety   . Asthma   . Bladder spasms 10/2017   doing pelvic floor PT  . Chicken pox   . OAB (overactive bladder)   . Thyroid disease    was on low dose synthroid at one time, but removed and levels have been "normal" since and followed thorugh GYN  . Vitamin D deficiency     Past Surgical History:  Procedure Laterality Date  . APPENDECTOMY  1990  . BREAST BIOPSY  2000   biopsy  . CERVICAL CERCLAGE  2009  . HYMENECTOMY  2005  . WISDOM TOOTH EXTRACTION      MEDS:   Current Outpatient Medications on File Prior to Visit  Medication Sig Dispense Refill  .  cholecalciferol (VITAMIN D) 1000 units tablet Take 2,000 Units by mouth daily.    Marland Kitchen loratadine (CLARITIN) 10 MG tablet Take 10 mg by mouth daily.    . MULTIPLE VITAMIN PO Take 1 tablet by mouth daily.    . Probiotic Product (ACIDOPHILUS/GOAT MILK) CAPS Take by mouth.    . terconazole (TERAZOL 7) 0.4 % vaginal cream Place 1 applicator vaginally at bedtime. One applicator full QHS for seven days of therapy 45 g 0  . tinidazole (TINDAMAX) 500 MG tablet Take 2 tablets (1,000 mg total) by mouth daily. Complete five days of treatment. 10 tablet 0  . vitamin E 200 UNIT capsule Take 200 Units by mouth daily.     No current facility-administered medications on file prior to visit.     ALLERGIES: Dairy aid  [lactase]; Eggs or egg-derived products; Hemp seed oil  [dronabinol]; Levothyroxine; Metronidazole; Nitrofurantoin monohyd macro; Strawberry (diagnostic); Wheat bran; Almond oil; Keflex [cephalexin]; Nickel; Other; Clindamycin; Clindamycin hcl; and Cranberry  Family History  Problem Relation Age of Onset  . Hyperlipidemia Mother   . Diabetes Father   . Prostate cancer Father        prostate  . Prostate cancer Maternal Grandfather        prostate  . Prostate cancer Paternal Grandfather        prostate  . Arthritis Maternal Grandmother   .  Lung cancer Paternal Grandmother     SH:  Married, non smoker  Review of Systems  All other systems reviewed and are negative.   PHYSICAL EXAMINATION:    BP 110/72   Pulse 66   Resp 14   Ht 5\' 3"  (1.6 m)   Wt 162 lb (73.5 kg)   LMP 08/28/2018   BMI 28.70 kg/m     General appearance: alert, cooperative and appears stated age Lymph:  no inguinal LAD noted  Pelvic: External genitalia:  Erythema noted on inner aspect of labia majora              Urethra:  normal appearing urethra with no masses, tenderness or lesions              Bartholins and Skenes: normal                 Vagina: normal appearing vagina with normal color, sticky yellowish  discharge present, no lesions              Cervix: no lesions              Bimanual Exam:  Uterus:  normal size, contour, position, consistency, mobility, non-tender              Adnexa: no mass, fullness, tenderness  Chaperone was present for exam.  Assessment: Recurrent/persistent vaginitis Possible hormonal changes are contributing  Plan: nuswab obtained today.  Pt will use coconut oil until results are back.  If negative, will treat with some topical estrogen.  If positive, will need to consider longer therapy or treatment for recurrent BV.   ~15 minutes spent with patient >50% of time was in face to face discussion of above.  As this is recurrent/persistent, pt had additional questions and we did discuss recurrent BV treatment in light of her allergy/sensitivity to Metrogel.

## 2018-09-10 LAB — NUSWAB BV AND CANDIDA, NAA
Candida albicans, NAA: NEGATIVE
Candida glabrata, NAA: NEGATIVE

## 2018-09-11 ENCOUNTER — Ambulatory Visit: Payer: 59 | Admitting: Physical Therapy

## 2018-09-16 ENCOUNTER — Encounter: Payer: Self-pay | Admitting: Obstetrics & Gynecology

## 2018-09-16 ENCOUNTER — Telehealth: Payer: Self-pay | Admitting: Obstetrics & Gynecology

## 2018-09-16 MED ORDER — ESTRADIOL 0.1 MG/GM VA CREA: TOPICAL_CREAM | VAGINAL | 0 refills | Status: DC

## 2018-09-16 NOTE — Telephone Encounter (Signed)
Yes, ok to close encounter.  Thanks. 

## 2018-09-16 NOTE — Telephone Encounter (Signed)
Patient sent the following correspondence through [facility]. Routing to triage to assist patient with request.  Hello, Dr. Sabra Heck,    Now that there is no more yeast or bacteria, I wondered if my current symptoms are just natural with menopause. I have been using the coconut oil since my last appointment and the dryness is gone, although some toilet paper still sticks, it isn't as much. The constant wetness is still there along with the color a little more/less pink than before starting menopause. I am a little hesitant to use the prescribed cream as the last time I took hormones, I had unpleasant side effects.    If I continue just with the coconut oil and not the prescription, will I open myself up to possibly more bacteria/yeast?   The constant wetness and paper sticking symptoms aren't bothering me as they did, only when I think about them.    Thank you very much,  Jamie Miller

## 2018-09-16 NOTE — Telephone Encounter (Signed)
Patient returning call to [facility].

## 2018-09-16 NOTE — Telephone Encounter (Signed)
Message left to return call to Maryanne Huneycutt at [phone].    

## 2018-09-16 NOTE — Telephone Encounter (Signed)
Message left to return call to Brya Simerly at [phone].    

## 2018-09-16 NOTE — Telephone Encounter (Signed)
Spoke with patient in regards to her mychart message to Dr. Sabra Heck.   Advised can continue with coconut oil use if desires. Advised Dr. Sabra Heck did think that estrogen therapy cream would be helpful for her symptoms.    Discussed Vaginal estrogen use and how it works directly on the vaginal tissues to help with age related changes of the skin and discussed information from uptodate about low side effects due to low  Amount of estrogen systemically absorbed.   Patient accepts prescription for Estrace and instructions given on use.  Advised if not covered by insurance to please call back for a new Rx.   Follow up appointment scheduled for 11/08/2018.   Encounter to Dr. Sabra Heck.  Okay to close?

## 2018-09-18 ENCOUNTER — Encounter: Payer: Self-pay | Admitting: Obstetrics & Gynecology

## 2018-09-25 ENCOUNTER — Encounter: Payer: 59 | Admitting: Physical Therapy

## 2018-10-09 ENCOUNTER — Ambulatory Visit: Payer: 59 | Attending: Family Medicine | Admitting: Physical Therapy

## 2018-10-09 ENCOUNTER — Encounter: Payer: Self-pay | Admitting: Physical Therapy

## 2018-10-09 DIAGNOSIS — R293 Abnormal posture: Secondary | ICD-10-CM | POA: Diagnosis present

## 2018-10-09 DIAGNOSIS — R252 Cramp and spasm: Secondary | ICD-10-CM | POA: Insufficient documentation

## 2018-10-09 DIAGNOSIS — M6281 Muscle weakness (generalized): Secondary | ICD-10-CM | POA: Diagnosis present

## 2018-10-09 NOTE — Therapy (Addendum)
[facility] [address], [place], [place], [address] Phone: [phone]   Fax:  [phone]  Physical Therapy Treatment  Patient Details  Name: Jamie Miller MRN: [id number] Date of Birth: 1971/06/16 Referring Provider (PT): Dr. Howard Pouch   Encounter Date: 10/09/2018  PT End of Session - 10/09/18 0939    Visit Number  34    Date for PT Re-Evaluation  12/23/18    Authorization Type  [employer]    PT Start Time  0930    PT Stop Time  1010    PT Time Calculation (min)  40 min    Activity Tolerance  Patient tolerated treatment well;No increased pain    Behavior During Therapy  WFL for tasks assessed/performed       Past Medical History:  Diagnosis Date  . Allergy   . Anxiety   . Asthma   . Bladder spasms 10/2017   doing pelvic floor PT  . Chicken pox   . OAB (overactive bladder)   . Thyroid disease    was on low dose synthroid at one time, but removed and levels have been "normal" since and followed thorugh GYN  . Vitamin D deficiency     Past Surgical History:  Procedure Laterality Date  . APPENDECTOMY  1990  . BREAST BIOPSY  2000   biopsy  . CERVICAL CERCLAGE  2009  . HYMENECTOMY  2005  . WISDOM TOOTH EXTRACTION      There were no vitals filed for this visit.  Subjective Assessment - 10/09/18 0933    Subjective  No changes with the pelvic floor since last visit. I am at the smallest dilator.     Pertinent History  PT for urinary incontinence 2009.      Diagnostic tests  x-rays of spine degenerative changes    Patient Stated Goals  not worry about urinary leakage, decreased pain with intercourse    Currently in Pain?  Yes    Pain Score  8     Pain Location  Vagina    Pain Orientation  Mid    Pain Descriptors / Indicators  Spasm    Pain Onset  More than a month ago    Pain Frequency  Intermittent    Aggravating Factors   penile penetration    Pain Relieving Factors  no penile penetration    Multiple  Pain Sites  No                    Pelvic Floor Special Questions - 10/09/18 0001    Pelvic Floor Internal Exam  Patient cofirms identification and approves PT to assess muslce integrity and treatment    Exam Type  Vaginal    Strength  weak squeeze, no lift        [facility] Adult PT Treatment/Exercise - 10/09/18 0001      Self-Care   Self-Care  Other Self-Care Comments    Other Self-Care Comments   using the dilator regularly and to start today after the soft tissue work      Manual Therapy   Manual Therapy  Myofascial release;Internal Pelvic Floor    Myofascial Release  release of the perineum with pulling the pelvic floor downward    Internal Pelvic Floor  along the right sphincter where there is a ridge to flaten the tissue ; release of the obturator internist and along the posterior introitus; along the perineal body  PT Education - 10/09/18 1008    Education provided  Yes    Education Details  begin using the dilator again and the importance of using daily    Person(s) Educated  Patient    Methods  Explanation    Comprehension  Verbalized understanding       PT Short Term Goals - 04/01/18 1238      PT SHORT TERM GOAL #3   Title  ability to contract the lower abdominal muscles so she will be able to contract the pelvic floor muscles in a circular fashion    Time  4    Period  Weeks    Status  Achieved      PT SHORT TERM GOAL #4   Title  improved left hip ER to >/= 60 degrees passively    Time  4    Period  Weeks    Status  Achieved        PT Long Term Goals - 10/09/18 1012      PT LONG TERM GOAL #1   Title  I with advanced HEP for core stability     Time  4    Period  Months      PT LONG TERM GOAL #5   Title  demo bilat hip strength =/> 5-/5 to allow her to start a walking program (02/05/18)     Time  4    Period  Months    Status  On-going      PT LONG TERM GOAL #6   Title  pain with intercourse decreased >/= 50% due to  ability to relax the pelvic floor and understand vaginal health    Baseline  initial penetration  and deeper is 40% ; pain levelis 8/10    Time  4    Period  Months    Status  On-going      PT LONG TERM GOAL #7   Title  urinary leakage decreased >/= 75% due to improve circular contraction of pelvic floor and increased strength    Baseline  none    Time  8    Period  Weeks    Status  Achieved      PT LONG TERM GOAL  #9   TITLE  urge to void decreased >/= 75% due to ability to relax the pelvic floor muscles.    Baseline  improved by 20%    Time  4    Period  Months    Status  On-going            Plan - 10/09/18 0940    Clinical Impression Statement  Patient continues to be on the first dilator and is not using reguarly. Patient pain level with intercourse is 8/10. Patient is very anxious about some familty events which contributes to tightening the pelvic floor. Patient is not leaking urine. Patient is not having back pain. Patient will be working with weight watchers to reduce the weight. Pelvic floor strength is 2/5. Patient continues to have a tight band on the right introitus. The left pelvic floor muscles have increased mobility. Patient will benefit from skilled therapy to improve elongation for vaginal muscles for penile penetration and vaginal exams.     Rehab Potential  Excellent    Clinical Impairments Affecting Rehab Potential  None    PT Frequency  Monthy   2 times per month   PT Duration  Other (comment)   4 months   PT Treatment/Interventions  Biofeedback;Cryotherapy;Electrical Stimulation;Moist  Heat;Ultrasound;Therapeutic activities;Therapeutic exercise;Patient/family education;Neuromuscular re-education;Manual techniques;Scar mobilization;Passive range of motion;Dry needling    PT Next Visit Plan  soft tissue work  internally and myofascial release; check on using the dilator    PT Home Exercise Plan  Access Code: [email or web address]     Recommended Other Services  MD has  signed all the notes    Consulted and Agree with Plan of Care  Patient       Patient will benefit from skilled therapeutic intervention in order to improve the following deficits and impairments:  Pain, Increased fascial restricitons, Decreased coordination, Decreased mobility, Increased muscle spasms, Decreased scar mobility, Decreased endurance, Decreased range of motion, Decreased strength, Decreased activity tolerance  Visit Diagnosis: Muscle weakness (generalized)  Cramp and spasm  Abnormal posture     Problem List Patient Active Problem List   Diagnosis Date Noted  . Spondylosis of lumbosacral region without myelopathy or radiculopathy 09/07/2017  . Generalized abdominal pain 10/11/2016  . Dyspareunia in female 08/25/2015  . Irregular menses 04/16/2013  . Vitamin D deficiency 04/16/2013  . H/O: hypothyroidism 11/14/2012    Earlie Counts, PT 10/09/18 10:13 AM   [facility] [address] [place], [place], [address] Phone: [phone]   Fax:  [phone]  Name: BRYLYN NOVAKOVICH MRN: [id number] Date of Birth: 01/15/1971 PHYSICAL THERAPY DISCHARGE SUMMARY  Visits from Start of Care: 34  Current functional level related to goals / functional outcomes: See above. Unable to fully assess patient for discharge due to patient not able to return to therapy. Patient is not able to return due to financial reasons. Patient called on 10/22/2018 to be discharged and she will continue her program at home.    Remaining deficits: See above.    Education / Equipment: HEP; Vaginal dilators Plan: Patient agrees to discharge.  Patient goals were not met. Patient is being discharged due to financial reasons.  Thank you for the referral. Earlie Counts, PT 10/22/18 9:56 PM  ?????

## 2018-10-22 ENCOUNTER — Encounter: Payer: Self-pay | Admitting: Physical Therapy

## 2018-10-23 ENCOUNTER — Encounter: Payer: 59 | Admitting: Physical Therapy

## 2018-10-24 ENCOUNTER — Encounter: Payer: Self-pay | Admitting: Obstetrics & Gynecology

## 2018-10-24 ENCOUNTER — Telehealth: Payer: Self-pay | Admitting: Obstetrics & Gynecology

## 2018-10-24 NOTE — Telephone Encounter (Signed)
Routing to Dr. Miller to review.  

## 2018-10-24 NOTE — Telephone Encounter (Signed)
Message   Hello, Dr. Sabra Heck,    I wanted to send an update on my cycle. Had a very light one mid January-lasted two days, this was before I started taking the Estradiol cream. This Tuesday (18th) a cycle started again, but heavier than I have had in awhile, yesterday 3 pads, so far today 2 pads. Could the timing/heaviness of this cycle be related to the Estradiol? I've been experiencing occasional emotional lows, could that also be due to the cream? If yes, then perhaps I could tolerate dry, pinkish/reddish skin and stop the Estradiol to avoid this type of period and sad mood.    Please feel free to respond to this email, if that is easier.    Thanks very much,  Jamie Miller

## 2018-10-25 NOTE — Telephone Encounter (Signed)
She would be very sensitive to the estradiol cream to have these symptoms but it is possible.  So, she could stop it for the next two months and see if her symptoms improve.  If they do, I could have the estradiol cream compounded at [facility] and use a lower dosage to decrease side effects.  I can decrease the dosage in 1/2 or even to a 1/4 of what she is currently using.  So, that would be my recommendation--stop and see if symptoms improve.  She will need to give an update in a month or two and then we can change the medication dosage if still feels she needs the estradiol cream.

## 2018-10-25 NOTE — Telephone Encounter (Signed)
Spoke with patient, advised as seen below per Dr. Sabra Heck. Patient agreeable to plan. Rescheduled f/u OV to 4/2 at 11:15am with Dr. Sabra Heck.   Routing to provider for final review. Patient is agreeable to disposition. Will close encounter.

## 2018-11-06 ENCOUNTER — Encounter: Payer: 59 | Admitting: Physical Therapy

## 2018-11-08 ENCOUNTER — Ambulatory Visit: Payer: Self-pay | Admitting: Obstetrics & Gynecology

## 2018-11-20 ENCOUNTER — Encounter: Payer: 59 | Admitting: Physical Therapy

## 2018-11-27 ENCOUNTER — Telehealth: Payer: Self-pay | Admitting: Obstetrics & Gynecology

## 2018-11-27 NOTE — Telephone Encounter (Signed)
Left message to call Topaz Raglin at [phone]. 

## 2018-11-27 NOTE — Telephone Encounter (Signed)
Called and spoke with patient to reschedule her appointment for 12/04/18 with Dr. Sabra Heck for follow on vaginal cream. Patient cancelled and declined to reschedule at this time. She'd like a call back from the nurse to discuss her medication and she declined a virtual visit at this time.

## 2018-11-28 NOTE — Telephone Encounter (Signed)
Spoke with patient. Patient states that her recheck was for her vaginal estrogen cream. Patient discontinued use of vaginal estrogen cream on 10/24/2018 after speaking with Dr.Miller about side effects. Reports she is doing well without using the estrogen vaginal cream. Using coconut oil and is having no problems. Will continue to monitor and return call if anything changes. Encounter closed.

## 2018-11-28 NOTE — Telephone Encounter (Signed)
Patient returning call to triage nurse

## 2018-12-05 ENCOUNTER — Ambulatory Visit: Payer: Self-pay | Admitting: Obstetrics & Gynecology

## 2019-01-26 ENCOUNTER — Encounter: Payer: Self-pay | Admitting: Obstetrics & Gynecology

## 2019-01-28 ENCOUNTER — Other Ambulatory Visit: Payer: Self-pay | Admitting: Obstetrics & Gynecology

## 2019-01-28 ENCOUNTER — Telehealth: Payer: Self-pay | Admitting: *Deleted

## 2019-01-28 DIAGNOSIS — N926 Irregular menstruation, unspecified: Secondary | ICD-10-CM

## 2019-01-28 NOTE — Telephone Encounter (Signed)
It has been two years since I've done an endometrial biopsy.  Given the amount of irregular bleeding, I think we should repeat it at this time.  Ok to proceed with scheduling.  Order placed.

## 2019-01-28 NOTE — Telephone Encounter (Signed)
Hello, Dr. Sabra Heck,    I hope things are well with you. I recall you telling me to let you know when there was a change in my cycle. I realize it may not be predictable, since Feb. 2020 it has been 32 days, 27 days, 24 days, and today my period started 17 days since last one started. So I had one Feb. 18, March 20, April 15, May 8, and now May 24. Not sure if this is relevant, but my daughter (age 48) started her first period December 03 2018. Not sure if having two cycles in same household alters them somehow or if what I am experiencing is just menopause?    Thanks very much,  Jamie Miller

## 2019-01-28 NOTE — Telephone Encounter (Signed)
Routing to Dr. Sabra Heck to review MyChart message and advise.     Last PUS 04/04/2018: Intramural fibroid, 72mm Possible 41mm polyp  08/12/18: FSH 74.4,  estradiol 92.1

## 2019-01-29 NOTE — Telephone Encounter (Signed)
Spoke with patient, advised as seen below per Dr. Sabra Heck. Patient is menopausal, advised can scheudle at anytime. Patient declines to schedule at this time, would like to discuss with her husband and return call, "will likely schedule for 2 wks out". Advised patient order has been placed for precert, our office will return call to review benefits. Advised earlier OV needed if bleeding becomes heavy, changing saturated pad/tampon q2hrs or any new symptoms develop. Patient verbalizes understanding and is agreeable.   Routing to provider for final review. Patient is agreeable to disposition. Will close encounter.  Cc: Lerry Liner

## 2019-02-01 ENCOUNTER — Encounter: Payer: Self-pay | Admitting: Obstetrics & Gynecology

## 2019-02-03 ENCOUNTER — Telehealth: Payer: Self-pay | Admitting: Obstetrics & Gynecology

## 2019-02-03 NOTE — Telephone Encounter (Signed)
Spoke with patient. WebEx scheduled with Dr. Sabra Heck on 6/4 at 4:30pm. E-mail address on file confirmed.   Routing to provider for final review. Patient is agreeable to disposition. Will close encounter.

## 2019-02-03 NOTE — Telephone Encounter (Signed)
Patient sent the following correspondence through [place]. Routing to triage to assist patient with request.  Hi Dr. Sabra Heck,    Thank you for responding. The good news is that it seems to have been spotting more than an actual cycle. I only saw blood 2-3x over the weekend and never on a pad. Thank you for your suggestions. Yes, I would appreciate a video visit first to go over my questions and options. Thank you very much.  Jamie Miller  ----- Message -----  From: Megan Salon, MD  Sent: 02/02/2019 2:52 PM EDT  To: [name]  Subject: RE: Non-Urgent Medical Question  Mrs. Barbe,  I think it is worth reevaluating you with an ultrasound and a biopsy.     However, if you would desire restarting the micronor (progesterone only pill) first to see if this will help regulate your cycles, then we could start with this. I can send in a prescription for you.o    We can also do a video visit first and talk through all of this (without you coming into the office) before making any decisions.    I will be in the operating room all of tomorrow morning but could plan a video visit with you this week.    Just let me know. Thanks.    Edwinna Areola      ----- Message -----   From: [name]   Sent: 02/01/2019 1:45 PM EDT    To: Megan Salon, MD  Subject: RE: Non-Urgent Medical Question    Hi Dr. Sabra Heck,    Since the office is closed and there is a chance you are on call this weekend, I wanted to let you know the new news about my cycle. It seems to have ended on day 6 and starting a new cycle today, on day 8.    I emailed earlier this week about the 17 day cycle. It was lighter than my normal cycle. It ended on day 6. Day 7 was yesterday (May 29 and there was maybe a drop or two) Day 8 is today and it seems to have just started again, like it is a new cycle. I believe this has happened to me before during pre menopause. It isn't heavy right now. If it continues and I haven't heard from  you, I will call the doctor on call just in case? Not sure.    Thank you very much,  Jamie Miller

## 2019-02-06 ENCOUNTER — Ambulatory Visit (INDEPENDENT_AMBULATORY_CARE_PROVIDER_SITE_OTHER): Payer: 59 | Admitting: Obstetrics & Gynecology

## 2019-02-06 ENCOUNTER — Encounter: Payer: Self-pay | Admitting: Obstetrics & Gynecology

## 2019-02-06 ENCOUNTER — Other Ambulatory Visit: Payer: Self-pay

## 2019-02-06 DIAGNOSIS — N926 Irregular menstruation, unspecified: Secondary | ICD-10-CM

## 2019-02-06 NOTE — Progress Notes (Signed)
Virtual Visit via Telephone Note  I connected with Jamie Miller on 02/06/19 at  5:00 PM EDT by telephone and verified that I am speaking with the correct person using two identifiers.  Location: Patient: Home Provider: Office   I discussed the limitations, risks, security and privacy concerns of performing an evaluation and management service by telephone and the availability of in person appointments. I also discussed with the patient that there may be a patient responsible charge related to this service. The patient expressed understanding and agreed to proceed.   History of Present Illness: 48 G1P1 MWF who is having irregular bleeding.  Flow is not heavy.  She is not passing clots.  She is having a lot of stressors.  Daughter is home.  Currently, she is not bleeding.    Since February, her cycles have been 32 days, 27 days, 24 days, 17 days.  Her flow has not lasted longer than 7 days.  Flow has not really changed in the last six months.      Observations/Objective: No vitals or physical exam could be performed  Assessment and Plan: 48 yo MWF with irregular bleeding Probable endometrial polyp Uterine fibroid  Follow Up Instructions: For now, we will continue to monitor menstrual cycles. If length of bleeding or volume of bleeding increases, will restrart micronor. If that doesn't help cycles, consider repeating PUS and endometrial biopsy   The patient was advised to call back or seek an in-person evaluation if the symptoms worsen or if the condition fails to improve as anticipated.  I provided 16 minutes of non-face-to-face time during this encounter.   Megan Salon, MD

## 2019-03-02 ENCOUNTER — Encounter: Payer: Self-pay | Admitting: Obstetrics & Gynecology

## 2019-03-03 ENCOUNTER — Telehealth: Payer: Self-pay | Admitting: Obstetrics & Gynecology

## 2019-03-03 NOTE — Telephone Encounter (Signed)
No further recommendations.  Ok to close encounter.  Thanks.

## 2019-03-03 NOTE — Telephone Encounter (Signed)
Spoke with patient. She is complaining of vaginal discharge and vaginal irritation. This is different symptoms from her BV symptoms. She has used hydrocortisone cream which helped some. Patient began Monistat 7 cream last PM. She wasn't sure if Yeast or irritation from using different toilet paper, but since hydrocortisone didn't clear up completely began [place]. Back to her brand toilet paper now. Will continue with Monistat and call back if symptoms don't completely resolve. LMP 01-26-19 normal flow for 5-7 days. Routed to provider to see if she has any further recommendations, if not can close encounter.

## 2019-03-03 NOTE — Telephone Encounter (Signed)
Patient sent the following correspondence through [place]. Routing to triage to assist patient with request.  Hi Dr. Sabra Heck,    For the past several days I've had irritation and different discharge and would appreciate your opinion on treatment. It is similar to what I've had before so I've been using hydrocortisone cream and has helped change the skin color from red\hot pink to more normal. Sometimes TP sticks to skin and area is a little wetter than normal. Very little itching. No burning or foul smell. Discharge creamy, white kind of similar to my normal discharge color and only seen 1x a day.    I am planning on getting an over counter treatment for tonight's use (either Femiclear or Monistat). I haven't ever used those before. I'm hesitant not talking to you first but then I'd know if it was yeast or allergy reaction from toilet paper. I had to change toilet tissue for the past few weeks as there was none in stores and then noticed the red color of my skin. I now am back to my normal brand. My last cycle was 36 days ago, May 24.    Thank you,  Jamie Miller

## 2019-03-05 ENCOUNTER — Encounter: Payer: Self-pay | Admitting: Obstetrics & Gynecology

## 2019-03-05 NOTE — Telephone Encounter (Signed)
Patient sent the following correspondence through [place]. Routing to triage to assist patient with request.  Good morning, Dr. Sabra Heck,    I wanted to give an update on my symptoms. I believe I am going to continue taking the Monistat 7 as there is improvement in symptoms. I've taken 3 of the 7 doses of Monistat 7. So far the moisture has improved and the itchiness. I still see discharge but it is a little less than it has been. The only thing that has stayed the same is the pinkish/red color.     Feel free to email back if I need to do something else. Thank you so much.  Christella Noa

## 2019-03-05 NOTE — Telephone Encounter (Signed)
Spoke with patient. Vaginitis symptoms are better. She states still has some pinkish/redness of vulvar area but much less itching. Patient has taken 3 days of Monistat 7 and advised she may have to complete 7 days for symptoms to totally resolve. She can apply some of the cream to vulva area and see if this helps with redness. But overall, she feels much better. She will call back if not totally resolved and knows she will need ov at that point. Thanked me for returning her message.

## 2019-03-11 IMAGING — MG DIGITAL SCREENING BILATERAL MAMMOGRAM WITH TOMO AND CAD
6 of 10 series · 6 of 30 positions shown · non-contrast
Comparison: Previous exam(s).

CLINICAL DATA: Screening.

EXAM:
DIGITAL SCREENING BILATERAL MAMMOGRAM WITH TOMO AND CAD

[R CC synth-2D]
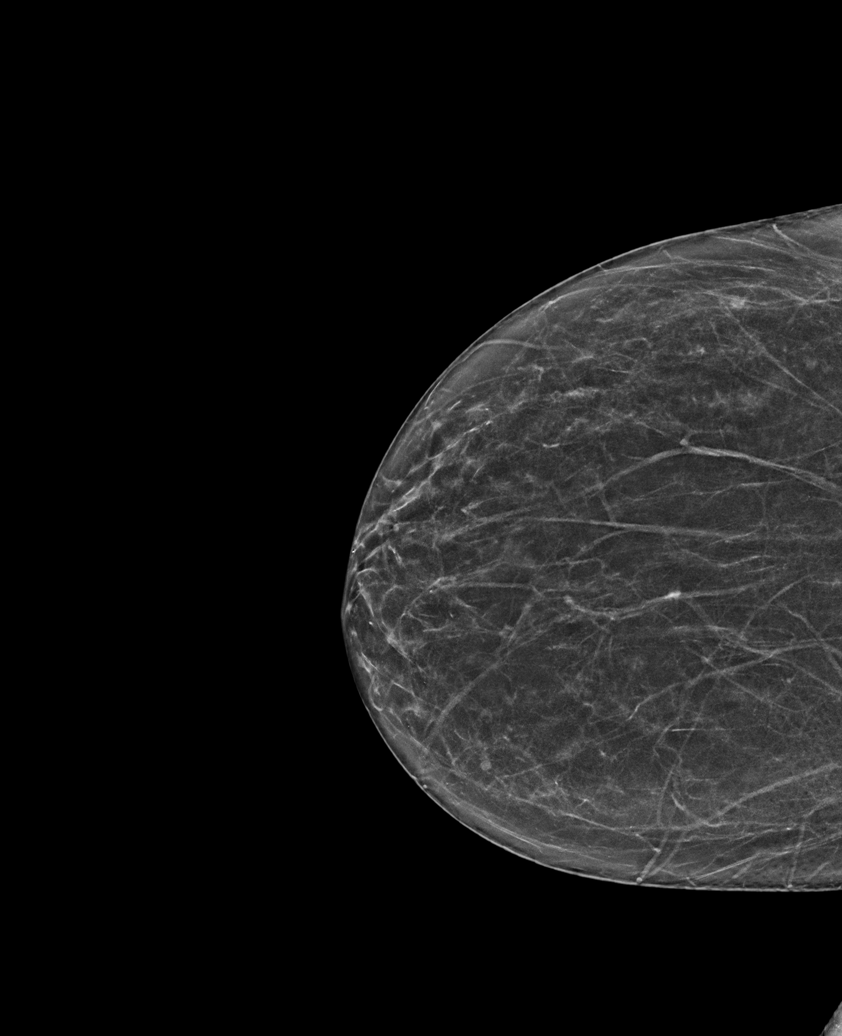

[L CC synth-2D]
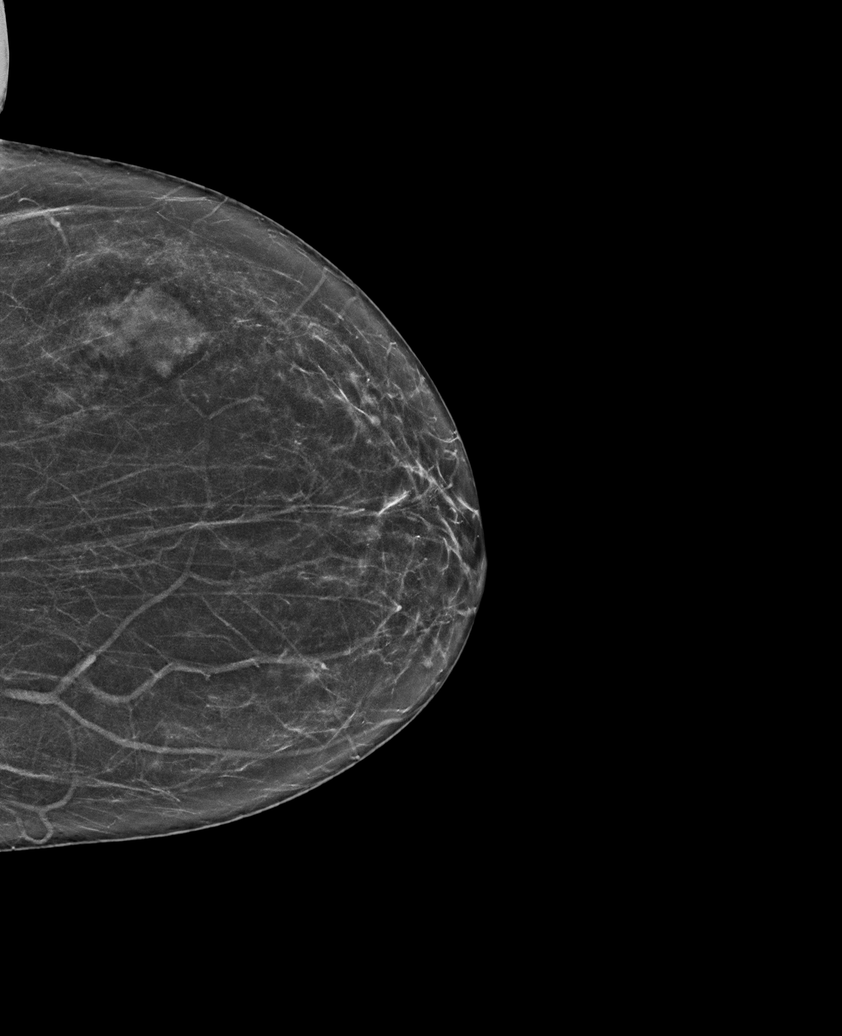

[L MLO synth-2D (1 of 2)]
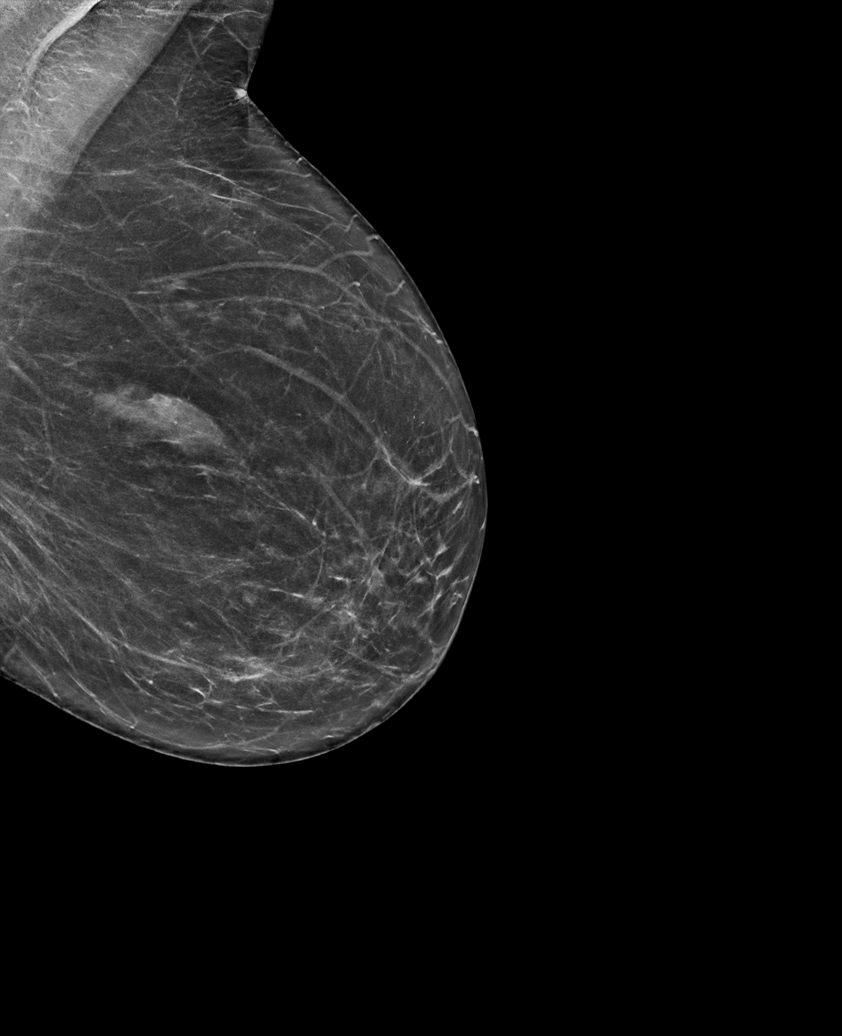

[R MLO synth-2D]
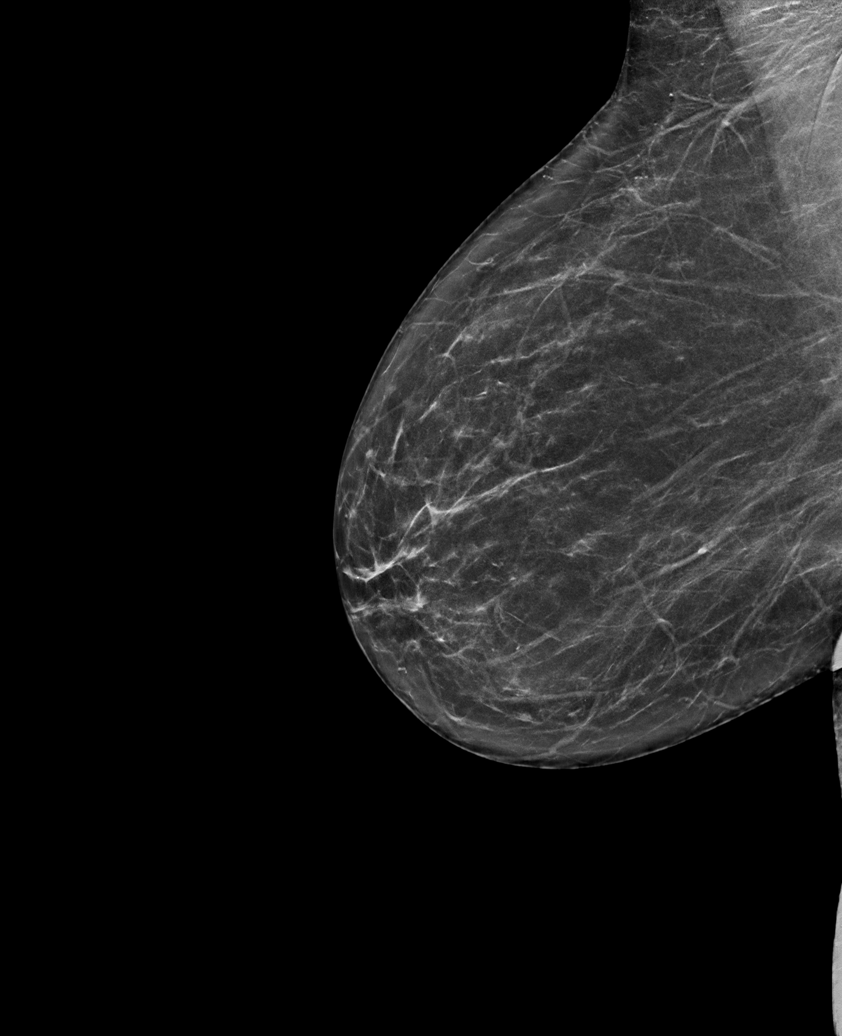

[L MLO synth-2D (2 of 2)]
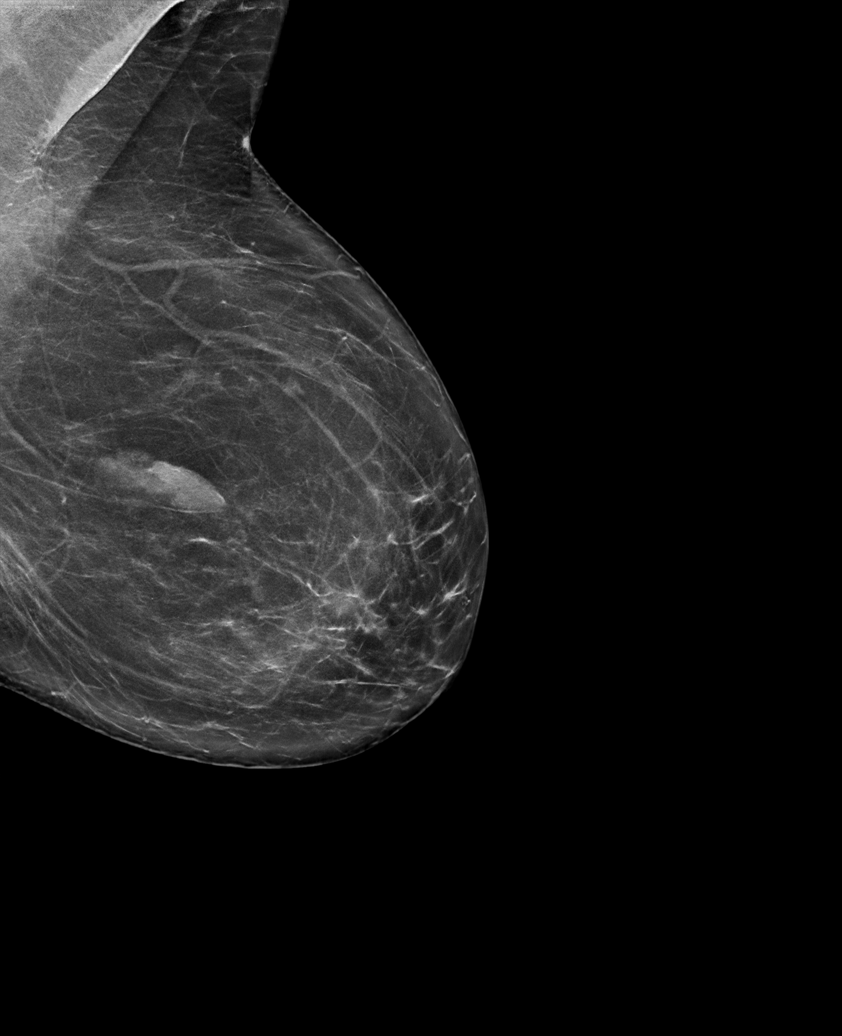

[L MLO tomo · tomo slice 41/80.0]
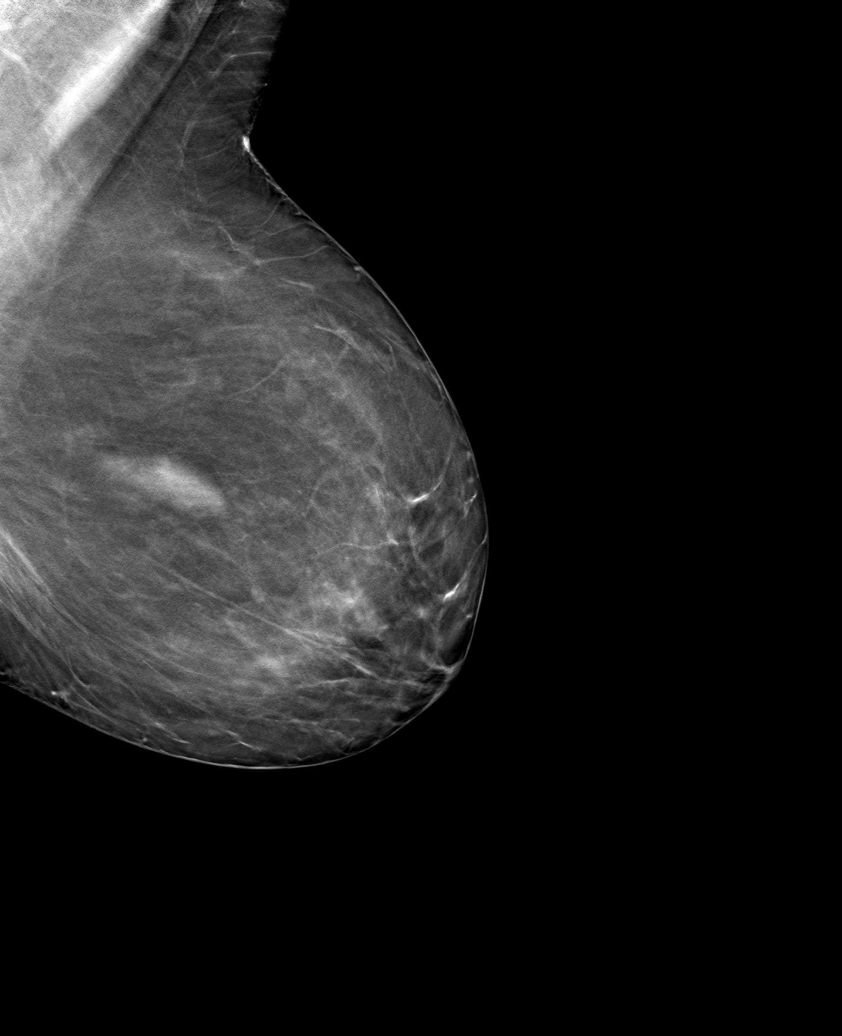

[6 of 30 positions shown; findings below may reference images not displayed]

ACR Breast Density Category b: There are scattered areas of
fibroglandular density.
FINDINGS: There are no findings suspicious for malignancy. Images were
processed with CAD.
IMPRESSION: No mammographic evidence of malignancy. A result letter of this
screening mammogram will be mailed directly to the patient.

RECOMMENDATION:
Screening mammogram in one year. (Code:[id number])

BI-RADS CATEGORY  1: Negative.

## 2019-03-12 ENCOUNTER — Telehealth: Payer: Self-pay | Admitting: Obstetrics & Gynecology

## 2019-03-12 ENCOUNTER — Encounter: Payer: Self-pay | Admitting: Obstetrics & Gynecology

## 2019-03-12 NOTE — Telephone Encounter (Signed)
Message  Hi Dr. Sabra Heck,    I'd like to update you on my symptoms. The discharge is gone! All that is left is moist skin and the skin color ranges from normal/pink/dark pink throughout the day.     Are these symptoms typical and I just need to wait a little longer for them to go away?     Or should I be putting something on the skin?    My last dose of Monistat 7 was Saturday evening. Feel free to email back at [facility] if that is easier.    Thanks very much,  Jamie Miller

## 2019-03-12 NOTE — Telephone Encounter (Signed)
Left message to call Amanda, CMA. °

## 2019-03-12 NOTE — Telephone Encounter (Signed)
Patient is returning call to [place].

## 2019-03-12 NOTE — Telephone Encounter (Signed)
Spoke with patient. She states Monistat 7 resolved vaginal itching/irritation. She denies any redness of vulvar, no itching, no vaginal odor. Patient only has some moist skin which could be from normal vaginal discharge. Advised as long as she had no thick clumpy discharge, no itching/irritation, no sores on vulva or no vaginal odor, I felt she was okay. If she had any further concerns, she would need an office visit.

## 2019-03-21 ENCOUNTER — Encounter: Payer: Self-pay | Admitting: Obstetrics & Gynecology

## 2019-03-21 ENCOUNTER — Telehealth: Payer: Self-pay | Admitting: Obstetrics & Gynecology

## 2019-03-21 NOTE — Telephone Encounter (Signed)
Patient complaining of unusual vaginal discharge. No itching or odor, but this is not normal for her. Made patient an appointment to see Dr.Miller on 03-24-19 4:30pm.

## 2019-03-21 NOTE — Telephone Encounter (Signed)
Good morning, Dr. Sabra Heck,    I couldn't remember if Estill Bamberg said to wait 1 or 2 weeks regarding this extra moisture I am feeling. It has been about a week and a half and it doesn't bother me, I am just noticing moisture is more than usual.    My question is:  Could the extra wetness/moisture be due to menopause or something else? Last cycle was end of May and this Wednesday (the15th) it appears a cycle has started but it is so light, it is hard to tell. I guess less than 1/2 teaspoon a day. It is a nice change and I realize light cycles can occur during menopause.    Feel free to respond to email MyChart.  Thank you very much,  Jamie Miller

## 2019-03-23 ENCOUNTER — Encounter: Payer: Self-pay | Admitting: Obstetrics & Gynecology

## 2019-03-24 ENCOUNTER — Encounter: Payer: Self-pay | Admitting: Obstetrics & Gynecology

## 2019-03-24 ENCOUNTER — Telehealth: Payer: Self-pay | Admitting: Obstetrics & Gynecology

## 2019-03-24 ENCOUNTER — Ambulatory Visit (INDEPENDENT_AMBULATORY_CARE_PROVIDER_SITE_OTHER): Payer: 59 | Admitting: Obstetrics & Gynecology

## 2019-03-24 ENCOUNTER — Other Ambulatory Visit: Payer: Self-pay

## 2019-03-24 VITALS — BP 110/70 | HR 92 | Temp 98.2°F | Ht 63.0 in | Wt 162.0 lb

## 2019-03-24 DIAGNOSIS — N898 Other specified noninflammatory disorders of vagina: Secondary | ICD-10-CM

## 2019-03-24 NOTE — Telephone Encounter (Signed)
Patient sent the following correspondence through [place]. Routing to triage to assist patient with request.  Hello,   I'd appreciate Dr. Ammie Ferrier opinion.    I talked with Estill Bamberg on Friday and she suggested coming into the office as I am still having more moisture than I usually do. Appointment is set for tomorrow afternoon (Monday the 20th). Over the weekend the moisture is still more than usual but it is less than it has been. It was constantly noticeable to now a little less noticeable. I wonder if I should keep tomorrow's appointment or wait to see if it continues to improve on its own.    On Wednesday-Thursday there was a very, very small cycle, just a few drops of blood and not sure if that is related.    You have been very patient with me regarding these symptoms and I appreciate that. I have been keeping very close to home due to Covid Virus and I want to make sure tomorrow's visit is crucial or I should wait and see if it continues to improve and what Dr. Sabra Heck recommends.    Thank you very much,  Jamie Miller

## 2019-03-24 NOTE — Progress Notes (Signed)
GYNECOLOGY  VISIT  CC:   Vaginal moisture, spot of blood 7/15, 7/16  HPI: 48 y.o. G69P0101 Married White or Caucasian female here for vaginal moisture.  She is having more issues with moisture in her inner thighs.  Denies vaginal odor.  Vaginal moisture does not seem to have any color.  She is having some mild hot flashes.  Sleep is about the same.  The moisture requires her to wear a pad.    Last cycle was May.  She did have a very small amount on July 15th and 16th.  She did not have any cramping with this spotting.    Pt asked for urine to be tested today.  This was negative.  She's had a little urinary urgency so wanted it tested.  GYNECOLOGIC HISTORY: Patient's last menstrual period was 03/19/2019 (approximate). Contraception: vasectomy  Menopausal hormone therapy: none  Patient Active Problem List   Diagnosis Date Noted  . Spondylosis of lumbosacral region without myelopathy or radiculopathy 09/07/2017  . Generalized abdominal pain 10/11/2016  . Dyspareunia in female 08/25/2015  . Irregular menses 04/16/2013  . Vitamin D deficiency 04/16/2013  . H/O: hypothyroidism 11/14/2012    Past Medical History:  Diagnosis Date  . Allergy   . Anxiety   . Asthma   . Bladder spasms 10/2017   doing pelvic floor PT  . Chicken pox   . OAB (overactive bladder)   . Thyroid disease    was on low dose synthroid at one time, but removed and levels have been "normal" since and followed thorugh GYN  . Vitamin D deficiency     Past Surgical History:  Procedure Laterality Date  . APPENDECTOMY  1990  . BREAST BIOPSY  2000   biopsy  . CERVICAL CERCLAGE  2009  . HYMENECTOMY  2005  . WISDOM TOOTH EXTRACTION      MEDS:   Current Outpatient Medications on File Prior to Visit  Medication Sig Dispense Refill  . cholecalciferol (VITAMIN D) 1000 units tablet Take 2,000 Units by mouth daily.    . MULTIPLE VITAMIN PO Take 1 tablet by mouth daily.    . Probiotic Product (ACIDOPHILUS/GOAT MILK)  CAPS Take by mouth.     No current facility-administered medications on file prior to visit.     ALLERGIES: Dairy aid  [lactase], Eggs or egg-derived products, Hemp seed oil  [dronabinol], Levothyroxine, Metronidazole, Nitrofurantoin monohyd macro, Strawberry (diagnostic), Wheat bran, Almond oil, Keflex [cephalexin], Nickel, Other, Clindamycin, Clindamycin hcl, and Cranberry  Family History  Problem Relation Age of Onset  . Hyperlipidemia Mother   . Diabetes Father   . Prostate cancer Father        prostate  . Prostate cancer Maternal Grandfather        prostate  . Prostate cancer Paternal Grandfather        prostate  . Arthritis Maternal Grandmother   . Lung cancer Paternal Grandmother     SH:  Married, non smoker  Review of Systems  Genitourinary:       Moisture   All other systems reviewed and are negative.   PHYSICAL EXAMINATION:    BP 110/70   Pulse 92   Temp 98.2 F (36.8 C) (Temporal)   Ht 5\' 3"  (1.6 m)   Wt 162 lb (73.5 kg)   LMP 03/19/2019 (Approximate)   BMI 28.70 kg/m     General appearance: alert, cooperative and appears stated age Lymph:  no inguinal LAD noted  Pelvic: External genitalia:  no lesions  Urethra:  normal appearing urethra with no masses, tenderness or lesions              Bartholins and Skenes: normal                 Vagina: normal appearing vagina with normal color and discharge, no lesions              Cervix: no lesions              Bimanual Exam:  Uterus:  normal size, contour, position, consistency, mobility, non-tender              Adnexa: no mass, fullness, tenderness  Chaperone was present for exam.  Assessment: Vaginal moisture  Plan: Affirm pending.

## 2019-03-24 NOTE — Telephone Encounter (Signed)
Spoke with patient and advised the need to keep appointment for this afternoon with Dr.Miller.

## 2019-03-26 LAB — VAGINITIS/VAGINOSIS, DNA PROBE
Candida Species: NEGATIVE
Gardnerella vaginalis: NEGATIVE
Trichomonas vaginosis: NEGATIVE

## 2019-04-10 ENCOUNTER — Telehealth: Payer: Self-pay | Admitting: Obstetrics & Gynecology

## 2019-04-10 ENCOUNTER — Encounter: Payer: Self-pay | Admitting: Obstetrics & Gynecology

## 2019-04-10 NOTE — Telephone Encounter (Signed)
Routing to Dr. Sabra Heck to review MyChart message and advise. Patient was seen in office on 03/25/19.

## 2019-04-10 NOTE — Telephone Encounter (Signed)
Patient sent the following correspondence through [place]. Routing to triage to assist patient with request.  Hello, Dr. Sabra Heck,    My cycle returned Tuesday, the 4th (haven't had a cycle since end of May 2020) and yesterday (cycle's second day) it became a little different than usual, so I wanted to check what I am experiencing is typical in this perimenopause phase.    I usually have some clots, but I have seen more than usual. They have been tiny or small in size, nothing large.    Every time I use the restroom there seems more blood than I usually see coming out me-not on the pad- it seems I have to wipe a lot to get all the blood.     Thanks very much.  Jamie Miller

## 2019-04-10 NOTE — Telephone Encounter (Signed)
Patient is calling to follow up on MyChart message.

## 2019-04-11 NOTE — Telephone Encounter (Signed)
Spoke with patient, advised as seen below per Dr. Sabra Heck. Questions answered.  Patient agreeable and verbalizes understanding.   Encounter closed.

## 2019-04-11 NOTE — Telephone Encounter (Signed)
Please let her know it's not uncommon to have heavier bleeding is skips several months between cycles.  If bleeding lasts more than 7 days or she is soaking through a full maxi pad and house, she needs to let me know.  Thanks.

## 2019-04-11 NOTE — Telephone Encounter (Signed)
Left message to call Rexton Greulich, RN at [facility] [phone].   

## 2019-04-11 NOTE — Telephone Encounter (Signed)
Patient is returning a call to Jill H. °

## 2019-04-25 ENCOUNTER — Telehealth: Payer: Self-pay | Admitting: Obstetrics & Gynecology

## 2019-04-25 ENCOUNTER — Encounter: Payer: Self-pay | Admitting: Obstetrics & Gynecology

## 2019-04-25 NOTE — Telephone Encounter (Signed)
Spoke with patient. Patient reports thick, creamy white d/c, vaginal itching, redness and feeling raw. Symptoms started 8/20. Denies vaginal bleeding, urinary symptoms or pain. Patient asking if she can use OTC monistat? Advised can try OTC Monistat 1, 3 or 7 day, if symptoms do not resolve, OV needed. Patient states she will try 3 day Monistat, requesting to schedule OV for next wk. OV scheduled for 8/25 at 4pm. Patient states if symptoms resolve she will call to cancel on Monday, will provide update. Advised I will update Dr. Sabra Heck and return call if any additional recommendations.   Routing to provider for final review. Patient is agreeable to disposition. Will close encounter.

## 2019-04-25 NOTE — Telephone Encounter (Signed)
Patient sent the following correspondence through [place].  Hello, Dr. Sabra Heck,    Late yesterday I saw a thicker and creamier discharge and my skin and lips area is very itchy, redder/pinker in color, raw, and a throbbing feeling. No odor and no real burning, just feels irritated.    With the weekend coming up I will need some relief, could I use Monistat 7 again as I did last month when I saw a similar discharge?    If it is another bacteria and not yeast, will I make things worse if I use Monistat 7?    Thank you very much,  Jamie Miller

## 2019-04-28 ENCOUNTER — Telehealth: Payer: Self-pay | Admitting: *Deleted

## 2019-04-28 ENCOUNTER — Encounter: Payer: Self-pay | Admitting: Obstetrics & Gynecology

## 2019-04-28 NOTE — Telephone Encounter (Signed)
Call to patient. Patient states that she has not used the Monistat 3 since Saturday night because the medication caused her a lot of discomfort. Patient states her symptoms are still "bothersome" today. Only using the external cream at this point. RN advised patient to keep appointment as scheduled for 04-29-2019 since she is still having symptoms. Patient agreeable.   Routing to provider and will close encounter.

## 2019-04-28 NOTE — Telephone Encounter (Signed)
Hi Dr. Coralie Common,    After second application of Monistat 3 I had severe itching and discomfort, so I did not use the third application. I am using the external cream 2x a day and I haven't had any reaction to that. I can take that up to 7 days.    My symptoms are still present, but have improved so should I wait to come in?    I took Monistat 7 for the first time last month and no side effects and yeast went away. This may be a more extreme case than that, could I try Monistat 7 again? Could I get a bad reaction like the other medicine?    If that would be okay, when should I start as I don't want to mix the medicines and make things worse.    Thanks very much.  Jamie Miller

## 2019-04-29 ENCOUNTER — Encounter: Payer: Self-pay | Admitting: Obstetrics & Gynecology

## 2019-04-29 ENCOUNTER — Other Ambulatory Visit: Payer: Self-pay

## 2019-04-29 ENCOUNTER — Ambulatory Visit (INDEPENDENT_AMBULATORY_CARE_PROVIDER_SITE_OTHER): Payer: 59 | Admitting: Obstetrics & Gynecology

## 2019-04-29 VITALS — BP 122/70 | HR 88 | Temp 98.2°F | Ht 63.0 in | Wt 162.0 lb

## 2019-04-29 DIAGNOSIS — N898 Other specified noninflammatory disorders of vagina: Secondary | ICD-10-CM

## 2019-04-29 DIAGNOSIS — R300 Dysuria: Secondary | ICD-10-CM | POA: Diagnosis not present

## 2019-04-29 LAB — POCT URINALYSIS DIPSTICK
Bilirubin, UA: NEGATIVE
Blood, UA: NEGATIVE
Glucose, UA: NEGATIVE
Ketones, UA: NEGATIVE
Nitrite, UA: NEGATIVE
Protein, UA: NEGATIVE
Urobilinogen, UA: 0.2 E.U./dL
pH, UA: 5 (ref 5.0–8.0)

## 2019-04-29 NOTE — Progress Notes (Signed)
GYNECOLOGY  VISIT  CC:   Vaginal discharge, dysuria   HPI: 48 y.o. G68P0101 Married White or Caucasian female here for vaginal discharge.  She feels like this is yeast and she used the Monistat 3.  She had a reaction to the Monistat including pain and irritation.  She did not complete the course.  She has used a 7 day monistat in the past and has not typically had issues.  She is still having some mild discharge today.  She would like evaluation.    She is having some mild dysuria right at the beginning of urination.  She is not sure is this is related.    She did have a cycle August 4th.  Flow lasted six days and there were several days of heavy flow with clots.   It was about three months with prior cycle.  We discussed using provera to trigger cycles if she skips for more than 90 days between cycles.     GYNECOLOGIC HISTORY: Patient's last menstrual period was 04/08/2019. Contraception: PMP Menopausal hormone therapy: none  Patient Active Problem List   Diagnosis Date Noted  . Spondylosis of lumbosacral region without myelopathy or radiculopathy 09/07/2017  . Generalized abdominal pain 10/11/2016  . Dyspareunia in female 08/25/2015  . Irregular menses 04/16/2013  . Vitamin D deficiency 04/16/2013  . H/O: hypothyroidism 11/14/2012    Past Medical History:  Diagnosis Date  . Allergy   . Anxiety   . Asthma   . Bladder spasms 10/2017   doing pelvic floor PT  . Chicken pox   . OAB (overactive bladder)   . Thyroid disease    was on low dose synthroid at one time, but removed and levels have been "normal" since and followed thorugh GYN  . Vitamin D deficiency     Past Surgical History:  Procedure Laterality Date  . APPENDECTOMY  1990  . BREAST BIOPSY  2000   biopsy  . CERVICAL CERCLAGE  2009  . HYMENECTOMY  2005  . WISDOM TOOTH EXTRACTION      MEDS:   Current Outpatient Medications on File Prior to Visit  Medication Sig Dispense Refill  . cholecalciferol (VITAMIN D)  1000 units tablet Take 2,000 Units by mouth daily.    . MULTIPLE VITAMIN PO Take 1 tablet by mouth daily.    . Probiotic Product (ACIDOPHILUS/GOAT MILK) CAPS Take by mouth.     No current facility-administered medications on file prior to visit.     ALLERGIES: Dairy aid  [lactase], Eggs or egg-derived products, Hemp seed oil  [dronabinol], Levothyroxine, Metronidazole, Nitrofurantoin monohyd macro, Strawberry (diagnostic), Wheat bran, Almond oil, Keflex [cephalexin], Nickel, Other, Clindamycin, Clindamycin hcl, and Cranberry  Family History  Problem Relation Age of Onset  . Hyperlipidemia Mother   . Diabetes Father   . Prostate cancer Father        prostate  . Prostate cancer Maternal Grandfather        prostate  . Prostate cancer Paternal Grandfather        prostate  . Arthritis Maternal Grandmother   . Lung cancer Paternal Grandmother     SH:  Married, non smoker  Review of Systems  Genitourinary: Positive for dysuria and vaginal discharge.       Vaginal itching Irritation   All other systems reviewed and are negative.   PHYSICAL EXAMINATION:    BP 122/70   Pulse 88   Temp 98.2 F (36.8 C) (Temporal)   Ht 5\' 3"  (1.6 m)  Wt 162 lb (73.5 kg)   LMP 04/08/2019   BMI 28.70 kg/m     General appearance: alert, cooperative and appears stated age Abdomen: soft, non-tender; bowel sounds normal; no masses,  no organomegaly Lymph:  no inguinal LAD noted  Pelvic: External genitalia:  no lesions              Urethra:  normal appearing urethra with no masses, tenderness or lesions              Bartholins and Skenes: normal                 Vagina: normal appearing vagina with normal color and discharge, no lesions              Cervix: no lesions              Bimanual Exam:  Uterus:  normal size, contour, position, consistency, mobility, non-tender              Adnexa: no mass, fullness, tenderness  Chaperone was present for exam.  Assessment: Vaginal  discharge Perimenopausal bleeding  Plan: Affirm pending She is going to call if she skips cycles for more than 90 days.

## 2019-04-30 ENCOUNTER — Telehealth: Payer: Self-pay

## 2019-04-30 ENCOUNTER — Encounter: Payer: Self-pay | Admitting: Obstetrics & Gynecology

## 2019-04-30 LAB — VAGINITIS/VAGINOSIS, DNA PROBE
Candida Species: POSITIVE — AB
Gardnerella vaginalis: POSITIVE — AB
Trichomonas vaginosis: NEGATIVE

## 2019-04-30 LAB — URINE CULTURE

## 2019-04-30 NOTE — Telephone Encounter (Signed)
Patient sent the following correspondence through [place].  Hi Dr. Sabra Heck.  I got a phone call that Metrogel cream was being called in for me. My chart says I have reactions to that. On Dec 18 mychart has that I had bacteria and it may be a different form but was treated for BV without the Metrogel cream. I don't think I had a reaction to what was prescribed in Dec. so is it possible to have that same medicine? I am just leery of another reaction from medicine.    Thanks so much.  Jamie Miller

## 2019-04-30 NOTE — Telephone Encounter (Signed)
Spoke with patient earlier (as seen below). I explained to patient that the prescription had not been sent to pharmacy at that time and that the medications that were prescribed in December were Fluconazole and Terconazole they were both used to treat yeast and not BV.  Would like to verify if patient could take oral Metronidazole. Please advise.

## 2019-04-30 NOTE — Telephone Encounter (Signed)
-----   Message from Megan Salon, MD sent at 04/30/2019  1:52 PM EDT ----- Please let pt know her testing showed yeast and BV.  I would recommend she treat with metrogel 0.75% nightly x 5 nights followed by Terazol 7 for 7 nights.  This way she avoids anything oral.  I would like to recheck in 3 weeks to repeat testing if she is ok with this.  Thanks.

## 2019-04-30 NOTE — Telephone Encounter (Signed)
Spoke with patient regarding the results as seen below. Per patient, she is allergic to Metrogel and unsure if she has ever had oral Flagyl in the past. Please advise. Patient is okay with the Terazol and states that she wishes to schedule the 3 week retesting once she starts treatment.

## 2019-05-01 ENCOUNTER — Encounter: Payer: Self-pay | Admitting: Obstetrics & Gynecology

## 2019-05-01 MED ORDER — TERCONAZOLE 0.4 % VA CREA: 1.0000 | TOPICAL_CREAM | Freq: Every day | VAGINAL | 0 refills | Status: DC

## 2019-05-01 MED ORDER — TINIDAZOLE 500 MG PO TABS: 500.0000 mg | ORAL_TABLET | Freq: Two times a day (BID) | ORAL | 0 refills | Status: DC

## 2019-05-01 NOTE — Telephone Encounter (Signed)
Ok to treat with oral Tinidazole (which she has tolerated) and Terazol 7 at the same time.

## 2019-05-01 NOTE — Telephone Encounter (Signed)
Patient states she had a reaction to metrogel. Allergies updated.  Rx sent to pharmacy.

## 2019-05-02 ENCOUNTER — Encounter: Payer: Self-pay | Admitting: Obstetrics & Gynecology

## 2019-05-02 ENCOUNTER — Telehealth: Payer: Self-pay | Admitting: Obstetrics & Gynecology

## 2019-05-02 NOTE — Telephone Encounter (Signed)
Spoke with patient. She used one dose of terazol 7 last night, menses started today. Patient does not want to switch to an oral pill for yeast. Advised patient to stop terazol 7, resume once menses is over. Will continue tindamax for BV. Questions answered. Patient verbalizes understanding.   Routing to provider for final review. Patient is agreeable to disposition. Will close encounter.

## 2019-05-02 NOTE — Telephone Encounter (Signed)
Patient sent the following message through [place]. Routing to triage to assist patient with request.  Hi Dr. Sabra Heck,   It appears that my cycle is starting today. (Last cycle was 25 days ago-August 4.) It seems this will affect me using the Terconazole cream? Should I still use Terconazole if my cycle appears? If not, do I just wait till my cycle is done? Or something else? Will this make the yeast worse if i can't use the cream?   Thanks very much.  Jamie Miller

## 2019-05-29 ENCOUNTER — Telehealth: Payer: Self-pay | Admitting: Obstetrics & Gynecology

## 2019-05-29 ENCOUNTER — Encounter: Payer: Self-pay | Admitting: Obstetrics & Gynecology

## 2019-05-29 NOTE — Telephone Encounter (Signed)
Spoke with patient. Reports clumpy, white vaginal d/c. No odor or itching. Denies vaginal bleeding or pelvic pain.   Advised patient she can use OTC monistat 7 or OV with Dr. Sabra Heck for further evaluation. Patient declines OV at this time, will try OTC monistat, is aware OV needed if symptoms do not improve or resolve. Advised OK to take OTC allergy medication while using Monistat. Questions answered.   Routing to provider for final review. Patient is agreeable to disposition. Will close encounter.

## 2019-05-29 NOTE — Telephone Encounter (Signed)
Patient sent the following message through [place]. Routing to triage to assist patient with request.  [name]  Phone Number: [phone]        Hello, Dr. Sabra Heck,   I just noticed a thicker, creamier discharge again, not my normal and wanted to know which option I can/should take. Not itchy, only symptom besides the discharge is some more moisture that comes and goes.   -Should I start using monistat 7 or the leftover terconazole 0.4% cream you prescribed from the last yeast infection not too long ago?   With the change of seasons, I am taking [facility] over the counter allergy medicine, with either of these interact with that?  Also, I seem to be getting several yeast infections lately and I know you mentioned it is premenopause, but is there anything I can do be doing differently?   Thank you,  Jamie Miller

## 2019-06-09 ENCOUNTER — Telehealth: Payer: Self-pay | Admitting: Obstetrics & Gynecology

## 2019-06-09 ENCOUNTER — Encounter: Payer: Self-pay | Admitting: Obstetrics & Gynecology

## 2019-06-09 NOTE — Telephone Encounter (Signed)
Call to patient. Patient states she completed the Monistat 7 on Friday. Did not have symptoms while taking Monistat. States today she noticed once some white, clumpy discharge. Denies odor. States she also had one time occurrence of itching that quickly went away. Declines OV at this time, states she doesn't want to miss a phone call about her dad while he is in the ICU. Asking if she should use another round of monistat 7 or now try the terconazole cream she has? RN advised would review with Dr. Sabra Heck and return call. Patient agreeable.   Routing to provider for review.

## 2019-06-09 NOTE — Telephone Encounter (Signed)
Visit Follow-Up Question Received: Today Message Contents  [name] sent to [place]  Phone Number: [phone]        Hello, Dr. Sabra Heck,   My last dose of Monistat 7 was Friday and did not see anything till today. There was a clump of white or creamy discharge. No odor or irritation and a tiny tiny amount of itching that quickly went away. Normally you'd suggest I schedule an appointment. Right now that is harder as my dad is in [place] and I need to be available when he calls. So, is it possible for me to do another round of Monistat 7 or use the prescription I still have left? Between the 2 prescription tubes I may have enough for 7 days which is the time originally prescribed.   Thank you.

## 2019-06-10 NOTE — Telephone Encounter (Signed)
Call to patient. Message given to patient as seen below from Dr. Sabra Heck and patient verbalized understanding. Patient aware to return call to office to schedule OV if symptoms do not resolve after treatment.   Routing to provider and will close encounter.

## 2019-06-10 NOTE — Telephone Encounter (Signed)
It's ok to use Terconazole for 7 days if she has this.  If persistent, I will then recommend repeat testing and discussion of treatment of recurrent yeast if the test is positive.  Thanks.

## 2019-06-20 ENCOUNTER — Encounter: Payer: Self-pay | Admitting: Obstetrics & Gynecology

## 2019-06-20 ENCOUNTER — Other Ambulatory Visit: Payer: Self-pay | Admitting: Obstetrics & Gynecology

## 2019-06-20 MED ORDER — TERCONAZOLE 0.4 % VA CREA: 1.0000 | TOPICAL_CREAM | Freq: Every day | VAGINAL | 0 refills | Status: DC

## 2019-06-27 ENCOUNTER — Telehealth: Payer: Self-pay | Admitting: Obstetrics & Gynecology

## 2019-06-27 ENCOUNTER — Encounter: Payer: Self-pay | Admitting: Obstetrics & Gynecology

## 2019-06-27 NOTE — Telephone Encounter (Signed)
Spoke with pt. Pt states having wetness and urge to urinate since Terconazole Rx was finished and having these sx x 2 days. Pt scheduled for OV with Dr Sabra Heck on 10/26 at 330pm. Pt agreeable. Pt wants to know if there is anything else she needs to do over the weekend.     Will route to Dr Sabra Heck for advice/recommendations.   Encounter closed.

## 2019-06-27 NOTE — Telephone Encounter (Signed)
I do not have any additional recommendations.  Ok to close encounter.

## 2019-06-27 NOTE — Telephone Encounter (Signed)
Patient sent the following message through [place]. Routing to triage to assist patient with request.  [name]  Phone Number: [phone]        [facility] Dr. Sabra Heck,   While on the terconazole, I had no symptoms and it was great. One day after finishing the prescription cream, I noticed more wetness and the urge to use the bathroom was more than what I experience normally. Is this normal/okay or do I need a consult or recheck?   Thank you,  Jamie Miller

## 2019-06-30 ENCOUNTER — Ambulatory Visit (INDEPENDENT_AMBULATORY_CARE_PROVIDER_SITE_OTHER): Payer: 59 | Admitting: Obstetrics & Gynecology

## 2019-06-30 ENCOUNTER — Encounter: Payer: Self-pay | Admitting: Obstetrics & Gynecology

## 2019-06-30 ENCOUNTER — Other Ambulatory Visit: Payer: Self-pay

## 2019-06-30 VITALS — BP 122/64 | HR 88 | Temp 97.3°F | Ht 63.0 in | Wt 163.0 lb

## 2019-06-30 DIAGNOSIS — R3915 Urgency of urination: Secondary | ICD-10-CM

## 2019-06-30 DIAGNOSIS — B373 Candidiasis of vulva and vagina: Secondary | ICD-10-CM | POA: Diagnosis not present

## 2019-06-30 DIAGNOSIS — B3731 Acute candidiasis of vulva and vagina: Secondary | ICD-10-CM

## 2019-06-30 DIAGNOSIS — R300 Dysuria: Secondary | ICD-10-CM

## 2019-06-30 LAB — POCT URINALYSIS DIPSTICK
Bilirubin, UA: NEGATIVE
Blood, UA: NEGATIVE
Glucose, UA: NEGATIVE
Ketones, UA: NEGATIVE
Nitrite, UA: NEGATIVE
Protein, UA: NEGATIVE
Urobilinogen, UA: NEGATIVE E.U./dL — AB
pH, UA: 5.5 (ref 5.0–8.0)

## 2019-06-30 MED ORDER — FLUCONAZOLE 150 MG PO TABS: 150.0000 mg | ORAL_TABLET | Freq: Once | ORAL | 3 refills | Status: AC

## 2019-06-30 NOTE — Progress Notes (Signed)
GYNECOLOGY  VISIT  CC:   Urinary urgency  HPI: 48 y.o. G32P0101 Married White or Caucasian female here for urgency that started about five days ago.  Denies painful urination.  Denies fever or back pain.  Denies blood in her urine.    Last cycle was 06/11/2019 and was light and short.  She's having increase breast pain.  MMG is due.    She's had multiple episodes of yeast vaginitis this year.  These all seem to follow a menstrual cycle.  She does not want to take suppressive therapy twice weekly as we've discussed.  She would prefer to take antifungal therapy after her cycle only.  Feel this is reasonable, although she is aware this is not the current recommendation for recurrent yeast vaginitis.    GYNECOLOGIC HISTORY: No LMP recorded. Patient is premenopausal. Contraception: none Menopausal hormone therapy: none  Patient Active Problem List   Diagnosis Date Noted  . Spondylosis of lumbosacral region without myelopathy or radiculopathy 09/07/2017  . Generalized abdominal pain 10/11/2016  . Dyspareunia in female 08/25/2015  . Irregular menses 04/16/2013  . Vitamin D deficiency 04/16/2013  . H/O: hypothyroidism 11/14/2012    Past Medical History:  Diagnosis Date  . Allergy   . Anxiety   . Asthma   . Bladder spasms 10/2017   doing pelvic floor PT  . Chicken pox   . OAB (overactive bladder)   . Thyroid disease    was on low dose synthroid at one time, but removed and levels have been "normal" since and followed thorugh GYN  . Vitamin D deficiency     Past Surgical History:  Procedure Laterality Date  . APPENDECTOMY  1990  . BREAST BIOPSY  2000   biopsy  . CERVICAL CERCLAGE  2009  . HYMENECTOMY  2005  . WISDOM TOOTH EXTRACTION      MEDS:   Current Outpatient Medications on File Prior to Visit  Medication Sig Dispense Refill  . cholecalciferol (VITAMIN D) 1000 units tablet Take 2,000 Units by mouth daily.    . MULTIPLE VITAMIN PO Take 1 tablet by mouth daily.    .  Probiotic Product (ACIDOPHILUS/GOAT MILK) CAPS Take by mouth.     No current facility-administered medications on file prior to visit.     ALLERGIES: Dairy aid  [lactase], Eggs or egg-derived products, Hemp seed oil  [dronabinol], Levothyroxine, Metrogel [metronidazole], Nitrofurantoin monohyd macro, Strawberry (diagnostic), Wheat bran, Almond oil, Keflex [cephalexin], Nickel, Other, Clindamycin, Clindamycin hcl, and Cranberry  Family History  Problem Relation Age of Onset  . Hyperlipidemia Mother   . Diabetes Father   . Prostate cancer Father        prostate  . Prostate cancer Maternal Grandfather        prostate  . Prostate cancer Paternal Grandfather        prostate  . Arthritis Maternal Grandmother   . Lung cancer Paternal Grandmother     SH:  Married, non smoker  Review of Systems  All other systems reviewed and are negative.   PHYSICAL EXAMINATION:    BP 122/64   Pulse 88   Temp (!) 97.3 F (36.3 C)   Ht 5\' 3"  (1.6 m)   Wt 163 lb (73.9 kg)   SpO2 98%   BMI 28.87 kg/m     General appearance: alert, cooperative and appears stated age Abdomen: soft, non-tender; bowel sounds normal; no masses,  no organomegaly Lymph:  no inguinal LAD noted  Pelvic: External genitalia:  no lesions  Urethra:  normal appearing urethra with no masses, tenderness or lesions              Bartholins and Skenes: normal                 Vagina: normal appearing vagina with normal color and discharge, no lesions              Cervix: no lesions              Bimanual Exam:  Uterus:  normal size, contour, position, consistency, mobility, non-tender              Adnexa: no mass, fullness, tenderness  Chaperone was present for exam.  Assessment: Urinary urgency H/o recurrent yeast vaginitis  Plan: Affirm pending Urine micro and culture pending If vagintis testing is negative, will treat urinary symptoms with Bactrim Rx for diflucan 150mg  po x 1, repeat in 72 hours to use  after menstrual cycles

## 2019-07-01 ENCOUNTER — Other Ambulatory Visit: Payer: Self-pay

## 2019-07-01 LAB — URINALYSIS, MICROSCOPIC ONLY
Bacteria, UA: NONE SEEN
Casts: NONE SEEN /lpf

## 2019-07-01 LAB — VAGINITIS/VAGINOSIS, DNA PROBE
Candida Species: NEGATIVE
Gardnerella vaginalis: POSITIVE — AB
Trichomonas vaginosis: NEGATIVE

## 2019-07-01 MED ORDER — TINIDAZOLE 500 MG PO TABS: ORAL_TABLET | ORAL | 0 refills | Status: DC

## 2019-07-02 ENCOUNTER — Telehealth: Payer: Self-pay | Admitting: Obstetrics & Gynecology

## 2019-07-02 ENCOUNTER — Encounter: Payer: Self-pay | Admitting: Obstetrics & Gynecology

## 2019-07-02 LAB — URINE CULTURE

## 2019-07-02 NOTE — Telephone Encounter (Signed)
Patient sent the following correspondence through [place].  Hello!  I have a few questions about taking my prescription this morning.  I got my prescription last night and took one dose of the Tinidazole (500 mg) as the bottle said to take 2 tablets a day. Later, I turned the bottle to the side and it said to take 2 tablets in the morning! I didn't remember it being like that. How should I proceed in my dosage?  1. Did I mess things up by taking one dose last night? How many hours apart should the doses be?  2. Will it hurt me to space the dosages apart or should I take the 2 tablets this morning even if I took one last night?  Thank you for setting my mind at ease. Jamie Miller

## 2019-07-02 NOTE — Telephone Encounter (Signed)
Spoke with pt. Med clarified on how to take  with pt. Pt verbalized understanding.   Encounter closed.   FYI Dr Sabra Heck

## 2019-07-04 ENCOUNTER — Telehealth: Payer: Self-pay | Admitting: Obstetrics & Gynecology

## 2019-07-04 ENCOUNTER — Encounter: Payer: Self-pay | Admitting: Obstetrics & Gynecology

## 2019-07-04 NOTE — Telephone Encounter (Signed)
Spoke with patient. Patient reports vaginal itching 2 -3 times since starting tindamax. Patient states she is unsure if she feels like she has increase in vaginal moisture or if she does have increased moisture. Denies vaginal d/c or odor. Reports urinary urgency, voicing normal amounts. Denies flank pain, blood in urine, dysuria, frequency.   06/30/19 UC negative. Vaginitis testing postitive for BV   Advised patient to compete tindamax as prescribed, if symptoms do not resolve or new symptoms develop, return call to office for OV. Advised Dr. Sabra Heck will review, our office will return call if any additional recommendations. Patient agreeable.   Routing to provider for final review. Patient is agreeable to disposition. Will close encounter.

## 2019-07-04 NOTE — Telephone Encounter (Signed)
Patient sent the following correspondence through [place].  Hello! I am sorry to be such a nuisance. With the upcoming weekend, wanted to put my mind at ease and to see if it is okay to wait things out or not. Yesterday, occasional itching started (2-3 x only). I have only had 1x of it today. I wonder if this my body healing or a symptom of the infection or another infection? I haven't seen any discharge thankfully.  I finish my Tinidazole Sunday and still have constant wetness and the urgency to use the restroom is still there.  Thank you so much for putting up with me. Jamie Miller

## 2019-07-07 ENCOUNTER — Encounter: Payer: Self-pay | Admitting: Obstetrics & Gynecology

## 2019-07-07 ENCOUNTER — Telehealth: Payer: Self-pay | Admitting: Obstetrics & Gynecology

## 2019-07-07 NOTE — Telephone Encounter (Signed)
Call reviewed with Dr. Sabra Heck. Call returned to patient, advised to go ahead and take diflucan, Rx sent on 06/30/19. Reviewed instructions with patient, patient verbalizes understanding and is agreeable.   Routing to provider for final review. Patient is agreeable to disposition. Will close encounter.

## 2019-07-07 NOTE — Telephone Encounter (Signed)
Patient sent the following message through [place]. Routing to triage to assist patient with request.  [name]  Phone Number: [phone]        Good morning!  Dr. Sabra Heck suggested I let her know if I still have symptoms once the Tinidazole is complete. I was done with prescription yesterday and my symptoms never changed. I still have constant wetness and the constant feeling to use the restroom. Occasionally I have itching but it doesn't last long, maybe 1-2x a day.   [name] said that it may take 7 days to not see the symptom(s), so should I just wait? Or do I try the douche Dr. Sabra Heck suggested?   Would it help the wetness to use talc baby powder?   Thank you as always,  Jamie Miller

## 2019-07-09 ENCOUNTER — Telehealth: Payer: Self-pay | Admitting: Family Medicine

## 2019-07-09 NOTE — Telephone Encounter (Signed)
Ok to transfer. 

## 2019-07-09 NOTE — Telephone Encounter (Signed)
Okay to transfer  

## 2019-07-09 NOTE — Telephone Encounter (Signed)
Sending to Horton Chin who is [facility] clinic scheduler.  Transfer of Care has been approved.

## 2019-07-09 NOTE — Telephone Encounter (Signed)
Please advise 

## 2019-07-09 NOTE — Telephone Encounter (Signed)
Copied from [place] [id number]. Topic: Appointment Scheduling - Transfer of Care >> Jul 08, 2019  4:26 PM Leward Quan A wrote: Pt is requesting to transfer FROM: Jamie Miller  Pt is requesting to transfer TO: [name] Reason for requested transfer: closer commute   Send CRM to patient's current PCP (transferring FROM).

## 2019-07-09 NOTE — Telephone Encounter (Signed)
Hinton Dyer could you send this to the scheduler for this PCP please

## 2019-07-13 DIAGNOSIS — E559 Vitamin D deficiency, unspecified: Secondary | ICD-10-CM

## 2019-07-13 HISTORY — DX: Vitamin D deficiency, unspecified: E55.9

## 2019-07-16 ENCOUNTER — Telehealth: Payer: Self-pay | Admitting: Obstetrics & Gynecology

## 2019-07-16 ENCOUNTER — Encounter: Payer: Self-pay | Admitting: Obstetrics & Gynecology

## 2019-07-16 NOTE — Telephone Encounter (Signed)
Patient sent the following correspondence through [place].  Good morning!  I wanted to check if it would help my symptoms to do the water/hydrogen peroxide douche Dr. Sabra Heck recommended. Part of my problem is not knowing if I am too focused on the symptoms and making them worse in my mind.   My prescriptions are complete and wetness and itching improved slightly over weekend. But yesterday a little itching came and went (not bothersome, just noticed) and wetness has never fully gone away. Wetness today seems more on the outer skin than inner as now seeing dryness on inner area.  Urgency is still there but if I consistently do my PT exercises, the urgency improves slightly so not convinced that is an infection symptom.  Thanks as always for the help, Shantell Remaly

## 2019-07-16 NOTE — Telephone Encounter (Signed)
Spoke with patient. She has completed Tindamax and diflucan as prescribed. Symptoms have improved. Reports occasional vaginal itching and increased moisture. Denies vaginal d/c, bleeding, odor or pain. Patient asking if ok to try douche that was discussed at [place]? Advised ok to try douche as previously recommended. Advised patient if symptoms do not resolve or symptoms worsen, will need OV. Patient verbalizes understanding and is agreeable.   Routing to provider for final review. Patient is agreeable to disposition. Will close encounter.

## 2019-07-18 ENCOUNTER — Telehealth: Payer: Self-pay | Admitting: Obstetrics & Gynecology

## 2019-07-18 ENCOUNTER — Encounter: Payer: Self-pay | Admitting: Obstetrics & Gynecology

## 2019-07-18 NOTE — Telephone Encounter (Signed)
Spoke with patient. Intermittent external vag itching. No change, not "bothersome". Creamy vaginal d/c with no odor x1 today at 12pm. External wetness improved since using douche on Wed. Occassional  burning when the urine touches the skin.   Denies vaginal odor, urinary symptoms, fever/chills, pelvic pain, vaginal bleeding.   Patient is going to try coconut oil to vulva over the weekend. If symptoms do not resolve, will return call to office for OV if symptoms are not resolved or improved.   Routing to provider for final review. Patient is agreeable to disposition. Will close encounter.

## 2019-07-18 NOTE — Telephone Encounter (Signed)
Patient sent the following correspondence through [place].  Hi Dr. Coralie Common,  It seems a symptom has returned. I just noticed creamy discharge and with no odor. Just wanted to know what I should do over the weekend, if anything. Could this be part of the re balancing that the douche does?  Itchiness about the same but the wetness seemed to be getting a little better since the douche Wednesday night. I didn't mix it terrifically as it burned a little when I first administered the douche.  Since last night I have noticed occasional very slight burning when I urinate, not painful. I think it is more do to rawness as I have been eating acidic fruit and vegetable lately. I put some cornstarch in the area so we shall see, hopefully that isn't the discharge I saw or I will feel really silly!   Thank you very much! Christella Noa

## 2019-07-23 ENCOUNTER — Other Ambulatory Visit: Payer: Self-pay

## 2019-07-24 ENCOUNTER — Encounter: Payer: Self-pay | Admitting: Physician Assistant

## 2019-07-24 ENCOUNTER — Ambulatory Visit (INDEPENDENT_AMBULATORY_CARE_PROVIDER_SITE_OTHER): Payer: 59 | Admitting: Physician Assistant

## 2019-07-24 ENCOUNTER — Other Ambulatory Visit (HOSPITAL_COMMUNITY)
Admission: RE | Admit: 2019-07-24 | Discharge: 2019-07-24 | Disposition: A | Discharge status: Home / Self Care | Payer: 59 | Source: Ambulatory Visit | Attending: Physician Assistant | Admitting: Physician Assistant

## 2019-07-24 VITALS — BP 120/80 | HR 85 | Temp 97.9°F | Ht 63.0 in | Wt 162.2 lb

## 2019-07-24 DIAGNOSIS — Z8639 Personal history of other endocrine, nutritional and metabolic disease: Secondary | ICD-10-CM

## 2019-07-24 DIAGNOSIS — N898 Other specified noninflammatory disorders of vagina: Secondary | ICD-10-CM

## 2019-07-24 DIAGNOSIS — L84 Corns and callosities: Secondary | ICD-10-CM

## 2019-07-24 DIAGNOSIS — R635 Abnormal weight gain: Secondary | ICD-10-CM

## 2019-07-24 LAB — TSH: TSH: 4.71 u[IU]/mL — ABNORMAL HIGH (ref 0.35–4.50)

## 2019-07-24 LAB — LUTEINIZING HORMONE: LH: 43.13 m[IU]/mL

## 2019-07-24 LAB — T4, FREE: Free T4: 0.91 ng/dL (ref 0.60–1.60)

## 2019-07-24 LAB — FOLLICLE STIMULATING HORMONE: FSH: 83.4 m[IU]/mL

## 2019-07-24 NOTE — Patient Instructions (Signed)
It was great to see you!  I will be in touch as soon as your labs have returned and will discuss when to follow-up and any further recommendations at that time.  Have a great holiday!!!  Take care,  [name] PA-C

## 2019-07-24 NOTE — Progress Notes (Signed)
Jamie Miller is a 48 y.o. female is here to discuss: Transfer of care.  I acted as a Education administrator for [employer], PA-C Anselmo Pickler, LPN  History of Present Illness:   Chief Complaint  Patient presents with  . Transfer of care    from Dr. Raoul Pitch    HPI   Weight gain Patient reports that she has been experiencing weight gain and is wondering if her hormones are off.  She states that she recently saw her gynecologist, Dr. Hale Bogus for urinary issues and vaginal discharge, and briefly touched on the subject.  She knew that she was coming to our office so her gynecologist recommended that we check labs for her.  She did have an episode H and LH checked about a year ago and they were normal.  Vaginal discharge She has been having intermittent issues with BV.  She was recently put on tonight's offer her symptoms.  She did complete this.  She has also been trying to apply coconut oil to her vulva for help with the irritation she is having.  She is wondering if she can have another vaginal swab today, because she thinks her discharge is not completely resolved and that she is still dealing with BV.  She has also been doing douches with hydrogen peroxide and lasted this November 11.  History of hypothyroidism She states that she was briefly on medication for her thyroid but was unable to tolerate the medication.  She is unable to remember what dosage but states it was alert. Current symptoms: weight changes . Patient denies diarrhea, heat / cold intolerance and palpitations. Symptoms have been intermittent.  Foot abnormality Patient reports that she has had a spot on her right great toe for quite some time She thinks that she might have a wart, she has not put anything on it.  Is not tender or painful.   There are no preventive care reminders to display for this patient.  Past Medical History:  Diagnosis Date  . Allergy   . Anxiety   . Asthma    outgrown  . Bladder spasms 10/2017    doing pelvic floor PT  . Chicken pox   . OAB (overactive bladder)   . Thyroid disease    was on low dose synthroid at one time, but removed and levels have been "normal" since and followed thorugh GYN  . Vitamin D deficiency 07/13/2019     Social History   Socioeconomic History  . Marital status: Married    Spouse name: Richardson Landry  . Number of children: 1  . Years of education: Not on file  . Highest education level: Not on file  Occupational History  . Not on file  Social Needs  . Financial resource strain: Not on file  . Food insecurity    Worry: Not on file    Inability: Not on file  . Transportation needs    Medical: Not on file    Non-medical: Not on file  Tobacco Use  . Smoking status: Never Smoker  . Smokeless tobacco: Never Used  Substance and Sexual Activity  . Alcohol use: Not on file  . Drug use: Not on file  . Sexual activity: Yes    Partners: Male    Birth control/protection: Other-see comments    Comment: Husband had vasectomy  Lifestyle  . Physical activity    Days per week: Not on file    Minutes per session: Not on file  . Stress: Not  on file  Relationships  . Social Herbalist on phone: Not on file    Gets together: Not on file    Attends religious service: Not on file    Active member of club or organization: Not on file    Attends meetings of clubs or organizations: Not on file    Relationship status: Not on file  . Intimate partner violence    Fear of current or ex partner: Not on file    Emotionally abused: Not on file    Physically abused: Not on file    Forced sexual activity: Not on file  Other Topics Concern  . Not on file  Social History Narrative   Married to Garza-Salinas II. Has a daughter Colletta [place].    College degree in writing. Did newspaper writing and magazine articles.   Drinks caffeine. Uses herbal remedies. Takes a daily vitamin.    Wears her seatbelt   Smoke detector in the home.    Feels safe in her relationships.      Past Surgical History:  Procedure Laterality Date  . APPENDECTOMY  1990  . BREAST BIOPSY  2000   biopsy  . CERVICAL CERCLAGE  2009  . HYMENECTOMY  2005  . WISDOM TOOTH EXTRACTION      Family History  Problem Relation Age of Onset  . Hyperlipidemia Mother   . Diabetes Father   . Prostate cancer Father        prostate  . Prostate cancer Maternal Grandfather        prostate  . Prostate cancer Paternal Grandfather        prostate  . Arthritis Maternal Grandmother   . Osteoporosis Maternal Grandmother        possibly  . Lung cancer Paternal Grandmother     PMHx, SurgHx, SocialHx, FamHx, Medications, and Allergies were reviewed in the Visit Navigator and updated as appropriate.   Patient Active Problem List   Diagnosis Date Noted  . Spondylosis of lumbosacral region without myelopathy or radiculopathy 09/07/2017  . Generalized abdominal pain 10/11/2016  . Dyspareunia in female 08/25/2015  . Irregular menses 04/16/2013  . Vitamin D deficiency 04/16/2013  . H/O: hypothyroidism 11/14/2012    Social History   Tobacco Use  . Smoking status: Never Smoker  . Smokeless tobacco: Never Used  Substance Use Topics  . Alcohol use: Not on file  . Drug use: Not on file    Current Medications and Allergies:    Current Outpatient Medications:  Marland Kitchen  MULTIPLE VITAMIN PO, Take 1 tablet by mouth daily., Disp: , Rfl:  .  Probiotic Product (ACIDOPHILUS/GOAT MILK) CAPS, Take by mouth., Disp: , Rfl:    Allergies  Allergen Reactions  . Dairy Aid  [Lactase] Rash  . Eggs Or Egg-Derived Products Rash  . Hemp Seed Oil  [Dronabinol] Rash    itching  . Levothyroxine     Other reaction(s): Other Tingling in legs; heart palpitation  . Metrogel [Metronidazole]     Other reaction(s): Other Cramps: Metrogel  Can tolerate Tinidazole   . Nitrofurantoin Monohyd Macro Diarrhea    Other reaction(s): Other Cramps  . Strawberry (Diagnostic) Rash    itching  . Wheat Bran Rash  . Almond Oil    . Keflex [Cephalexin]   . Nickel   . Other     Sensitive to all abx   . Clindamycin     Other reaction(s): Abdominal Pain  . Clindamycin Hcl Other (See Comments)  . Cranberry  Other reaction(s): Abdominal Pain    Review of Systems   ROS  Negative unless otherwise specified per HPI.   Vitals:   Vitals:   07/24/19 0933  BP: 120/80  Pulse: 85  Temp: 97.9 F (36.6 C)  TempSrc: Temporal  SpO2: 98%  Weight: 162 lb 4 oz (73.6 kg)  Height: 5\' 3"  (1.6 m)     Body mass index is 28.74 kg/m.   Physical Exam:    Physical Exam Vitals signs and nursing note reviewed. Exam conducted with a chaperone present.  Constitutional:      General: She is not in acute distress.    Appearance: She is well-developed. She is not ill-appearing or toxic-appearing.  Cardiovascular:     Rate and Rhythm: Normal rate and regular rhythm.     Pulses: Normal pulses.     Heart sounds: Normal heart sounds, S1 normal and S2 normal.     Comments: No LE edema Pulmonary:     Effort: Pulmonary effort is normal.     Breath sounds: Normal breath sounds.  Genitourinary:    Labia:        Right: No rash, tenderness or lesion.        Left: No rash, tenderness or lesion.      Vagina: Vaginal discharge (white, thin) present.     Cervix: Normal.  Skin:    General: Skin is warm and dry.     Comments: Raised calloused area to outer ball of R foot  Neurological:     Mental Status: She is alert.     GCS: GCS eye subscore is 4. GCS verbal subscore is 5. GCS motor subscore is 6.  Psychiatric:        Attention and Perception: Attention normal.        Mood and Affect: Mood is anxious.        Speech: Speech normal.        Behavior: Behavior normal. Behavior is cooperative.      Assessment and Plan:    Jarieliz was seen today for transfer of care.  Diagnoses and all orders for this visit:  H/O: hypothyroidism We will update labs and provide further recommendations based on results. -     TSH -     T4,  free  Weight gain I am going to update her thyroid labs and also check an [place] and LH per patient request.  If all this is normal we will have her follow-up with [place] to discuss weight gain further. -     Follicle stimulating hormone -     LH  Vaginal discharge I have obtained swab to rule out BV and yeast.  Will send results to Dr. Sabra Heck and come up with a plan and let patient know at that time. -     Cervicovaginal ancillary only( )  Corn of foot Offered to trial some cryo, however patient declined.  Recommend that she consider using cushions to put on her feet where her corn is.  She declines any further intervention at this time.   . Reviewed expectations re: course of current medical issues. . Discussed self-management of symptoms. . Outlined signs and symptoms indicating need for more acute intervention. . Patient verbalized understanding and all questions were answered. . See orders for this visit as documented in the electronic medical record. . Patient received an After Visit Summary.  CMA or LPN served as scribe during this visit. History, Physical, and Plan performed by medical provider. The  above documentation has been reviewed and is accurate and complete.   [name], PA-C [place] [place] [facility] 07/24/2019  Follow-up: No follow-ups on file.

## 2019-07-28 ENCOUNTER — Encounter: Payer: Self-pay | Admitting: Physician Assistant

## 2019-07-28 LAB — CERVICOVAGINAL ANCILLARY ONLY
Bacterial Vaginitis (gardnerella): POSITIVE — AB
Candida Glabrata: NEGATIVE
Candida Vaginitis: NEGATIVE
Comment: NEGATIVE
Comment: NEGATIVE
Comment: NEGATIVE

## 2019-07-29 ENCOUNTER — Other Ambulatory Visit: Payer: Self-pay | Admitting: Physician Assistant

## 2019-07-29 ENCOUNTER — Telehealth: Payer: Self-pay | Admitting: *Deleted

## 2019-07-29 MED ORDER — TINIDAZOLE 500 MG PO TABS: 500.0000 mg | ORAL_TABLET | Freq: Two times a day (BID) | ORAL | 0 refills | Status: DC

## 2019-07-29 NOTE — Telephone Encounter (Signed)
-----   Message from Megan Salon, MD sent at 07/29/2019  8:22 AM EST ----- Regarding: RE: It's fine to treat with the 5 day treatment of tindamax.  She needs to follow up with me in 7-10 days for repeat testing to make sure it is negative and then I will start her on treatment for recurrent bv.  Thanks.   I'm cc'ing this to my triage nurse, Glorianne Manchester, as well to make sure or gets scheduled.   Edwinna Areola ----- Message ----- From: [name], [place] Sent: 07/28/2019   3:28 PM EST To: Megan Salon, MD  Dr. Sabra Heck,  Please see chart on our mutual patient. She has recurrent BV that was just treated with tindamax, diflucan, and now she is doing douches.  I did pelvic on her and her BV is back. She ask that I reach out to you to determine the best option for treatment.  Thank you!  [place] PA-C [place]

## 2019-07-29 NOTE — Telephone Encounter (Signed)
Per review of Epic, Tindamax sent to pharmacy today by PCP.   Spoke with patient. Advised per Dr. Sabra Heck. Advised patient Rx has been sent by PCP. Patient will start medication today. OV scheduled for 12/1 at 11am with Dr. Sabra Heck.   Routing to provider for final review. Patient is agreeable to disposition. Will close encounter.

## 2019-07-30 ENCOUNTER — Telehealth: Payer: Self-pay | Admitting: Obstetrics & Gynecology

## 2019-07-30 ENCOUNTER — Encounter: Payer: Self-pay | Admitting: Obstetrics & Gynecology

## 2019-07-30 NOTE — Telephone Encounter (Signed)
Patient sent the following message through [place]. Routing to triage to assist patient with request.  [name]  Phone Number: [phone]        Hello, Dr. Coralie Common:   I just noticed itching has returned slightly. In case I see any yeast discharge over the holiday long weekend, am I correct that I am okay to wait to be checked at my appointment Tuesday morning or do I treat with Monistat? I am trying not to get so super focused on these symptoms, but they keep appearing and so for peace of mind I want to make sure it is okay to wait until my appointment early next week.   Thank you very much.  Jamie Miller

## 2019-07-30 NOTE — Telephone Encounter (Signed)
Sent mychart message

## 2019-08-01 ENCOUNTER — Ambulatory Visit: Payer: Self-pay | Admitting: *Deleted

## 2019-08-01 ENCOUNTER — Encounter: Payer: Self-pay | Admitting: Obstetrics & Gynecology

## 2019-08-01 NOTE — Telephone Encounter (Signed)
  See note below pertaining to Tindamax medication.  The office is closed for the Thanksgiving holiday.   I let her know she could also check with her pharmacist however she would rather discuss with [facility].    I let her know the office was closed so I let her know I would send a note to [facility] letting her know pt is coming up one day short on her medication.   She was agreeable to this since she is coming in Tuesday for a recheck.  I sent this note to [facility] office for Monday morning when they reopen.   Reason for Disposition . [1] DOUBLE DOSE (an extra dose or lesser amount) of prescription drug AND [2] NO symptoms (Exception: a double dose of antibiotics)    She is coming up one day short on this pill.   Is that ok since she is coming in on Tues 08/05/2019 for a recheck?  Answer Assessment - Initial Assessment Questions 1.   NAME of MEDICATION: "What medicine are you calling about?"     Tindamax 500 mg   Twice a day for BV. 2.   QUESTION: "What is your question?"     I started it Tuesday evening.   End Sunday morning.   Not enough for the last day.   What should  I do?  I'm 2 pills short? The bottle is labeled 10 pills.   She is to take 1 pill twice a day for 5 days.   She started her first dose Tuesday evening.  Been taking 2 pills twice a day since then but today Friday she is coming up 1 day short on pills.   She is very sure she has not taken more than prescribed and has not dropped any.  Is it ok that I'm coming up 1 day short?   I come in [place]. Dec. 1st for a follow up. 3.   PRESCRIBING HCP: "Who prescribed it?" Reason: if prescribed by specialist, call should be referred to that group.     [name], PA 4. SYMPTOMS: "Do you have any symptoms?"     My symptoms are improving. 5. SEVERITY: If symptoms are present, ask "Are they mild, moderate or severe?"     N/A 6.  PREGNANCY:  "Is there any chance that you are pregnant?" "When was your last menstrual period?"  Not asked  Protocols used: MEDICATION QUESTION CALL-A-AH

## 2019-08-01 NOTE — Telephone Encounter (Signed)
FYI

## 2019-08-04 ENCOUNTER — Other Ambulatory Visit: Payer: Self-pay

## 2019-08-04 NOTE — Telephone Encounter (Signed)
Spoke to pt told her Jamie Miller said it is okay that she missed a dose. Please be sure to follow-up with Ob-Gyn on Tuesday for re-check. Pt verbalized understanding.

## 2019-08-04 NOTE — Telephone Encounter (Signed)
Please call patient and let her know that it is okay that she missed a dose. Please be sure to follow-up with Ob-Gyn on Tuesday for re-check.

## 2019-08-05 ENCOUNTER — Telehealth: Payer: Self-pay | Admitting: Obstetrics & Gynecology

## 2019-08-05 ENCOUNTER — Encounter: Payer: Self-pay | Admitting: Obstetrics & Gynecology

## 2019-08-05 ENCOUNTER — Ambulatory Visit (INDEPENDENT_AMBULATORY_CARE_PROVIDER_SITE_OTHER): Payer: 59 | Admitting: Obstetrics & Gynecology

## 2019-08-05 VITALS — BP 130/78 | HR 90 | Temp 98.0°F | Resp 14 | Ht 65.0 in | Wt 166.1 lb

## 2019-08-05 DIAGNOSIS — N760 Acute vaginitis: Secondary | ICD-10-CM | POA: Diagnosis not present

## 2019-08-05 MED ORDER — NONFORMULARY OR COMPOUNDED ITEM: 0 refills | Status: DC

## 2019-08-05 NOTE — Progress Notes (Signed)
GYNECOLOGY  VISIT  CC:   Follow up after BV diagnosis, discuss lab work  HPI: 48 y.o. [id number] Married White or Caucasian female here for 7-10 follow up of BV.  She has been experiencing recurrent BV (with yeast at times) and has been treated multiple times this year.  She knows and admits she is hypersesitive to any vaginal discharge symptoms.  Most recently she was treated with Tindamax for BV.  She is here for repeat testing as well as discussed of recurrent BV symptoms.  Pt has multiple allergies which makes her treatment a little more complicated.  She cannot use Metrogel or metronidazole orally.  Use of boric acid was discussed with 30 day vaginal use which is based on Up To Date guidelines but we cannot use the metrogel after this so may need to continue once or twice weekly boric acid.  Pt is comfortable with this plan.  Denies vaginal bleeding.  Most recent [place] was in menopausal range.  She knows to call with any vaginal bleeding.  TSH was mildly elevated but free T4 was normal.  Diagnosis of subclinical hypothyroidism discussed.  She does want referral at this time but will consider this.    GYNECOLOGIC HISTORY: Patient's last menstrual period was 06/12/2019. Contraception: vasectomy Menopausal hormone therapy: none   Patient Active Problem List   Diagnosis Date Noted  . Spondylosis of lumbosacral region without myelopathy or radiculopathy 09/07/2017  . Generalized abdominal pain 10/11/2016  . Dyspareunia in female 08/25/2015  . Irregular menses 04/16/2013  . Vitamin D deficiency 04/16/2013  . H/O: hypothyroidism 11/14/2012    Past Medical History:  Diagnosis Date  . Allergy   . Anxiety   . Asthma    outgrown  . Bladder spasms 10/2017   doing pelvic floor PT  . Chicken pox   . OAB (overactive bladder)   . Thyroid disease    was on low dose synthroid at one time, but removed and levels have been "normal" since and followed thorugh GYN  . Vitamin D deficiency 07/13/2019     Past Surgical History:  Procedure Laterality Date  . APPENDECTOMY  1990  . BREAST BIOPSY  2000   biopsy  . CERVICAL CERCLAGE  2009  . HYMENECTOMY  2005  . WISDOM TOOTH EXTRACTION      MEDS:   Current Outpatient Medications on File Prior to Visit  Medication Sig Dispense Refill  . MULTIPLE VITAMIN PO Take 1 tablet by mouth daily.    . Probiotic Product (ACIDOPHILUS/GOAT MILK) CAPS Take by mouth.     No current facility-administered medications on file prior to visit.     ALLERGIES: Dairy aid  [lactase], Eggs or egg-derived products, Hemp seed oil  [dronabinol], Levothyroxine, Metrogel [metronidazole], Nitrofurantoin monohyd macro, Strawberry (diagnostic), Wheat bran, Almond oil, Keflex [cephalexin], Nickel, Other, Clindamycin, Clindamycin hcl, and Cranberry  Family History  Problem Relation Age of Onset  . Hyperlipidemia Mother   . Diabetes Father   . Prostate cancer Father        prostate  . Prostate cancer Maternal Grandfather        prostate  . Prostate cancer Paternal Grandfather        prostate  . Arthritis Maternal Grandmother   . Osteoporosis Maternal Grandmother        possibly  . Lung cancer Paternal Grandmother     SH:  Married, non smoker  Review of Systems  Genitourinary: Positive for vaginal discharge.       Redness Itching  All other systems reviewed and are negative.   PHYSICAL EXAMINATION:    BP 130/78 (BP Location: Left Arm, Patient Position: Sitting, Cuff Size: Normal)   Pulse 90   Temp 98 F (36.7 C) (Skin)   Resp 14   Ht 5\' 5"  (1.651 m)   Wt 166 lb 1.6 oz (75.3 kg)   LMP 06/12/2019   BMI 27.64 kg/m     General appearance: alert, cooperative and appears stated age Lymph:  no inguinal LAD noted  Pelvic: External genitalia:  no lesions              Urethra:  normal appearing urethra with no masses, tenderness or lesions              Bartholins and Skenes: normal                 Vagina: normal appearing vagina with normal color  and discharge, no lesions              Cervix: no lesions              Bimanual Exam:  Uterus:  normal size, contour, position, consistency, mobility, non-tender              Adnexa: no mass, fullness, tenderness  Chaperone was present for exam.  Assessment: Recurrent BV Subclinical hypothyroidism Recent FSH in menopausal range  Plan: BV/yeast testing obtained today.  She will be called with results.  If negative, consider nightly boric acid for 30 days, 600mg , and then once or twice weekly.  Rx was given.  If still positive, will retreat with Tindamax but longer course.

## 2019-08-05 NOTE — Telephone Encounter (Signed)
Patient sent the following message through [place]. Routing to triage to assist patient with request.  [name]  Phone Number: [phone]        Hello,   I'd like the borac acid be sent to [place], [place] [phone]. Thank you very much!

## 2019-08-05 NOTE — Telephone Encounter (Signed)
Rx for Boric Acid Vaginal Suppositories faxed to [place].   Patient notified by MyChart message.   Routing to provider for final review. Patient is agreeable to disposition. Will close encounter.

## 2019-08-08 LAB — NUSWAB BV AND CANDIDA, NAA
Candida albicans, NAA: NEGATIVE
Candida glabrata, NAA: NEGATIVE

## 2019-08-15 ENCOUNTER — Telehealth: Payer: Self-pay | Admitting: Obstetrics & Gynecology

## 2019-08-15 ENCOUNTER — Encounter: Payer: Self-pay | Admitting: Obstetrics & Gynecology

## 2019-08-15 NOTE — Telephone Encounter (Signed)
Call placed to patient. Started boric acid vaginal suppositories on 08/09/19. Started spotting today at 3:30pm, last [place] indicated menopause. Asking if she should continue boric acid vaginal suppositories? Denies any other symptoms. Advised I will review with Dr. Sabra Heck and return call, patient agreeable.

## 2019-08-15 NOTE — Telephone Encounter (Signed)
Call reviewed with Dr. Sabra Heck, call returned to patient. Advised to stop boric acid vaginal suppositories, can resume on Monday if vaginal  bleeding has stopped.  Return call to office on Monday if bleeding continues. Patient verbalizes understanding.   Routing to provider for final review. Patient is agreeable to disposition. Will close encounter.

## 2019-08-15 NOTE — Telephone Encounter (Signed)
Patient sent the following correspondence through [place].  Good afternoon!  Dr. Sabra Heck asked that I let her know when my cycle starts. It appears that my cycle has just started this afternoon. Very, very light with cramping (which sometimes happens with my cycles.) Am I correct that I should stop taking the borac acid while I have any menstral flow?  Thanks very much.  Jamie Miller

## 2019-08-18 ENCOUNTER — Telehealth: Payer: Self-pay | Admitting: Obstetrics & Gynecology

## 2019-08-18 ENCOUNTER — Encounter: Payer: Self-pay | Admitting: Obstetrics & Gynecology

## 2019-08-18 DIAGNOSIS — N926 Irregular menstruation, unspecified: Secondary | ICD-10-CM

## 2019-08-18 NOTE — Telephone Encounter (Signed)
As her [facility] was 55 in November and the light bleeding has still continued, I'd like to have her return for PUS.  Last one was 04/2018. Thanks.

## 2019-08-18 NOTE — Telephone Encounter (Signed)
Routing to Dr. Sabra Heck to review and advise on restart of boric acid vaginal suppositories.

## 2019-08-18 NOTE — Telephone Encounter (Signed)
Spoke with patient. Advised per Dr. Sabra Heck. PUS scheduled for 12/17 at 2:30pm, consult at 3pm with Dr. Sabra Heck. Patient needs to confirm this appt date and time with her husband and return call. Patient is aware phones go off at 4pm, so she will return call on 12/15 to confirm.   Order placed for PUS for precert.

## 2019-08-18 NOTE — Telephone Encounter (Signed)
Left message to call Ulanda Tackett, RN at [facility] [phone].   

## 2019-08-18 NOTE — Telephone Encounter (Signed)
Patient sent the following message through [place]. Routing to triage to assist patient with request.   [name]  Phone Number: [phone]  Hello, Jill/Dr. Sabra Heck,   I hope you had a nice weekend. Here's an update on my cycle and to double check when to return to borac acid.   Over weekend and today cycle is/was light. I never saw enough blood to change a pad. Blood is pinkish red in color. I saw the blood when I went to the restroom more than on an actual pad. The same is true this Monday morning. Since there is still blood I will not use the borac acid until it is not present.   Thanks very much,  Jamie Miller

## 2019-08-19 NOTE — Telephone Encounter (Signed)
Spoke with patient, patient requested to r/s PUS due to possibility of inclement weather. PUS r/s to 12/22 at 3:30pm, consult at 4pm with Dr. Sabra Heck. Patient reports spotting today, denies any other symptoms. Patient was instructed not to restart boric acid vag suppositories until seen in office for evaluation of bleeding.   Routing to provider for final review. Patient is agreeable to disposition. Will close encounter.  Cc: Lerry Liner, Otelia Limes

## 2019-08-19 NOTE — Telephone Encounter (Signed)
Patient canceled her PUS 08/21/19. She would like to reschedule to 09/04/19 .

## 2019-08-20 ENCOUNTER — Telehealth: Payer: Self-pay | Admitting: Obstetrics & Gynecology

## 2019-08-20 NOTE — Telephone Encounter (Signed)
Call placed to patient to review benefit for scheduled ultrasound appointment. Left voicemail message requesting a return call

## 2019-08-21 ENCOUNTER — Encounter: Payer: Self-pay | Admitting: Obstetrics & Gynecology

## 2019-08-21 ENCOUNTER — Telehealth: Payer: Self-pay | Admitting: *Deleted

## 2019-08-21 ENCOUNTER — Other Ambulatory Visit: Payer: Self-pay

## 2019-08-21 ENCOUNTER — Other Ambulatory Visit: Payer: Self-pay | Admitting: Obstetrics & Gynecology

## 2019-08-21 NOTE — Telephone Encounter (Signed)
Patient is scheduled for PUS 08/26/19  Routing to Dr. Sabra Heck to review and advise.

## 2019-08-21 NOTE — Telephone Encounter (Signed)
[name]  Phone Number: [phone]  Hello Dr. Sabra Heck,   Before I confirm my ultrasound appointment I had a few questions.  -Due to Covid, I'm trying to avoid procedures and wanted to wait on ultrasound if you agree.   -The spotting stopped and I realize my [place] levels will fluctuate.  -If it's safe to hold off ultrasound, I'd like to return to taking borac acid.   Thank you so much.  Jamie Miller

## 2019-08-21 NOTE — Telephone Encounter (Signed)
If she wants to wait, that's ok.  If there is any more irregular bleeding, I will recommend rescheduling the PUS.  Thanks.

## 2019-08-22 NOTE — Telephone Encounter (Signed)
Call reviewed with Dr. Sabra Heck. Call returned to patient. Advised per Dr. Sabra Heck, patient request to cancel PUS, PUS cancelled. Advised ok to restart boric acid vaginal suppositories, if bleeding or spotting occurs, should stop suppositories. Patient will return call to schedule PUS if any irregular bleeding.   Routing to provider for final review. Patient is agreeable to disposition. Will close encounter.  Cc: Lerry Liner, Magdalene Patricia

## 2019-08-22 NOTE — Telephone Encounter (Signed)
Patient is returning call to Jill, RN.  °

## 2019-08-22 NOTE — Telephone Encounter (Signed)
Left message to call Branch Pacitti, RN at [facility] [phone].   

## 2019-08-25 ENCOUNTER — Telehealth: Payer: Self-pay | Admitting: Obstetrics & Gynecology

## 2019-08-25 ENCOUNTER — Encounter: Payer: Self-pay | Admitting: Obstetrics & Gynecology

## 2019-08-25 NOTE — Telephone Encounter (Signed)
Patient sent the following correspondence through [place].  Hi Jill/Dr. Sabra Heck,  I just noticed this afternoon some cream colored discharge and wondered what to do at this point. I have no other symptoms. I resumed the borac acid last night.  Thank you as always.  Jamie Miller

## 2019-08-25 NOTE — Telephone Encounter (Signed)
Spoke with patient, advised per Dr. Miller. Patient verbalizes understanding and is agreeable. Encounter closed.  

## 2019-08-25 NOTE — Telephone Encounter (Signed)
If has no other symptoms, I do not want to do any additional testing at this time.  Thanks.

## 2019-08-25 NOTE — Telephone Encounter (Signed)
Routing MyChart message to Dr. Sabra Heck to review and advise.

## 2019-08-26 ENCOUNTER — Other Ambulatory Visit: Payer: Self-pay | Admitting: Obstetrics & Gynecology

## 2019-08-26 ENCOUNTER — Telehealth: Payer: Self-pay | Admitting: Obstetrics & Gynecology

## 2019-08-26 ENCOUNTER — Encounter: Payer: Self-pay | Admitting: Obstetrics & Gynecology

## 2019-08-26 ENCOUNTER — Other Ambulatory Visit: Payer: Self-pay

## 2019-08-26 NOTE — Telephone Encounter (Signed)
Please see mychart message and advise.

## 2019-08-26 NOTE — Telephone Encounter (Signed)
Patient sent the following correspondence through [place].  Hi Jill/Dr. Sabra Heck,  I am sorry to bother you again. I am not sure if there is a connection with the cream discharge I started seeing yesterday, but my stomach has been upset and occasional loose stools. (Also could be anxiety about the discharge?) My skin in the vagina is a little more red than usual and no actual burning, maybe burning for a second when I first start to urinate and then goes away.  Just wanted an expert to know. Thank you, Jamie Miller

## 2019-08-26 NOTE — Telephone Encounter (Signed)
Message sent to pt via my chart.  Ok to close encounter.

## 2019-09-16 ENCOUNTER — Telehealth: Payer: Self-pay | Admitting: Obstetrics & Gynecology

## 2019-09-16 ENCOUNTER — Encounter: Payer: Self-pay | Admitting: Obstetrics & Gynecology

## 2019-09-16 NOTE — Telephone Encounter (Signed)
Patient sent the following correspondence through [place].  Good afternoon!  I have a week left of boric acid and wanted to check if there was anything to do/take after that. The only 'symptoms' I notice are the skin is red and sometimes dry-toliet paper sticks to area. I put coconut oil on area and area appears less red though.  Thanks very much, Jamie Miller

## 2019-09-17 NOTE — Telephone Encounter (Signed)
Routing to Dr Sabra Heck for recommendations and advice.

## 2019-09-19 ENCOUNTER — Encounter: Payer: Self-pay | Admitting: Obstetrics & Gynecology

## 2019-09-19 ENCOUNTER — Telehealth: Payer: Self-pay | Admitting: *Deleted

## 2019-09-19 NOTE — Telephone Encounter (Signed)
Responded to pt about continuing boric acid vaginal twice weekly for another month or two vs just stopping it now.  Will await her my chart response.  OK to close encounter.

## 2019-09-19 NOTE — Telephone Encounter (Signed)
[name]  Phone Number: [phone]  [place], Dr. Sabra Heck,   At my December appointment, you mentioned me taking borac acid 1 or 2x a week once the first prescription is complete. This Monday night (the 18th) will be my last dose of borac acid, so, I wanted to check how to proceed.   Thank you very much,  Jamie Miller

## 2019-09-19 NOTE — Telephone Encounter (Signed)
Routed to Dr Sabra Heck for review and recommendations.

## 2019-09-19 NOTE — Telephone Encounter (Signed)
My chart message sent to patient about options for stopping or continuing.  Thanks.

## 2019-09-24 ENCOUNTER — Telehealth: Payer: Self-pay | Admitting: Obstetrics & Gynecology

## 2019-09-24 DIAGNOSIS — N760 Acute vaginitis: Secondary | ICD-10-CM

## 2019-09-24 MED ORDER — NONFORMULARY OR COMPOUNDED ITEM: 0 refills | Status: DC

## 2019-09-24 NOTE — Telephone Encounter (Signed)
Spoke to pt. Pt states had spoken to Dr Sabra Heck on 09/19/19 about new regimen to use boric acid twice weekly. Pt agreeable to new regimen. Pt has no refills left to use from previous Rx. Did use last suppository on 09/22/2019.  Placed new Rx for Boric Acid #30 suppository, 0RF on Dr Ammie Ferrier desk to sign and then fax to pharmacy at [facility]. Pt aware that Dr Loreta Ave is not in office today. Verbalized understanding of use twice weekly with boric acid. Pt agreeable.   Will route to Dr Sabra Heck for review and any additional recommendations.Will close encounter.

## 2019-09-24 NOTE — Telephone Encounter (Signed)
Patient would like to speak with nurse about a prescription for boric acid.

## 2019-09-25 DIAGNOSIS — Z9109 Other allergy status, other than to drugs and biological substances: Secondary | ICD-10-CM | POA: Insufficient documentation

## 2019-09-25 NOTE — Telephone Encounter (Signed)
Rx faxed to pharmacy on file. Pt made aware. Will close encounter.

## 2019-11-02 ENCOUNTER — Encounter: Payer: Self-pay | Admitting: Obstetrics & Gynecology

## 2019-11-03 ENCOUNTER — Other Ambulatory Visit: Payer: Self-pay

## 2019-11-03 ENCOUNTER — Telehealth: Payer: Self-pay | Admitting: Obstetrics & Gynecology

## 2019-11-03 ENCOUNTER — Ambulatory Visit (INDEPENDENT_AMBULATORY_CARE_PROVIDER_SITE_OTHER): Payer: 59 | Admitting: Obstetrics & Gynecology

## 2019-11-03 ENCOUNTER — Encounter: Payer: Self-pay | Admitting: Obstetrics & Gynecology

## 2019-11-03 VITALS — BP 138/72 | HR 85 | Temp 97.7°F | Wt 171.8 lb

## 2019-11-03 DIAGNOSIS — N3289 Other specified disorders of bladder: Secondary | ICD-10-CM | POA: Diagnosis not present

## 2019-11-03 DIAGNOSIS — R3915 Urgency of urination: Secondary | ICD-10-CM | POA: Diagnosis not present

## 2019-11-03 DIAGNOSIS — K625 Hemorrhage of anus and rectum: Secondary | ICD-10-CM | POA: Diagnosis not present

## 2019-11-03 LAB — POCT URINALYSIS DIPSTICK
Bilirubin, UA: NEGATIVE
Blood, UA: NEGATIVE
Glucose, UA: NEGATIVE
Ketones, UA: NEGATIVE
Leukocytes, UA: NEGATIVE
Nitrite, UA: NEGATIVE
Protein, UA: NEGATIVE
Urobilinogen, UA: NEGATIVE E.U./dL — AB
pH, UA: 7 (ref 5.0–8.0)

## 2019-11-03 NOTE — Telephone Encounter (Signed)
Patient sent the following correspondence through [place].  Hi Dr. Sabra Heck,  I'm experiencing some symptoms and not sure how to proceed. About 2 weeks ago bladder spasms started and today I noticed a small clump of light cream discharge. I also have some soreness in my rear but not sure there is a relation to the other symptoms.  I haven't experienced the spasms in a long time and thus haven't done my pelvic floor exercises. I started immediately when symptoms started and have noticed a tiny improvement. I constantly have the urge to urinate and occasionally leak when I bend down.  I haven't been checking for discharge daily anymore, but today I decided to as these symptoms have been constantly on my mind.  Thank you very much, Jamie Miller

## 2019-11-03 NOTE — Telephone Encounter (Signed)
Spoke with patient. Patient reports bladder spasms, urinary urgency and stress incontinence for the last 2 wks, started pelvic floor exercises, some improvement, symptoms have not resolved. Creamy, white vaginal d/c, no odor x 1 on 2/28. Reports rectum is red and sore, some bleeding on 2/28.   Denies dysuria, flank pain, vaginal bleeding, fever/chills.   [id number] prescreen negative. OV scheduled for today at 4:30pm with Dr. Sabra Heck.   Routing to provider for final review. Patient is agreeable to disposition. Will close encounter.

## 2019-11-03 NOTE — Progress Notes (Signed)
GYNECOLOGY  VISIT  CC:   Bladder spasm   HPI: 49 y.o. G60P0101 Married White or Caucasian female here for complaint of urinary urgency that has been present for about two weeks.  She particularly feels that way after urination.  She is having a little urinary leakage with bending and coughing.  She had this in the past but did pelvic PT and that really helped.  She restarted the PT exercises at home about two weeks.  Maybe that has made a little improvement.  She can hold urine if needed.  Denies blood in urine.  Has noticed a little blood when wiped with BMs x 2.  Does not know if she has hemorrhoids.  Has never been diagnosed with hemorrhoids.  She can have constipation but bran flakes really help when this occurs.  Typically does not have diarrhea but can have diarrhea when eats nuts.  Generally does not eat nuts.    GYNECOLOGIC HISTORY: LMP:  09/30/2019 Contraception: none  Menopausal hormone therapy: none   Patient Active Problem List   Diagnosis Date Noted  . Spondylosis of lumbosacral region without myelopathy or radiculopathy 09/07/2017  . Generalized abdominal pain 10/11/2016  . Dyspareunia in female 08/25/2015  . Vitamin D deficiency 04/16/2013  . H/O: hypothyroidism 11/14/2012    Past Medical History:  Diagnosis Date  . Allergy   . Anxiety   . Asthma    outgrown  . Bladder spasms 10/2017   doing pelvic floor PT  . Chicken pox   . OAB (overactive bladder)   . Thyroid disease    was on low dose synthroid at one time, but removed and levels have been "normal" since and followed thorugh GYN  . Vitamin D deficiency 07/13/2019    Past Surgical History:  Procedure Laterality Date  . APPENDECTOMY  1990  . BREAST BIOPSY  2000   biopsy  . CERVICAL CERCLAGE  2009  . HYMENECTOMY  2005  . WISDOM TOOTH EXTRACTION      MEDS:   Current Outpatient Medications on File Prior to Visit  Medication Sig Dispense Refill  . loratadine-pseudoephedrine (CLARITIN-D 24 HOUR) 10-240 MG 24  hr tablet Claritin-D 24 Hour    . MULTIPLE VITAMIN PO Take 1 tablet by mouth daily.    . NONFORMULARY OR COMPOUNDED ITEM Boric acid 600mg  vaginal suppositories.  Use 1 suppository twice weekly for 1-2 months. 30 each 0  . Probiotic Product (ACIDOPHILUS/GOAT MILK) CAPS Take by mouth.     No current facility-administered medications on file prior to visit.    ALLERGIES: Dairy aid  [lactase], Eggs or egg-derived products, Hemp seed oil  [dronabinol], Levothyroxine, Metrogel [metronidazole], Nitrofurantoin monohyd macro, Strawberry (diagnostic), Wheat bran, Almond oil, Keflex [cephalexin], Nickel, Other, Clindamycin, Clindamycin hcl, and Cranberry  Family History  Problem Relation Age of Onset  . Hyperlipidemia Mother   . Diabetes Father   . Prostate cancer Father        prostate  . Prostate cancer Maternal Grandfather        prostate  . Prostate cancer Paternal Grandfather        prostate  . Arthritis Maternal Grandmother   . Osteoporosis Maternal Grandmother        possibly  . Lung cancer Paternal Grandmother     SH:  Married, non smoker  Review of Systems  Genitourinary: Negative for urgency.  All other systems reviewed and are negative.   PHYSICAL EXAMINATION:    BP 138/72   Pulse 85   Temp  97.7 F (36.5 C) (Temporal)   Wt 171 lb 12.8 oz (77.9 kg)   SpO2 92%   BMI 28.59 kg/m     General appearance: alert, cooperative and appears stated age Lymph:  no inguinal LAD noted  Pelvic: External genitalia:                Urethra:  normal appearing urethra with no masses, tenderness or lesions              Bartholins and Skenes: normal                 Vagina: normal appearing vagina with normal color and discharge, no lesions              Cervix: no lesions              Bimanual Exam:  Uterus:  normal size, contour, position, consistency, mobility, non-tender              Adnexa: no mass, fullness, tenderness              Rectovaginal: Yes.  .  Confirms.              Anus:   normal sphincter tone, no lesions, no hemorrhoids noted  Chaperone, Terence Lux, CMA, was present for exam.  Assessment: Rectal bleeding x 2 without internal or external hemorrhoids on physical exam Gluteal clef skin fissure with mild surrounding skin erythema Urinary urgency  Plan: Hold boric acid for one week to see if this helps with urinary urgency sensation Urine culture pending.  Pt aware I think this will be negative. Neosporin recommended on the gluteal cleft fissure Referral to GI will be done She will restart her pelvic PT exercises to see if this helps the urinary urgency.  If it does not, I will start a small amount of vaginal estrogen to the periurethral area.

## 2019-11-05 ENCOUNTER — Encounter: Payer: Self-pay | Admitting: Obstetrics & Gynecology

## 2019-11-05 LAB — URINE CULTURE

## 2019-11-05 NOTE — Telephone Encounter (Signed)
Pt has GI appt with Dr Collene Mares on 11/11/2019 at 2 pm. Pt aware and agreeable.   Routing to Dr Sabra Heck for review and will close encounter.

## 2019-12-29 ENCOUNTER — Telehealth: Payer: Self-pay | Admitting: Obstetrics & Gynecology

## 2019-12-29 ENCOUNTER — Encounter: Payer: Self-pay | Admitting: Obstetrics & Gynecology

## 2019-12-29 NOTE — Telephone Encounter (Signed)
Patient was seen in office on 11/03/19.   Routing update to Dr. Sabra Heck

## 2019-12-29 NOTE — Telephone Encounter (Signed)
Non-Urgent Medical Question Received: Today Message Contents  [name] sent to [name]  Phone Number: [phone]  Hello Dr. Sabra Heck,   [place] suggested I let you know if my cycle lasted more than 7 days. Today is day 8 and I've noticed a little blood today. Not enough for a pad that's how small an amount I've seen. I'm not very concerned as the last day I needed a pad was day 6. Background: This cycle started April 19 and I only needed 1 pad per day and no clots.   Thank you very much,  Jamie Miller

## 2019-12-30 ENCOUNTER — Encounter: Payer: Self-pay | Admitting: Obstetrics & Gynecology

## 2019-12-30 NOTE — Telephone Encounter (Signed)
Encounter closed

## 2019-12-30 NOTE — Telephone Encounter (Signed)
My chart message sent to pt.  Ok to close encounter. 

## 2020-01-07 ENCOUNTER — Telehealth: Payer: Self-pay | Admitting: Obstetrics & Gynecology

## 2020-01-07 NOTE — Telephone Encounter (Signed)
Patient has not returned calls for scheduling Gasterenterology referral. Per Dr. Sabra Heck the referral has been closed.

## 2020-01-08 ENCOUNTER — Encounter: Payer: Self-pay | Admitting: Physician Assistant

## 2020-01-09 ENCOUNTER — Encounter: Payer: Self-pay | Admitting: Physician Assistant

## 2020-01-09 ENCOUNTER — Ambulatory Visit (INDEPENDENT_AMBULATORY_CARE_PROVIDER_SITE_OTHER): Payer: 59 | Admitting: Physician Assistant

## 2020-01-09 ENCOUNTER — Other Ambulatory Visit: Payer: Self-pay

## 2020-01-09 VITALS — BP 124/86 | HR 85 | Temp 98.7°F | Ht 65.0 in | Wt 162.0 lb

## 2020-01-09 DIAGNOSIS — W57XXXA Bitten or stung by nonvenomous insect and other nonvenomous arthropods, initial encounter: Secondary | ICD-10-CM | POA: Diagnosis not present

## 2020-01-09 DIAGNOSIS — S70362A Insect bite (nonvenomous), left thigh, initial encounter: Secondary | ICD-10-CM | POA: Diagnosis not present

## 2020-01-09 MED ORDER — DOXYCYCLINE HYCLATE 100 MG PO TABS: 100.0000 mg | ORAL_TABLET | Freq: Two times a day (BID) | ORAL | 0 refills | Status: DC

## 2020-01-09 NOTE — Progress Notes (Signed)
Jamie Miller is a 49 y.o. female here for a tick bite.  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 Tick Removal    HPI   Tick bite Pt noticed a tick bite her on Tuesday 01/06/20. She was outdoors over the weekend. She also has 3 bumps on the back of her left leg. Bumps are red and itching. She put rubbing alcohol and coconut oil on irritated areas. She states that it was a lonestar tick.   Denies: fevers, chills, nausea, discharge from lesions, fatigue, HA, joint pain  Reports that the tick was easy to remove.  Past Medical History:  Diagnosis Date  . Allergy   . Anxiety   . Asthma    outgrown  . Bladder spasms 10/2017   doing pelvic floor PT  . Chicken pox   . OAB (overactive bladder)   . Thyroid disease    was on low dose synthroid at one time, but removed and levels have been "normal" since and followed thorugh GYN  . Vitamin D deficiency 07/13/2019     Social History   Socioeconomic History  . Marital status: Married    Spouse name: Richardson Landry  . Number of children: 1  . Years of education: Not on file  . Highest education level: Not on file  Occupational History  . Not on file  Tobacco Use  . Smoking status: Never Smoker  . Smokeless tobacco: Never Used  Substance and Sexual Activity  . Alcohol use: Not on file  . Drug use: Not on file  . Sexual activity: Yes    Partners: Male    Birth control/protection: Other-see comments    Comment: Husband had vasectomy  Other Topics Concern  . Not on file  Social History Narrative   Married to [place]. Has a daughter Colletta [place].    College degree in writing. Did newspaper writing and magazine articles.   Drinks caffeine. Uses herbal remedies. Takes a daily vitamin.    Wears her seatbelt   Smoke detector in the home.    Feels safe in her relationships.    Social Determinants of Health   Financial Resource Strain:   .  Difficulty of Paying Living Expenses:   Food Insecurity:   . Worried About Charity fundraiser in the Last Year:   . Arboriculturist in the Last Year:   Transportation Needs:   . Film/video editor (Medical):   Marland Kitchen Lack of Transportation (Non-Medical):   Physical Activity:   . Days of Exercise per Week:   . Minutes of Exercise per Session:   Stress:   . Feeling of Stress :   Social Connections:   . Frequency of Communication with Friends and Family:   . Frequency of Social Gatherings with Friends and Family:   . Attends Religious Services:   . Active Member of Clubs or Organizations:   . Attends Archivist Meetings:   Marland Kitchen Marital Status:   Intimate Partner Violence:   . Fear of Current or Ex-Partner:   . Emotionally Abused:   Marland Kitchen Physically Abused:   . Sexually Abused:     Past Surgical History:  Procedure Laterality Date  . APPENDECTOMY  1990  . BREAST BIOPSY  2000   biopsy  . CERVICAL CERCLAGE  2009  . HYMENECTOMY  2005  . WISDOM TOOTH EXTRACTION  Family History  Problem Relation Age of Onset  . Hyperlipidemia Mother   . Diabetes Father   . Prostate cancer Father        prostate  . Prostate cancer Maternal Grandfather        prostate  . Prostate cancer Paternal Grandfather        prostate  . Arthritis Maternal Grandmother   . Osteoporosis Maternal Grandmother        possibly  . Lung cancer Paternal Grandmother     Allergies  Allergen Reactions  . Dairy Aid  [Lactase] Rash  . Eggs Or Egg-Derived Products Rash  . Hemp Seed Oil  [Dronabinol] Rash    itching  . Levothyroxine     Other reaction(s): Other Tingling in legs; heart palpitation  . Metrogel [Metronidazole]     Other reaction(s): Other Cramps: Metrogel  Can tolerate Tinidazole   . Nitrofurantoin Monohyd Macro Diarrhea    Other reaction(s): Other Cramps  . Strawberry (Diagnostic) Rash    itching  . Wheat Bran Rash  . Almond Oil   . Keflex [Cephalexin]   . Nickel   . Other      Sensitive to all abx   . Clindamycin     Other reaction(s): Abdominal Pain  . Clindamycin Hcl Other (See Comments)  . Cranberry     Other reaction(s): Abdominal Pain    Current Medications:   Current Outpatient Medications:  .  loratadine-pseudoephedrine (CLARITIN-D 24 HOUR) 10-240 MG 24 hr tablet, Claritin-D 24 Hour, Disp: , Rfl:  .  MULTIPLE VITAMIN PO, Take 1 tablet by mouth daily., Disp: , Rfl:  .  NONFORMULARY OR COMPOUNDED ITEM, Boric acid 600mg  vaginal suppositories.  Use 1 suppository twice weekly for 1-2 months., Disp: 30 each, Rfl: 0 .  Probiotic Product (ACIDOPHILUS/GOAT MILK) CAPS, Take by mouth., Disp: , Rfl:  .  doxycycline (VIBRA-TABS) 100 MG tablet, Take 1 tablet (100 mg total) by mouth 2 (two) times daily., Disp: 20 tablet, Rfl: 0   Review of Systems:   ROS  Negative unless otherwise specified per HPI.  Vitals:   Vitals:   01/09/20 1301  BP: 124/86  Pulse: 85  Temp: 98.7 F (37.1 C)  TempSrc: Temporal  SpO2: 95%  Weight: 162 lb (73.5 kg)  Height: 5\' 5"  (1.651 m)     Body mass index is 26.96 kg/m.  Physical Exam:   Physical Exam Constitutional:      Appearance: She is well-developed.  HENT:     Head: Normocephalic and atraumatic.  Eyes:     Conjunctiva/sclera: Conjunctivae normal.  Pulmonary:     Effort: Pulmonary effort is normal.  Musculoskeletal:        General: Normal range of motion.     Cervical back: Normal range of motion and neck supple.  Skin:    General: Skin is warm and dry.     Comments: Approximately quarter-sized area of erythema to lower anterior L thigh; 1 dime-sized area of erythema and 2 erythematous pinpoint areas to posterior L knee; all areas without discharge or tenderness  Neurological:     Mental Status: She is alert and oriented to person, place, and time.  Psychiatric:        Behavior: Behavior normal.        Thought Content: Thought content normal.        Judgment: Judgment normal.      Assessment and Plan:    Miah was seen today for tick removal.  Diagnoses and all orders  for this visit:  Insect bite of left thigh, initial encounter  Other orders -     doxycycline (VIBRA-TABS) 100 MG tablet; Take 1 tablet (100 mg total) by mouth 2 (two) times daily.   Will treat with oral doxycycline per orders given estimated duration of tick exposure as well as to cover for any secondary skin infection. Worsening precautions advised. Follow-up if any concerns.  . Reviewed expectations re: course of current medical issues. . Discussed self-management of symptoms. . Outlined signs and symptoms indicating need for more acute intervention. . Patient verbalized understanding and all questions were answered. . See orders for this visit as documented in the electronic medical record. . Patient received an After-Visit Summary.  CMA or LPN served as scribe during this visit. History, Physical, and Plan performed by medical provider. The above documentation has been reviewed and is accurate and complete.  [name], PA-C

## 2020-01-09 NOTE — Patient Instructions (Addendum)
It was great to see you!  Start the oral doxycyline.  Get help right away if: 1. You have joint pain. 2. You have a rash. 3. You feel unusually tired or sleepy. 4. You have neck pain. 5. You have a headache. 6. You have unusual weakness. 7. You develop symptoms of an anaphylactic reaction. These may include: ? Flushed skin. ? Hives. ? Swelling of the eyes, lips, face, mouth, tongue, or throat. ? Difficulty breathing, speaking, or swallowing. ? Wheezing. ? Dizziness or light-headedness. ? Fainting. ? Pain or cramping in the abdomen. ? Vomiting. ? Diarrhea.  Take care,  [name] PA-C

## 2020-01-12 ENCOUNTER — Encounter: Payer: Self-pay | Admitting: Physician Assistant

## 2020-01-16 ENCOUNTER — Encounter: Payer: Self-pay | Admitting: Physician Assistant

## 2020-01-16 ENCOUNTER — Encounter: Payer: Self-pay | Admitting: Obstetrics & Gynecology

## 2020-01-16 ENCOUNTER — Telehealth: Payer: Self-pay

## 2020-01-16 NOTE — Telephone Encounter (Signed)
[name]  Phone Number: [phone]  Hi Dr. Sabra Heck.   I'd appreciate your advice. My primary care doctor prescribed doxycycline hyclate for 4 tick bites! Since then I've noticed my outer skin irritated and bumpy when I use restroom. Im assuming due to digestive issues I've been having. I've had this skin discomfort before and usually used hydrocortisone cream 1%, coconut oil and or cornstarch. My primary care doctor suggested it could be a yeast infection due to the antibiotics and a prescription cream.  Since you've aided me in yeast infections before I wanted to see if I should come in to see you and check for an infection. I'm hesitant to try a new prescription over the weekend but don't want to make things worse by not treating it. I've sent my primary care a message about my concern as well. I don't recall you calling in a cream for a yeast infection so just wanted to double check.   Thank you.  Jamie Miller

## 2020-01-16 NOTE — Telephone Encounter (Signed)
Spoke with patient. Patient has since spoken with PCP. Patient denies vaginal d/c, odor, bleeding. Patient will try hydrocortisone cream externally as recommended by PCP for external vaginal bumps/irritation, ocassional itch. Patient will return call to office on Monday for OV if symptoms worsen or new symptoms develop. Patient thankful for call.   Routing to provider for final review. Patient is agreeable to disposition. Will close encounter.

## 2020-01-19 ENCOUNTER — Ambulatory Visit (INDEPENDENT_AMBULATORY_CARE_PROVIDER_SITE_OTHER): Payer: 59 | Admitting: Obstetrics & Gynecology

## 2020-01-19 ENCOUNTER — Other Ambulatory Visit: Payer: Self-pay

## 2020-01-19 ENCOUNTER — Encounter: Payer: Self-pay | Admitting: Obstetrics & Gynecology

## 2020-01-19 ENCOUNTER — Telehealth: Payer: Self-pay

## 2020-01-19 VITALS — BP 108/68 | HR 80 | Temp 98.4°F | Ht 65.0 in | Wt 163.0 lb

## 2020-01-19 DIAGNOSIS — N9089 Other specified noninflammatory disorders of vulva and perineum: Secondary | ICD-10-CM

## 2020-01-19 DIAGNOSIS — N898 Other specified noninflammatory disorders of vagina: Secondary | ICD-10-CM | POA: Diagnosis not present

## 2020-01-19 MED ORDER — TERCONAZOLE 0.4 % VA CREA: 1.0000 | TOPICAL_CREAM | Freq: Every day | VAGINAL | 0 refills | Status: AC

## 2020-01-19 NOTE — Telephone Encounter (Signed)
Patient immediately returned call. Patient states she has thick, white vaginal d/c, would like an OV. OV scheduled for today at 4:30pm with Dr. Sabra Heck. Covid 19 precautions reviewed, prescreen negative.  Routing to provider for final review. Patient is agreeable to disposition. Will close encounter.

## 2020-01-19 NOTE — Telephone Encounter (Signed)
Spoke with patient. Patient has been applying hydrocortisone cream externally BID since Friday. Hydrocortisone cream provides some relief. Symptoms have not gotten worse, only new symptom is opening to vaginal canal is sore. Skin is red, irritated and bumpy in spots. Denies vag d/c, odor or bleeding. Stopped Abx for tick bites on Saturday.  Has not used boric acid vaginal suppositories in 1 wk.   Asking if she should increase hydrocortisone cream? Restart boric acid? Or alternative tx? Declines OV at this time unless necessary.   Advised I will review with Dr. Sabra Heck and return call. Patient agreeable.

## 2020-01-19 NOTE — Progress Notes (Signed)
GYNECOLOGY  VISIT  CC:   Patient complains of having vulvar irritation and itching extending to her rectum. She also has a "creamy" discharge. Patient denies having an odor. Per patient, is currently taking doxycycline due to having several tick bites.  HPI: 49 y.o. G74P0101 Married White or Caucasian female here for vaginal discharge and irritation.  She's been on Doxycycline for about 8 days due to tick bites.  Was also having GI issues as well.  She started noticing discharge today.  She's had external skin irritation for about 5 days but has been getting gradually worse.  Denies dysuria but the urine is irritating to the skin.  Last cycle was 01/01/2020 and the prior one was a couple months prior.  April's cycle was heavier than the one in February.  Was having diarrhea but this stopped with stopping the doxycycline.    She is not using the boric acid right recently as she has not needed it for discharge.    GYNECOLOGIC HISTORY: Patient's last menstrual period was 12/22/2019. Contraception: vasectomy Menopausal hormone therapy: none  Patient Active Problem List   Diagnosis Date Noted  . Spondylosis of lumbosacral region without myelopathy or radiculopathy 09/07/2017  . Generalized abdominal pain 10/11/2016  . Dyspareunia in female 08/25/2015  . Vitamin D deficiency 04/16/2013  . H/O: hypothyroidism 11/14/2012    Past Medical History:  Diagnosis Date  . Allergy   . Anxiety   . Asthma    outgrown  . Bladder spasms 10/2017   doing pelvic floor PT  . Chicken pox   . OAB (overactive bladder)   . Thyroid disease    was on low dose synthroid at one time, but removed and levels have been "normal" since and followed thorugh GYN  . Vitamin D deficiency 07/13/2019    Past Surgical History:  Procedure Laterality Date  . APPENDECTOMY  1990  . BREAST BIOPSY  2000   biopsy  . CERVICAL CERCLAGE  2009  . HYMENECTOMY  2005  . WISDOM TOOTH EXTRACTION      MEDS:   Current Outpatient  Medications on File Prior to Visit  Medication Sig Dispense Refill  . doxycycline (VIBRA-TABS) 100 MG tablet Take 1 tablet (100 mg total) by mouth 2 (two) times daily. 20 tablet 0  . loratadine-pseudoephedrine (CLARITIN-D 24 HOUR) 10-240 MG 24 hr tablet Claritin-D 24 Hour    . MULTIPLE VITAMIN PO Take 1 tablet by mouth daily.    . NONFORMULARY OR COMPOUNDED ITEM Boric acid 600mg  vaginal suppositories.  Use 1 suppository twice weekly for 1-2 months. 30 each 0  . Probiotic Product (ACIDOPHILUS/GOAT MILK) CAPS Take by mouth.     No current facility-administered medications on file prior to visit.    ALLERGIES: Dairy aid  [lactase], Eggs or egg-derived products, Hemp seed oil  [dronabinol], Levothyroxine, Metrogel [metronidazole], Nitrofurantoin monohyd macro, Strawberry (diagnostic), Wheat bran, Almond oil, Keflex [cephalexin], Nickel, Other, Clindamycin, Clindamycin hcl, and Cranberry  Family History  Problem Relation Age of Onset  . Hyperlipidemia Mother   . Diabetes Father   . Prostate cancer Father        prostate  . Prostate cancer Maternal Grandfather        prostate  . Prostate cancer Paternal Grandfather        prostate  . Arthritis Maternal Grandmother   . Osteoporosis Maternal Grandmother        possibly  . Lung cancer Paternal Grandmother     SH:  Married, non smoker  Review  of Systems  Genitourinary:       Vaginal irritation Vaginal discharge Vulvar irritation  All other systems reviewed and are negative.   PHYSICAL EXAMINATION:    BP 108/68 (BP Location: Left Arm, Patient Position: Sitting, Cuff Size: Normal)   Pulse 80   Temp 98.4 F (36.9 C) (Temporal)   Ht 5\' 5"  (1.651 m)   Wt 163 lb (73.9 kg)   LMP 12/22/2019   BMI 27.12 kg/m     General appearance: alert, cooperative and appears stated age Lymph:  no inguinal LAD noted  Pelvic: External genitalia:  no lesions              Urethra:  normal appearing urethra with no masses, tenderness or lesions               Bartholins and Skenes: normal                 Vagina: erythematous vaginal mucosa with clumpy white discharge, no lesions              Cervix: no lesions              Bimanual Exam:  Uterus:  normal size, contour, position, consistency, mobility, non-tender              Adnexa: no mass, fullness, tenderness  Chaperone, Terence Lux, CMA, was present for exam.  Assessment: Vaginal discharge, vaginal erythema and irritation  Plan: Nuswab vaginitis testing obtained Terazol 7 rx to pharmacy.  Pt will use one applicator nightly x 7 nigths and then topically bid for the 7 days as well.

## 2020-01-19 NOTE — Telephone Encounter (Signed)
[name]  Phone Number: [phone]  Hello Dr. Sabra Heck,   Just wanted to follow up with my conversation with [name] on Friday and to see what I should do next. I've been using the hydrocortisone cream and it feels like it's working on initial contact and then later in day can't really tell if there is a change. I haven't seen any signs of yeast. It's just skin that's red, moist, irritated, bumpy in spots, and occassionally itchy and opening is sore. I've stopped taking the antibiotic so hopefully it will allow my skin to heal soon.   Thank you,  Jamie Miller

## 2020-01-21 LAB — NUSWAB BV AND CANDIDA, NAA
Candida albicans, NAA: POSITIVE — AB
Candida glabrata, NAA: NEGATIVE

## 2020-01-25 ENCOUNTER — Encounter: Payer: Self-pay | Admitting: Obstetrics & Gynecology

## 2020-01-26 ENCOUNTER — Telehealth: Payer: Self-pay

## 2020-01-26 ENCOUNTER — Encounter: Payer: Self-pay | Admitting: Obstetrics & Gynecology

## 2020-01-26 NOTE — Telephone Encounter (Signed)
Spoke with patient. Was seen in office on 01/19/20, tx for yeast with Terazol 7, applied externally for 6 nights and vaginal for 7 nights. Reports external vaginal itching that started last night. Reports redness still present, not as red as when she was seen in the office.   Has used hydrocortisone cream and coconut oil in the past externally. Patient is unsure what she needs to use this time since testing was positive for yeast.  Advised I will review with Dr. Sabra Heck and f/u. Patient agreeable.   Dr. Sabra Heck -please advise.

## 2020-01-26 NOTE — Telephone Encounter (Signed)
[name]  Phone Number: [phone]  Good morning!   Since late yesterday I've been very itchy. Haven't been itchy since being in your office last Monday. Not sure if this is due to stopping using prescription externally or something else.  I did use terconazole internally 7 days and externally 6 due to diarrhea which thankfully is now gone. The area, besides itching, is still a little tender not sure if due to diarrhea or not.   Thank you very much,  Jamie Miller

## 2020-01-28 ENCOUNTER — Encounter: Payer: Self-pay | Admitting: Obstetrics & Gynecology

## 2020-01-28 NOTE — Telephone Encounter (Signed)
See telephone encounter dated 01/26/20.   Encounter closed.

## 2020-01-28 NOTE — Telephone Encounter (Signed)
Mychart message sent to pt to see if she would be willing to try oral diflucan.  Thanks.

## 2020-01-28 NOTE — Telephone Encounter (Signed)
[name]  Phone Number: [phone]  Hi. I know Jamie Miller put this question to Dr. Sabra Heck but that was a few days ago.  I am still pretty itchy and semi sore especially in front area. It's a little less red. Tried coconut oil and that made it itchier. I put a little hydrocortisone cream on last night not much change yet. Should I continue with hydrocodone cream and how often?   Please email mychart instead of calling as I have a family member in hospital and I'm there now.   Thank you very much.  Jamie Miller     [employer] message to Dr. Sabra Heck

## 2020-01-29 ENCOUNTER — Encounter: Payer: Self-pay | Admitting: Obstetrics & Gynecology

## 2020-01-29 ENCOUNTER — Telehealth: Payer: Self-pay | Admitting: Obstetrics & Gynecology

## 2020-01-29 MED ORDER — FLUCONAZOLE 150 MG PO TABS
ORAL_TABLET | ORAL | 0 refills | Status: DC
Start: 2020-01-29 — End: 2020-02-27

## 2020-01-29 NOTE — Telephone Encounter (Addendum)
[name]  Phone Number: [phone]  Good morning.  Please submit the recommended prescription today. Itchiness is still there and more sore and tender today.   Thank you.      Rx pended for Diflucan, routing to Dr. Sabra Heck

## 2020-01-29 NOTE — Telephone Encounter (Signed)
See 01/28/20 MyChart message.   Encounter closed.

## 2020-01-29 NOTE — Telephone Encounter (Signed)
Patient check pharmacy and prescription is not there.

## 2020-01-29 NOTE — Telephone Encounter (Signed)
Ok to send rx for diflucan 150mg  po x 1, repeat in 72 hours.  #2/0RF

## 2020-01-29 NOTE — Telephone Encounter (Signed)
Rx sent.   Patient notified by MyChart.   Encounter closed.

## 2020-01-29 NOTE — Telephone Encounter (Signed)
See 01/29/20 telephone encounter.

## 2020-01-30 ENCOUNTER — Other Ambulatory Visit: Payer: Self-pay
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
## 2020-01-30 NOTE — Telephone Encounter (Signed)
Call placed to patient, left detailed message, ok per dpr. Advised per Dr. Sabra Heck. Return call to office if any additional questions. Advised our office is closed, will open on 6/1 at 8am.   Encounter closed.

## 2020-01-30 NOTE — Telephone Encounter (Signed)
It is ok to take any of these with the diflucan dosing.  Thanks.

## 2020-01-30 NOTE — Telephone Encounter (Signed)
Reviewed medication interaction in Up to Date   Title Ibuprofen / Fluconazole Risk Rating C: Monitor therapy Summary Fluconazole may increase the serum concentration of Ibuprofen. Severity Moderate Reliability Rating Good  Patient Management Monitor for increased ibuprofen toxicities (eg, gastrointestinal bleeding, renal toxicity) when combined with fluconazole.   Dr. Sabra Heck -any concerns with tylenol, Claritin or Motrin when taking diflucan?

## 2020-02-03 ENCOUNTER — Other Ambulatory Visit: Payer: Self-pay

## 2020-02-03 ENCOUNTER — Encounter: Payer: Self-pay | Admitting: Obstetrics & Gynecology

## 2020-02-03 ENCOUNTER — Ambulatory Visit (INDEPENDENT_AMBULATORY_CARE_PROVIDER_SITE_OTHER): Payer: 59 | Admitting: Obstetrics & Gynecology

## 2020-02-03 ENCOUNTER — Other Ambulatory Visit (HOSPITAL_COMMUNITY)
Admission: RE | Admit: 2020-02-03 | Discharge: 2020-02-03 | Disposition: A | Discharge status: Home / Self Care | Payer: 59 | Source: Ambulatory Visit | Attending: Obstetrics & Gynecology | Admitting: Obstetrics & Gynecology

## 2020-02-03 VITALS — BP 118/70 | HR 68 | Temp 98.2°F | Resp 16 | Ht 62.5 in | Wt 159.0 lb

## 2020-02-03 DIAGNOSIS — Z124 Encounter for screening for malignant neoplasm of cervix: Secondary | ICD-10-CM | POA: Insufficient documentation

## 2020-02-03 DIAGNOSIS — Z01419 Encounter for gynecological examination (general) (routine) without abnormal findings: Secondary | ICD-10-CM | POA: Diagnosis not present

## 2020-02-03 DIAGNOSIS — N9089 Other specified noninflammatory disorders of vulva and perineum: Secondary | ICD-10-CM

## 2020-02-05 ENCOUNTER — Telehealth: Payer: Self-pay

## 2020-02-05 ENCOUNTER — Encounter: Payer: Self-pay | Admitting: Obstetrics & Gynecology

## 2020-02-05 ENCOUNTER — Telehealth: Payer: Self-pay | Admitting: Obstetrics & Gynecology

## 2020-02-05 LAB — CYTOLOGY - PAP
Comment: NEGATIVE
Diagnosis: UNDETERMINED — AB
High risk HPV: NEGATIVE

## 2020-02-05 LAB — C ALBICANS + C GLABRATA, NAA
Candida albicans, NAA: NEGATIVE
Candida glabrata, NAA: NEGATIVE

## 2020-02-05 LAB — COLOGUARD

## 2020-02-05 NOTE — Telephone Encounter (Signed)
[name]  Phone Number: [phone]  Sorry for another email. Would you mind answering my message via mychart versus the phone? We have several arrangements for the funeral and I d hate to miss your call.   Am I correct that I can use Bo.butt paste and Aquafor. at the same time? I realize they are for different areas.   Thank you.  [name]

## 2020-02-05 NOTE — Telephone Encounter (Signed)
Visit Follow-Up Question Received: Today Message Contents  [name] sent to [facility]  Phone Number: [phone]  Hello Dr. Sabra Heck,   Do I use aquafor ointment or cream? Also, wanted to see if what I'm experiencing is stress or UTI or something else. I've been experiencing the feeling of having to use the restroom more frequenly than my normal and when I use the restroom it takes my muscles a moment to release and then I'm able to go. Tomorrow is my mom's funeral so maybe the stress and urgency feeling will get better after that. I don't have any real pain when I urinate but I second guess myself if it means a UTI.  Thank you.

## 2020-02-06 ENCOUNTER — Encounter: Payer: Self-pay | Admitting: Obstetrics & Gynecology

## 2020-02-06 ENCOUNTER — Telehealth: Payer: Self-pay

## 2020-02-06 NOTE — Telephone Encounter (Signed)
Encounter closed

## 2020-02-06 NOTE — Telephone Encounter (Signed)
See open encounter dated 02/05/20.   Encounter closed.

## 2020-02-06 NOTE — Telephone Encounter (Signed)
Leamon Arnt, CMA 15 hours ago (4:20 PM)     [name]  Phone Number: [phone]  Sorry for another email. Would you mind answering my message via mychart versus the phone? We have several arrangements for the funeral and I d hate to miss your call.   Am I correct that I can use Bo.butt paste and Aquafor. at the same time? I realize they are for different areas.   Thank you.  [name]       Routing to Dr. Sabra Heck to review and advise.

## 2020-02-06 NOTE — Telephone Encounter (Signed)
Left message for call back.

## 2020-02-06 NOTE — Telephone Encounter (Signed)
Mychart message sent to pt.  Ok to close encounter.

## 2020-02-06 NOTE — Telephone Encounter (Signed)
-----   Message from Megan Salon, MD sent at 02/06/2020  8:25 AM EDT ----- Please let pt know this pap is a normal pap smear results.  With recent yeast issues, this can cause the ASCUS change.  She does not need any additional follow up.  Repeat pap/HR HPV 3 years.  Needs 36 month recall.

## 2020-02-09 ENCOUNTER — Encounter: Payer: Self-pay | Admitting: Obstetrics & Gynecology

## 2020-02-10 NOTE — Telephone Encounter (Signed)
Patient notified of results. See lab 

## 2020-02-10 NOTE — Telephone Encounter (Signed)
Left message for call back.

## 2020-02-13 ENCOUNTER — Encounter: Payer: Self-pay | Admitting: Obstetrics & Gynecology

## 2020-02-20 ENCOUNTER — Telehealth: Payer: Self-pay | Admitting: Obstetrics & Gynecology

## 2020-02-20 NOTE — Telephone Encounter (Signed)
MyChart message to patient.   Routing to Dr. Lestine Box.   Encounter closed.
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
## 2020-02-27 ENCOUNTER — Ambulatory Visit: Payer: 59

## 2020-02-27 ENCOUNTER — Other Ambulatory Visit: Payer: Self-pay

## 2020-02-27 ENCOUNTER — Ambulatory Visit (INDEPENDENT_AMBULATORY_CARE_PROVIDER_SITE_OTHER): Payer: 59 | Admitting: *Deleted

## 2020-02-27 VITALS — BP 112/70 | HR 72 | Temp 98.4°F | Resp 14 | Ht 64.0 in | Wt 161.0 lb

## 2020-02-27 DIAGNOSIS — R3915 Urgency of urination: Secondary | ICD-10-CM

## 2020-02-27 LAB — POCT URINALYSIS DIPSTICK
Bilirubin, UA: NEGATIVE
Blood, UA: NEGATIVE
Glucose, UA: NEGATIVE
Ketones, UA: NEGATIVE
Leukocytes, UA: NEGATIVE
Nitrite, UA: NEGATIVE
Protein, UA: NEGATIVE
Urobilinogen, UA: 0.2 E.U./dL
pH, UA: 5 (ref 5.0–8.0)

## 2020-02-27 NOTE — Progress Notes (Signed)
Patient here for urinalysis and urine culture after menstrual cycle. Patient still complaining of urinary urgency, denies pain with urination. Clean catch urine provided. Urinalysis normal- patient advised. Patient asked if additional testing would be done. RN advised urine sent for urine culture and would be advised of results once received and reviewed by Dr. Sabra Heck. Patient agreeable.   Routing to provider and will close encounter.

## 2020-02-29 LAB — URINE CULTURE

## 2020-03-02 ENCOUNTER — Telehealth: Payer: Self-pay | Admitting: Obstetrics & Gynecology

## 2020-03-02 ENCOUNTER — Encounter: Payer: Self-pay | Admitting: Obstetrics & Gynecology

## 2020-03-02 NOTE — Telephone Encounter (Signed)
Borac Acid Received: Today Jamie Miller sent to [facility] Good afternoon~!   I am still using the B. Butt Paste off and on and this week started the Baby's Aquaphor for itching and wondered if I can still restart the Borac Acid 1-2x a week.   Thanks a lot,  [employer]

## 2020-03-03 NOTE — Telephone Encounter (Signed)
Spoke with patient. Patient reports some external vaginal itching, improved with use of butt paste and Aquaphor. Denies any other symptoms such as pain, redness, vaginal d/c or odor or bleeding. Asking of she should restart boric acid vaginal suppositories?   Advised patient since she is not currently experiencing any symptoms, do not restart boric acid vaginal suppositories. Advised patient if any new symptoms develop contact the office. Advised patient Dr. Sabra Heck is out of the office this week, will have covering provider review and our office will f/u if any additional recommendations. Patient agreeable.   Routing to covering provider for final review. Patient is agreeable to disposition. Will close encounter.

## 2020-03-05 ENCOUNTER — Encounter: Payer: Self-pay | Admitting: Obstetrics & Gynecology

## 2020-03-09 ENCOUNTER — Telehealth: Payer: Self-pay

## 2020-03-09 NOTE — Telephone Encounter (Signed)
Jamie Miller C  [facility] Hi [name]. Please add this information to Dr. Ammie Ferrier message when she returns. Thank you. Yesterday, July 2 I noticed the feeling of more moisture. I don't see actual evidence in my undergarments and maybe at restroom but feeling of moisture is throughout the day along with the feeling of urgency I've had for a month now. The frequency goes away eventually with daily pelvic floor exercises.  I've paused the aquaphor and b.buttpaste as those conditions improved. Could the extra the extra family stress or needing to drink more water causing it? I will start to take more water this weekend and see if that helps and will try not to worry. :)   Thanks and enjoy your vacation.  [name]

## 2020-03-09 NOTE — Telephone Encounter (Signed)
Routing to Dr Sabra Heck for review and recommendations.

## 2020-03-10 NOTE — Telephone Encounter (Signed)
Vaginal moisture can change due to hormonal fluctuations.  As she is not menopausal yet, this is common and nothing to worry about.  It's itching or odor with discharge that may need treatment, not just a change in moisture.  Thanks.

## 2020-03-10 NOTE — Telephone Encounter (Signed)
Left message for pt to return call to triage RN. 

## 2020-03-10 NOTE — Telephone Encounter (Signed)
Spoke with pt. Pt given recommendations and advice per Dr Sabra Heck. Pt agreeable and verbalized understanding.  Pt knows to return call to office with any vaginal itching or odor with discharge.   Routing to Dr Sabra Heck for review.  Encounter closed.

## 2020-05-21 ENCOUNTER — Encounter: Payer: Self-pay | Admitting: Obstetrics & Gynecology

## 2020-05-24 ENCOUNTER — Telehealth: Payer: Self-pay | Admitting: *Deleted

## 2020-05-24 NOTE — Telephone Encounter (Signed)
Spoke with patient. Patient reports area is improving with use of coconut oil daily. No broken skin that she can tell. No injuries, prolonged sitting or new activities. She will continue to use coconut oil since symptoms are improving. Will contact office if new symptoms develop or symptoms worsen.   Patient thankful for call.   Routing to provider for final review. Patient is agreeable to disposition. Will close encounter.

## 2020-05-24 NOTE — Telephone Encounter (Signed)
See telephone encounter dated 05/24/20.

## 2020-05-24 NOTE — Telephone Encounter (Signed)
[facility]ie Miller [facility] Hi Dr. Sabra Heck,  I hope you're doing great. You suggested I use Neosporin or Monistat 7 cream when there is soreness/pain or itchiness in my back end near crack area. I've done that and it helped but pain/soreness is still there after a week. Itchiness has gone away. Do I still continue the Neosporin over weekend or do something else?   Thank you,  Jamie Miller

## 2020-06-16 ENCOUNTER — Telehealth: Payer: Self-pay

## 2020-06-16 ENCOUNTER — Encounter: Payer: Self-pay | Admitting: Obstetrics & Gynecology

## 2020-06-16 NOTE — Telephone Encounter (Signed)
Patient is returning call. Patient would like to be reached at [phone]

## 2020-06-16 NOTE — Telephone Encounter (Signed)
Routing to Dr Sabra Heck. Please see pt mychart messages. Please advise

## 2020-06-16 NOTE — Telephone Encounter (Signed)
Left message for pt to return call to triage RN. Sent mychart message

## 2020-06-16 NOTE — Telephone Encounter (Signed)
Patient sent the following message via MyChart.  Good morning!  The irritation on my back side has returned. I have never had this issue before the summer and now it comes and goes. I can't actually see the condition but goes away after I use monistat 7 cream when itchy or Neosporin when irritated/painful.  Occasionally coconut oil helps.   Is there anything I can do to stop it from reoccurring all together? If not, should I continue to use this regimen as it eventually goes away.  Thanks, Jamie Miller

## 2020-06-18 NOTE — Telephone Encounter (Signed)
Spoke with pt. Pt scheduled for OV on 06/21/20 at 815 am. Pt agreeable to date and time of appt.  Encounter closed.

## 2020-06-18 NOTE — Telephone Encounter (Signed)
Can you see if she could come Monday am?  Thanks.

## 2020-06-21 ENCOUNTER — Ambulatory Visit (INDEPENDENT_AMBULATORY_CARE_PROVIDER_SITE_OTHER): Payer: 59 | Admitting: Obstetrics & Gynecology

## 2020-06-21 ENCOUNTER — Encounter: Payer: Self-pay | Admitting: Obstetrics & Gynecology

## 2020-06-21 ENCOUNTER — Other Ambulatory Visit: Payer: Self-pay

## 2020-06-21 VITALS — BP 104/70 | HR 68 | Resp 16 | Wt 164.0 lb

## 2020-06-21 DIAGNOSIS — L292 Pruritus vulvae: Secondary | ICD-10-CM | POA: Diagnosis not present

## 2020-06-21 NOTE — Progress Notes (Signed)
GYNECOLOGY  VISIT  CC:   Vulvar itching  HPI: 49 y.o. [id number] Married White or Caucasian female here for itching & irritation.  Currently, she is using aquaphor.  She's been using neosporin.  This is in the gluteal cleft region.  This is recurrent for her.  She started her cycle this morning.  It's been about a month since the last cycle.  She had been skipping about three months between cycles so this is a little concerning.  She's lost both parents and one step father since May, 2021.  She's dealing with two full estates.    GYNECOLOGIC HISTORY: Patient's last menstrual period was 06/21/2020 (exact date). Contraception: husband Menopausal hormone therapy: none  Patient Active Problem List   Diagnosis Date Noted  . Allergy to environmental factors 09/25/2019  . Spondylosis of lumbosacral region without myelopathy or radiculopathy 09/07/2017  . Generalized abdominal pain 10/11/2016  . Dyspareunia in female 08/25/2015  . Vitamin D deficiency 04/16/2013  . H/O: hypothyroidism 11/14/2012    Past Medical History:  Diagnosis Date  . Abnormal Pap smear of cervix    in 20's  . Allergy   . Anxiety   . Asthma    outgrown  . Bladder spasms 10/2017   doing pelvic floor PT  . Chicken pox   . OAB (overactive bladder)   . Thyroid disease    was on low dose synthroid at one time, but removed and levels have been "normal" since and followed thorugh GYN  . Vitamin D deficiency 07/13/2019    Past Surgical History:  Procedure Laterality Date  . APPENDECTOMY  1990  . BREAST BIOPSY  2000   biopsy  . CERVICAL CERCLAGE  2009  . HYMENECTOMY  2005  . WISDOM TOOTH EXTRACTION      MEDS:   Current Outpatient Medications on File Prior to Visit  Medication Sig Dispense Refill  . cholecalciferol (VITAMIN D3) 25 MCG (1000 UNIT) tablet     . loratadine-pseudoephedrine (CLARITIN-D 24 HOUR) 10-240 MG 24 hr tablet Claritin-D 24 Hour    . MULTIPLE VITAMIN PO Take 1 tablet by mouth daily.    .  Probiotic Product (ACIDOPHILUS/GOAT MILK) CAPS Take by mouth.     No current facility-administered medications on file prior to visit.    ALLERGIES: Dairy aid  [lactase], Eggs or egg-derived products, Hemp seed oil  [dronabinol], Levothyroxine, Metrogel [metronidazole], Nitrofurantoin monohyd macro, Strawberry (diagnostic), Wheat bran, Almond oil, Keflex [cephalexin], Nickel, Other, Clindamycin, Clindamycin hcl, and Cranberry  Family History  Problem Relation Age of Onset  . Hyperlipidemia Mother   . Diabetes Father   . Prostate cancer Father        prostate  . Prostate cancer Maternal Grandfather        prostate  . Prostate cancer Paternal Grandfather        prostate  . Arthritis Maternal Grandmother   . Osteoporosis Maternal Grandmother        possibly  . Lung cancer Paternal Grandmother     SH:  Single, non smoker  Review of Systems  Constitutional: Negative.   HENT: Negative.   Eyes: Negative.   Respiratory: Negative.   Cardiovascular: Negative.   Gastrointestinal: Negative.   Endocrine: Negative.   Genitourinary:       Vaginal irritation & itching  Musculoskeletal: Negative.   Skin: Negative.   Allergic/Immunologic: Negative.   Neurological: Negative.   Hematological: Negative.   Psychiatric/Behavioral: Negative.     PHYSICAL EXAMINATION:    BP 104/70  Pulse 68   Resp 16   Wt 164 lb (74.4 kg)   LMP 06/21/2020 (Exact Date)   BMI 28.15 kg/m     General appearance: alert, cooperative and appears stated age Lymph:  no inguinal LAD noted  Pelvic: External genitalia:  no lesions              Urethra:  normal appearing urethra with no masses, tenderness or lesions              Bartholins and Skenes: normal                 Vagina: normal appearing vagina with normal color and discharge, no lesions              Cervix: no lesions              Bimanual Exam:  Uterus:  normal size, contour, position, consistency, mobility, non-tender              Adnexa: no  mass, fullness, tenderness  Assessment: Vulvar itching/irritation Stressors with deaths of family members  Plan: Affirm obtained today   21 minutes of total time was spent for this patient encounter, including preparation, face-to-face counseling with the patient and coordination of care, and documentation of the encounter.

## 2020-06-22 ENCOUNTER — Other Ambulatory Visit: Payer: Self-pay

## 2020-06-22 ENCOUNTER — Telehealth: Payer: Self-pay

## 2020-06-22 ENCOUNTER — Encounter: Payer: Self-pay | Admitting: Obstetrics & Gynecology

## 2020-06-22 LAB — VAGINITIS/VAGINOSIS, DNA PROBE
Candida Species: NEGATIVE
Gardnerella vaginalis: POSITIVE — AB
Trichomonas vaginosis: NEGATIVE

## 2020-06-22 MED ORDER — TINIDAZOLE 500 MG PO TABS
1.0000 g | ORAL_TABLET | Freq: Once | ORAL | 0 refills | Status: AC
Start: 2020-06-22 — End: 2020-06-22

## 2020-06-22 NOTE — Telephone Encounter (Signed)
Royal Hawthorn, CMA, has called result to pt and sent Rx to pharmacy.  Ok to close encounter.

## 2020-06-22 NOTE — Telephone Encounter (Signed)
Routing to Dr Sabra Heck for review and results from 10/18, please advise

## 2020-06-22 NOTE — Telephone Encounter (Signed)
Pt sent following mychart message:  Jamie Miller, [facility] Hi Dr. Sabra Heck,   I just received test results via Mychart and wondered about the positive result for Gardnerella vaginalis. Is this related to my cycle starting or something else? Do I need to do anything about it at this point?   Thank you very much!  [name]

## 2020-06-28 ENCOUNTER — Encounter: Payer: Self-pay | Admitting: Obstetrics & Gynecology

## 2020-07-12 ENCOUNTER — Encounter: Payer: Self-pay | Admitting: Obstetrics & Gynecology

## 2020-07-21 ENCOUNTER — Telehealth: Payer: Self-pay

## 2020-07-21 NOTE — Telephone Encounter (Signed)
Spoke with pt. Pt concerned about having BTB bleeding after cycle.Pt states following:  LMP 06/21/20 LMP 11/7-11/13   Pt states started having light brown spotting on 11/14-16. Pt states only changing panty liner once a day. Denies any heavy bleeding, clots. Vaginal sx or vasomotor sx at this time. Pt advised Dr Sabra Heck not in office. Pt advised can have OV for evaluation. Pt declines at this time and will continue to monitor. Pt advised to return call to office for any further questions or concerns. Pt agreeable and thankful for call.  Routing to Dr Talbert Nan for review Encounter closed

## 2020-07-21 NOTE — Telephone Encounter (Signed)
Patient is returning call to Stephanie.

## 2020-07-21 NOTE — Telephone Encounter (Signed)
Pt sent following mychart message:  Jamie Miller to Megan Salon, MD     1:13 PM Hello Dr. Sabra Heck, I have a question about my cycle. It stopped on Saturday day 7. Today on day 9 it seems to have restarted a light flow. Do I need to do anything?  Cycle started Nov 7 and had no issues except it seemed to stop for half a day in middle and stopped on day 7. Nothing on pad day 8. Day 9 restarted.  Thanks very much. Jamie Miller July 12, 2020 Megan Salon, MD to Jamie Miller     7:51 AM Thanks for the update.  Every 21 days is ok.  We can just keep watching.    Edwinna Areola   Last read by Jamie Miller at 4:15 PM on 07/19/2020.

## 2020-08-18 ENCOUNTER — Encounter: Payer: Self-pay | Admitting: Physician Assistant

## 2020-08-18 ENCOUNTER — Other Ambulatory Visit: Payer: Self-pay

## 2020-08-18 ENCOUNTER — Ambulatory Visit (INDEPENDENT_AMBULATORY_CARE_PROVIDER_SITE_OTHER): Payer: 59 | Admitting: Physician Assistant

## 2020-08-18 VITALS — BP 118/79 | HR 81 | Temp 98.4°F | Ht 64.0 in | Wt 166.4 lb

## 2020-08-18 DIAGNOSIS — Z8639 Personal history of other endocrine, nutritional and metabolic disease: Secondary | ICD-10-CM

## 2020-08-18 DIAGNOSIS — Z1159 Encounter for screening for other viral diseases: Secondary | ICD-10-CM

## 2020-08-18 DIAGNOSIS — Z136 Encounter for screening for cardiovascular disorders: Secondary | ICD-10-CM

## 2020-08-18 DIAGNOSIS — H938X3 Other specified disorders of ear, bilateral: Secondary | ICD-10-CM

## 2020-08-18 DIAGNOSIS — E559 Vitamin D deficiency, unspecified: Secondary | ICD-10-CM | POA: Diagnosis not present

## 2020-08-18 DIAGNOSIS — K625 Hemorrhage of anus and rectum: Secondary | ICD-10-CM

## 2020-08-18 DIAGNOSIS — Z0001 Encounter for general adult medical examination with abnormal findings: Secondary | ICD-10-CM

## 2020-08-18 DIAGNOSIS — E663 Overweight: Secondary | ICD-10-CM

## 2020-08-18 DIAGNOSIS — Z13220 Encounter for screening for lipoid disorders: Secondary | ICD-10-CM

## 2020-08-18 DIAGNOSIS — Z114 Encounter for screening for human immunodeficiency virus [HIV]: Secondary | ICD-10-CM

## 2020-08-18 NOTE — Patient Instructions (Addendum)
It was great to see you!  More fiber!! Better portions. Start walking!!  Please go to the lab for blood work.   Our office will call you with your results unless you have chosen to receive results via MyChart.  If your blood work is normal we will follow-up each year for physicals and as scheduled for chronic medical problems.  If anything is abnormal we will treat accordingly and get you in for a follow-up.  Take care,  [facility] Maintenance, Female Adopting a healthy lifestyle and getting preventive care are important in promoting health and wellness. Ask your health care provider about:  The right schedule for you to have regular tests and exams.  Things you can do on your own to prevent diseases and keep yourself healthy. What should I know about diet, weight, and exercise? Eat a healthy diet   Eat a diet that includes plenty of vegetables, fruits, low-fat dairy products, and lean protein.  Do not eat a lot of foods that are high in solid fats, added sugars, or sodium. Maintain a healthy weight Body mass index (BMI) is used to identify weight problems. It estimates body fat based on height and weight. Your health care provider can help determine your BMI and help you achieve or maintain a healthy weight. Get regular exercise Get regular exercise. This is one of the most important things you can do for your health. Most adults should:  Exercise for at least 150 minutes each week. The exercise should increase your heart rate and make you sweat (moderate-intensity exercise).  Do strengthening exercises at least twice a week. This is in addition to the moderate-intensity exercise.  Spend less time sitting. Even light physical activity can be beneficial. Watch cholesterol and blood lipids Have your blood tested for lipids and cholesterol at 50 years of age, then have this test every 5 years. Have your cholesterol levels checked more often if:  Your lipid or  cholesterol levels are high.  You are older than 49 years of age.  You are at high risk for heart disease. What should I know about cancer screening? Depending on your health history and family history, you may need to have cancer screening at various ages. This may include screening for:  Breast cancer.  Cervical cancer.  Colorectal cancer.  Skin cancer.  Lung cancer. What should I know about heart disease, diabetes, and high blood pressure? Blood pressure and heart disease  High blood pressure causes heart disease and increases the risk of stroke. This is more likely to develop in people who have high blood pressure readings, are of African descent, or are overweight.  Have your blood pressure checked: ? Every 3-5 years if you are 5-14 years of age. ? Every year if you are 53 years old or older. Diabetes Have regular diabetes screenings. This checks your fasting blood sugar level. Have the screening done:  Once every three years after age 60 if you are at a normal weight and have a low risk for diabetes.  More often and at a younger age if you are overweight or have a high risk for diabetes. What should I know about preventing infection? Hepatitis B If you have a higher risk for hepatitis B, you should be screened for this virus. Talk with your health care provider to find out if you are at risk for hepatitis B infection. Hepatitis C Testing is recommended for:  Everyone born from [birth year] through 1965.  Anyone with known  risk factors for hepatitis C. Sexually transmitted infections (STIs)  Get screened for STIs, including gonorrhea and chlamydia, if: ? You are sexually active and are younger than 49 years of age. ? You are older than 49 years of age and your health care provider tells you that you are at risk for this type of infection. ? Your sexual activity has changed since you were last screened, and you are at increased risk for chlamydia or gonorrhea. Ask your  health care provider if you are at risk.  Ask your health care provider about whether you are at high risk for HIV. Your health care provider may recommend a prescription medicine to help prevent HIV infection. If you choose to take medicine to prevent HIV, you should first get tested for HIV. You should then be tested every 3 months for as long as you are taking the medicine. Pregnancy  If you are about to stop having your period (premenopausal) and you may become pregnant, seek counseling before you get pregnant.  Take 400 to 800 micrograms (mcg) of folic acid every day if you become pregnant.  Ask for birth control (contraception) if you want to prevent pregnancy. Osteoporosis and menopause Osteoporosis is a disease in which the bones lose minerals and strength with aging. This can result in bone fractures. If you are 54 years old or older, or if you are at risk for osteoporosis and fractures, ask your health care provider if you should:  Be screened for bone loss.  Take a calcium or vitamin D supplement to lower your risk of fractures.  Be given hormone replacement therapy (HRT) to treat symptoms of menopause. Follow these instructions at home: Lifestyle  Do not use any products that contain nicotine or tobacco, such as cigarettes, e-cigarettes, and chewing tobacco. If you need help quitting, ask your health care provider.  Do not use street drugs.  Do not share needles.  Ask your health care provider for help if you need support or information about quitting drugs. Alcohol use  Do not drink alcohol if: ? Your health care provider tells you not to drink. ? You are pregnant, may be pregnant, or are planning to become pregnant.  If you drink alcohol: ? Limit how much you use to 0-1 drink a day. ? Limit intake if you are breastfeeding.  Be aware of how much alcohol is in your drink. In the [place] one drink equals one 12 oz bottle of beer (355 mL), one 5 oz glass of wine (148  mL), or one 1 oz glass of hard liquor (44 mL). General instructions  Schedule regular health, dental, and eye exams.  Stay current with your vaccines.  Tell your health care provider if: ? You often feel depressed. ? You have ever been abused or do not feel safe at home. Summary  Adopting a healthy lifestyle and getting preventive care are important in promoting health and wellness.  Follow your health care provider's instructions about healthy diet, exercising, and getting tested or screened for diseases.  Follow your health care provider's instructions on monitoring your cholesterol and blood pressure. This information is not intended to replace advice given to you by your health care provider. Make sure you discuss any questions you have with your health care provider. Document Revised: 08/14/2018 Document Reviewed: 08/14/2018 Elsevier Patient Education  2020 [employer].

## 2020-08-18 NOTE — Progress Notes (Signed)
I acted as a Education administrator for [employer], PA-C Anselmo Pickler, LPN    Subjective:    Jamie Miller is a 49 y.o. female and is here for a comprehensive physical exam.   HPI  Health Maintenance Due  Topic Date Due  . Hepatitis C Screening  Never done    Acute Concerns: Ear popping - popping and draining; taking claritin every other day. No sinus congestion. Does have allergies with some slight nasal drip, this is normal for her. Denies: ear pain, fever, chills. Rectal bleeding -- patient reports that she is having rectal bleeding after hard BMs. She reports that she is not having this regularly. Denies lower abdominal pain, n/v, family hx of colon cancer, unintentional weight loss. Due for screening colonoscopy.  Chronic Issues: Hx of hypothyroidism -- hx of abnormal levels. She has trialed medication in the past wit some issues with tolerating medications so she did not stay on this long term. Vit D deficiency -- update Vit D level today per patient request.  Health Maintenance: Immunizations -- UTD, needs Tdap -- declines today Colonoscopy -- never Mammogram -- overdue PAP -- UTD, due 02/2023 Bone Density -- N/A Diet -- snacks and portions Caffeine intake -- coffee with milk; very rare sweet tea Sleep habits -- no significant concerns Exercise -- Needs to work on exercise Weight -- Weight: 166 lb 6.4 oz (75.5 kg)  Mood -- no concerns Weight history: Wt Readings from Last 10 Encounters:  08/18/20 166 lb 6.4 oz (75.5 kg)  06/21/20 164 lb (74.4 kg)  02/27/20 161 lb (73 kg)  02/03/20 159 lb (72.1 kg)  01/19/20 163 lb (73.9 kg)  01/09/20 162 lb (73.5 kg)  11/03/19 171 lb 12.8 oz (77.9 kg)  08/05/19 166 lb 1.6 oz (75.3 kg)  07/24/19 162 lb 4 oz (73.6 kg)  06/30/19 163 lb (73.9 kg)   Body mass index is 28.56 kg/m. No LMP recorded (within months). Alcohol use: none Tobacco use: none  Depression screen PHQ 2/9 08/18/2020  Decreased Interest 0  Down, Depressed,  Hopeless 0  PHQ - 2 Score 0  Altered sleeping -  Tired, decreased energy -  Change in appetite -  Feeling bad or failure about yourself  -  Trouble concentrating -  Moving slowly or fidgety/restless -  Suicidal thoughts -  PHQ-9 Score -   UTD with eye doctor and dentist  Other providers/specialists: Patient Care Team: [name], [place] as PCP - General (Physician Assistant)    PMHx, SurgHx, SocialHx, Medications, and Allergies were reviewed in the Visit Navigator and updated as appropriate.   Past Medical History:  Diagnosis Date  . Abnormal Pap smear of cervix    in 20's  . Allergy   . Anxiety   . Asthma    outgrown  . Bladder spasms 10/2017   doing pelvic floor PT  . Chicken pox   . OAB (overactive bladder)   . Thyroid disease    was on low dose synthroid at one time, but removed and levels have been "normal" since and followed thorugh GYN  . Vitamin D deficiency 07/13/2019     Past Surgical History:  Procedure Laterality Date  . APPENDECTOMY  1990  . BREAST BIOPSY  2000   biopsy  . CERVICAL CERCLAGE  2009  . HYMENECTOMY  2005  . WISDOM TOOTH EXTRACTION       Family History  Problem Relation Age of Onset  . Hyperlipidemia Mother   . Other Mother  fell down a flight of stairs  . Diabetes Father   . Prostate cancer Father        prostate  . Myasthenia gravis Father   . Prostate cancer Maternal Grandfather        prostate  . Prostate cancer Paternal Grandfather        prostate  . Arthritis Maternal Grandmother   . Osteoporosis Maternal Grandmother        possibly  . Lung cancer Paternal Grandmother     Social History   Tobacco Use  . Smoking status: Never Smoker  . Smokeless tobacco: Never Used  Vaping Use  . Vaping Use: Never used  Substance Use Topics  . Alcohol use: Not Currently  . Drug use: Never    Review of Systems:   Review of Systems  Constitutional: Negative for chills, fever, malaise/fatigue and weight loss.  HENT:  Negative for hearing loss, sinus pain and sore throat.   Respiratory: Negative for cough and hemoptysis.   Cardiovascular: Negative for chest pain, palpitations, leg swelling and PND.  Gastrointestinal: Positive for blood in stool. Negative for abdominal pain, constipation, diarrhea, heartburn, nausea and vomiting.  Genitourinary: Negative for dysuria, frequency and urgency.  Musculoskeletal: Negative for back pain, myalgias and neck pain.  Skin: Negative for itching and rash.  Neurological: Negative for dizziness, tingling, seizures and headaches.  Endo/Heme/Allergies: Negative for polydipsia.  Psychiatric/Behavioral: Negative for depression. The patient is not nervous/anxious.     Objective:   BP 118/79   Pulse 81   Temp 98.4 F (36.9 C) (Temporal)   Ht 5\' 4"  (1.626 m)   Wt 166 lb 6.4 oz (75.5 kg)   LMP  (Within Months)   SpO2 100%   BMI 28.56 kg/m  Body mass index is 28.56 kg/m.   General Appearance:    Alert, cooperative, no distress, appears stated age  Head:    Normocephalic, without obvious abnormality, atraumatic  Eyes:    PERRL, conjunctiva/corneas clear, EOM's intact, fundi    benign, both eyes  Ears:    Normal TM's and external ear canals, both ears  Nose:   Nares normal, septum midline, mucosa normal, no drainage    or sinus tenderness  Throat:   Lips, mucosa, and tongue normal; teeth and gums normal  Neck:   Supple, symmetrical, trachea midline, no adenopathy;    thyroid:  no enlargement/tenderness/nodules; no carotid   bruit or JVD  Back:     Symmetric, no curvature, ROM normal, no CVA tenderness  Lungs:     Clear to auscultation bilaterally, respirations unlabored  Chest Wall:    No tenderness or deformity   Heart:    Regular rate and rhythm, S1 and S2 normal, no murmur, rub or gallop  Breast Exam:    Deferred  Abdomen:     Soft, non-tender, bowel sounds active all four quadrants,    no masses, no organomegaly  Genitalia:    Deferred  Extremities:    Extremities normal, atraumatic, no cyanosis or edema  Pulses:   2+ and symmetric all extremities  Skin:   Skin color, texture, turgor normal, no rashes or lesions  Lymph nodes:   Cervical, supraclavicular, and axillary nodes normal  Neurologic:   CNII-XII intact, normal strength, sensation and reflexes    throughout    Assessment/Plan:   Gayathri was seen today for annual exam, ear popping , tick bite  and weight loss.  Diagnoses and all orders for this visit:  Encounter for general adult  medical examination with abnormal findings Today patient counseled on age appropriate routine health concerns for screening and prevention, each reviewed and up to date or declined. Immunizations reviewed and up to date or declined. Labs ordered and reviewed. Risk factors for depression reviewed and negative. Hearing function and visual acuity are intact. ADLs screened and addressed as needed. Functional ability and level of safety reviewed and appropriate. Education, counseling and referrals performed based on assessed risks today. Patient provided with a copy of personalized plan for preventive services.  Vitamin D deficiency Update vitamin D level today and provide recommendations as indicated. -     VITAMIN D 25 Hydroxy (Vit-D Deficiency, Fractures); Future -     VITAMIN D 25 Hydroxy (Vit-D Deficiency, Fractures)  H/O: hypothyroidism Update TSH today per patient request. -     CBC with Differential/Platelet; Future -     Comprehensive metabolic panel; Future -     TSH; Future -     TSH -     Comprehensive metabolic panel -     CBC with Differential/Platelet  Popping of both ears No red flags on exam. Suspect bilateral ETD. Trial flonase and follow-up if lack of improvement.  Encounter for lipid screening for cardiovascular disease -     Lipid panel; Future -     Lipid panel  Encounter for screening for other viral diseases -     Hepatitis C antibody; Future -     Hepatitis C  antibody  Screening for HIV (human immunodeficiency virus) -     HIV Antibody (routine testing w rflx); Future -     HIV Antibody (routine testing w rflx)  Overweight Recommend working on adding in exercise and better portions.  Rectal bleeding She is agreeable to referral to GI as she also needs screening colonoscopy. -     Ambulatory referral to Gastroenterology   Well Adult Exam: Labs ordered: Yes. Patient counseling was done. See below for items discussed. Discussed the patient's BMI.  The BMI is not in the acceptable range; BMI management plan is completed Follow up in one year. Breast cancer screening: counseled. Cervical cancer screening: UTD   Patient Counseling: [x]    Nutrition: Stressed importance of moderation in sodium/caffeine intake, saturated fat and cholesterol, caloric balance, sufficient intake of fresh fruits, vegetables, fiber, calcium, iron, and 1 mg of folate supplement per day (for females capable of pregnancy).  [x]    Stressed the importance of regular exercise.   [x]    Substance Abuse: Discussed cessation/primary prevention of tobacco, alcohol, or other drug use; driving or other dangerous activities under the influence; availability of treatment for abuse.   [x]    Injury prevention: Discussed safety belts, safety helmets, smoke detector, smoking near bedding or upholstery.   [x]    Sexuality: Discussed sexually transmitted diseases, partner selection, use of condoms, avoidance of unintended pregnancy  and contraceptive alternatives.  [x]    Dental health: Discussed importance of regular tooth brushing, flossing, and dental visits.  [x]    Health maintenance and immunizations reviewed. Please refer to Health maintenance section.   CMA or LPN served as scribe during this visit. History, Physical, and Plan performed by medical provider. The above documentation has been reviewed and is accurate and complete.  [name], PA-C [place]

## 2020-08-19 LAB — COMPREHENSIVE METABOLIC PANEL
AG Ratio: 1.6 (calc) (ref 1.0–2.5)
ALT: 16 U/L (ref 6–29)
AST: 19 U/L (ref 10–35)
Albumin: 3.9 g/dL (ref 3.6–5.1)
Alkaline phosphatase (APISO): 64 U/L (ref 31–125)
BUN: 18 mg/dL (ref 7–25)
CO2: 28 mmol/L (ref 20–32)
Calcium: 9 mg/dL (ref 8.6–10.2)
Chloride: 106 mmol/L (ref 98–110)
Creat: 0.68 mg/dL (ref 0.50–1.10)
Globulin: 2.4 g/dL (calc) (ref 1.9–3.7)
Glucose, Bld: 88 mg/dL (ref 65–99)
Potassium: 4.2 mmol/L (ref 3.5–5.3)
Sodium: 141 mmol/L (ref 135–146)
Total Bilirubin: 0.5 mg/dL (ref 0.2–1.2)
Total Protein: 6.3 g/dL (ref 6.1–8.1)

## 2020-08-19 LAB — CBC WITH DIFFERENTIAL/PLATELET
Absolute Monocytes: 487 cells/uL (ref 200–950)
Basophils Absolute: 28 cells/uL (ref 0–200)
Basophils Relative: 0.5 %
Eosinophils Absolute: 129 cells/uL (ref 15–500)
Eosinophils Relative: 2.3 %
HCT: 40.6 % (ref 35.0–45.0)
Hemoglobin: 13.7 g/dL (ref 11.7–15.5)
Lymphs Abs: 1428 cells/uL (ref 850–3900)
MCH: 31.9 pg (ref 27.0–33.0)
MCHC: 33.7 g/dL (ref 32.0–36.0)
MCV: 94.6 fL (ref 80.0–100.0)
MPV: 10.7 fL (ref 7.5–12.5)
Monocytes Relative: 8.7 %
Neutro Abs: 3528 cells/uL (ref 1500–7800)
Neutrophils Relative %: 63 %
Platelets: 244 10*3/uL (ref 140–400)
RBC: 4.29 10*6/uL (ref 3.80–5.10)
RDW: 11.8 % (ref 11.0–15.0)
Total Lymphocyte: 25.5 %
WBC: 5.6 10*3/uL (ref 3.8–10.8)

## 2020-08-19 LAB — HEPATITIS C ANTIBODY
Hepatitis C Ab: NONREACTIVE
SIGNAL TO CUT-OFF: 0.01 (ref <1.00)

## 2020-08-19 LAB — LIPID PANEL
Cholesterol: 180 mg/dL (ref <200)
HDL: 64 mg/dL (ref >=50)
LDL Cholesterol (Calc): 95 mg/dL (calc)
Non-HDL Cholesterol (Calc): 116 mg/dL (calc) (ref <130)
Total CHOL/HDL Ratio: 2.8 (calc) (ref <5.0)
Triglycerides: 116 mg/dL (ref <150)

## 2020-08-19 LAB — HIV ANTIBODY (ROUTINE TESTING W REFLEX): HIV 1&2 Ab, 4th Generation: NONREACTIVE

## 2020-08-19 LAB — VITAMIN D 25 HYDROXY (VIT D DEFICIENCY, FRACTURES): Vit D, 25-Hydroxy: 50 ng/mL (ref 30–100)

## 2020-08-19 LAB — TSH: TSH: 4.24 mIU/L

## 2020-09-27 ENCOUNTER — Other Ambulatory Visit: Payer: Self-pay

## 2020-09-27 ENCOUNTER — Encounter: Payer: Self-pay | Admitting: Physician Assistant

## 2020-09-27 ENCOUNTER — Telehealth: Payer: Self-pay

## 2020-09-27 ENCOUNTER — Telehealth (INDEPENDENT_AMBULATORY_CARE_PROVIDER_SITE_OTHER): Payer: 59 | Admitting: Physician Assistant

## 2020-09-27 VITALS — Temp 97.3°F | Ht 64.0 in | Wt 160.0 lb

## 2020-09-27 DIAGNOSIS — Z20822 Contact with and (suspected) exposure to covid-19: Secondary | ICD-10-CM

## 2020-09-27 NOTE — Progress Notes (Signed)
Virtual Visit via Video   I connected with Jamie Miller on 09/27/20 at  9:00 AM EST by a video enabled telemedicine application and verified that I am speaking with the correct person using two identifiers. Location patient: Home Location provider: [facility], Office Persons participating in the virtual visit: Jamie, Wiswell PA-C, Anselmo Pickler, LPN   I discussed the limitations of evaluation and management by telemedicine and the availability of in person appointments. The patient expressed understanding and agreed to proceed.  I acted as a Education administrator for [employer], PA-C [employer], LPN   Subjective:   HPI:   Patient is requesting evaluation for possible COVID-19.  Symptom onset: Thursday 01/20.  Travel/contacts: exposed  Daughter was positive week before for COVID  Vaccination status: 2 shots  Testing results: None  Patient endorses the following symptoms: Sore throat, watery eyes, runny nose, cough and congestion.  Fever or Saturday, (101.6 on Saturday) none since.  Chest pain -- this started last night when having bad coughing fits. This then went away when she went to sleep last night. Chest pain again this morning, pt has anxiety and when she distracts herself her chest pain is gone. Chest pain is reproducible to touch. Denies: SOB, radiation of pain, lightheadedness, dizziness.  Patient denies the following symptoms: Headache  Treatments tried: Tylenol, Claritin, Flonase  Patient risk factors: Current JHERD-40 risk of complications score: 0 Smoking status: TASHEA OTHMAN  reports that she has never smoked. She has never used smokeless tobacco. If female, currently pregnant? []   Yes [x]   No  ROS: See pertinent positives and negatives per HPI.  Patient Active Problem List   Diagnosis Date Noted  . Allergy to environmental factors 09/25/2019  . Spondylosis of lumbosacral region without myelopathy or radiculopathy 09/07/2017  . Generalized  abdominal pain 10/11/2016  . Dyspareunia in female 08/25/2015  . Vitamin D deficiency 04/16/2013  . H/O: hypothyroidism 11/14/2012    Social History   Tobacco Use  . Smoking status: Never Smoker  . Smokeless tobacco: Never Used  Substance Use Topics  . Alcohol use: Not Currently    Current Outpatient Medications:  .  cholecalciferol (VITAMIN D3) 25 MCG (1000 UNIT) tablet, , Disp: , Rfl:  .  fluticasone (FLONASE) 50 MCG/ACT nasal spray, Place 1 spray into both nostrils daily., Disp: , Rfl:  .  loratadine-pseudoephedrine (CLARITIN-D 24 HOUR) 10-240 MG 24 hr tablet, Claritin-D 24 Hour, Disp: , Rfl:  .  MULTIPLE VITAMIN PO, Take 1 tablet by mouth daily., Disp: , Rfl:  .  Probiotic Product (ACIDOPHILUS/GOAT MILK) CAPS, Take by mouth., Disp: , Rfl:   Allergies  Allergen Reactions  . Dairy Aid  [Lactase] Rash  . Eggs Or Egg-Derived Products Rash  . Hemp Seed Oil  [Dronabinol] Rash    itching  . Levothyroxine     Other reaction(s): Other Tingling in legs; heart palpitation  . Metrogel [Metronidazole]     Other reaction(s): Other Cramps: Metrogel  Can tolerate Tinidazole   . Nitrofurantoin Monohyd Macro Diarrhea    Other reaction(s): Other Cramps  . Strawberry (Diagnostic) Rash    itching  . Wheat Bran Rash  . Almond Oil   . Keflex [Cephalexin]   . Nickel   . Other     Sensitive to all abx   . Clindamycin     Other reaction(s): Abdominal Pain  . Clindamycin Hcl Other (See Comments)  . Cranberry     Other reaction(s): Abdominal Pain  Objective:   VITALS: Per patient if applicable, see vitals. GENERAL: Alert, appears well and in no acute distress. HEENT: Atraumatic, conjunctiva clear, no obvious abnormalities on inspection of external nose and ears. NECK: Normal movements of the head and neck. CARDIOPULMONARY: No increased WOB. Speaking in clear sentences. I:E ratio WNL.  MS: Moves all visible extremities without noticeable abnormality. PSYCH: Pleasant and  cooperative, well-groomed. Speech normal rate and rhythm. Affect is appropriate. Insight and judgement are appropriate. Attention is focused, linear, and appropriate.  NEURO: CN grossly intact. Oriented as arrived to appointment on time with no prompting. Moves both UE equally.  SKIN: No obvious lesions, wounds, erythema, or cyanosis noted on face or hands.  Assessment and Plan:   Cozy was seen today for covid symptoms.  Diagnoses and all orders for this visit:  Suspected COVID-19 virus infection   No red flags on discussion, patient is not in any obvious distress during our visit. Discussed progression of most viral illnesses, and recommended supportive care at this point in time.  We reviewed her chest pain symptoms and given that it is reproducible with palpation, goes away if she distracts herself, has no exertional or other symptomatic component, low risk for ACS. However, she was strongly encouraged to go to the ER if symptoms change in any way.   I recommended consideration of COVID testing at this time, but she declines.  Discussed over the counter supportive care options, including Tylenol 500 mg q 8 hours, with recommendations to push fluids and rest. Reviewed return precautions including new/worsening fever, SOB, new/worsening cough, sudden onset changes of symptoms. Recommended need to self-quarantine and practice social distancing until symptoms resolve. I recommend that patient follow-up if symptoms worsen or persist despite treatment x 7-10 days, sooner if needed.  I discussed the assessment and treatment plan with the patient. The patient was provided an opportunity to ask questions and all were answered. The patient agreed with the plan and demonstrated an understanding of the instructions.   The patient was advised to call back or seek an in-person evaluation if the symptoms worsen or if the condition fails to improve as anticipated.   CMA or LPN served as scribe during  this visit. History, Physical, and Plan performed by medical provider. The above documentation has been reviewed and is accurate and complete.  Time spent with patient today was 30 minutes which consisted of chart review, discussing diagnosis, work up, treatment answering questions and documentation.   [place], [place] 09/27/2020

## 2020-09-27 NOTE — Telephone Encounter (Signed)
Nurse Assessment Nurse: Chestine Spore, RN, [place] Date/Time (Eastern Time): 09/27/2020 8:38:47 AM Confirm and document reason for call. If symptomatic, describe symptoms. ---caller states having chest pain. pain at 8-9 (0-10 pain scale). pain started last night. cold like s/s Does the patient have any new or worsening symptoms? ---Yes Will a triage be completed? ---Yes Related visit to physician within the last 2 weeks? ---No Does the PT have any chronic conditions? (i.e. diabetes, asthma, this includes High risk factors for pregnancy, etc.) ---No Is the patient pregnant or possibly pregnant? (Ask all females between the ages of 70-55) ---No Is this a behavioral health or substance abuse call? ---No Guidelines Guideline Title Affirmed Question Affirmed Notes Nurse Date/Time (Eastern Time) Chest Pain [1] Chest pain lasts > 5 minutes AND [2] age > 10 Chestine Spore, RN, [place] 09/27/2020 8:41:22 AM Disp. Time Eilene Ghazi Time) Disposition Final User 09/27/2020 8:35:15 AM Send to Urgent Henriette Combs 09/27/2020 8:46:57 AM 911 Outcome Documentation Matherly, RN, Kimberley Reason: declined 911 09/27/2020 8:45:52 AM Call EMS 911 Now Yes Chestine Spore, RN, [place] PLEASE NOTE: All timestamps contained within this report are represented as [place] Standard Time. CONFIDENTIALTY NOTICE: This fax transmission is intended only for the addressee. It contains information that is legally privileged, confidential or otherwise protected from use or disclosure. If you are not the intended recipient, you are strictly prohibited from reviewing, disclosing, copying using or disseminating any of this information or taking any action in reliance on or regarding this information. If you have received this fax in error, please notify us immediately by telephone so that we can arrange for its return to [place]. Phone: [phone], Toll-Free: [phone], Fax: [phone] Page: 2 of 2 Call Id: [id number] [place]  Disagree/Comply Disagree Caller Understands Yes PreDisposition Call Doctor Care Advice Given Per Guideline CALL EMS 911 NOW: * Immediate medical attention is needed. You need to hang up and call 911 (or an ambulance). CARE ADVICE given per Chest Pain (Adult) guideline. Comments User: Cheri Kearns, RN Date/Time Eilene Ghazi Time): 09/27/2020 8:47:43 AM caller states will keep virtual appt scheduled for 9 AM User: Cheri Kearns, RN Date/Time Eilene Ghazi Time): 09/27/2020 8:49:19 AM caller states pain does move in chest. entire chest and will at times move to only left side. present starting last night continuously User: Cheri Kearns, RN Date/Time Eilene Ghazi Time): 09/27/2020 8:52:43 AM spoke to backline rep and reported triage results. faxed report to MD office for review Referrals GO TO [place]

## 2020-09-28 NOTE — Telephone Encounter (Signed)
See message.

## 2020-11-01 LAB — HM COLONOSCOPY

## 2021-01-10 ENCOUNTER — Encounter: Payer: Self-pay | Admitting: Physician Assistant

## 2021-01-18 ENCOUNTER — Other Ambulatory Visit (HOSPITAL_BASED_OUTPATIENT_CLINIC_OR_DEPARTMENT_OTHER): Payer: Self-pay

## 2021-01-18 ENCOUNTER — Ambulatory Visit (INDEPENDENT_AMBULATORY_CARE_PROVIDER_SITE_OTHER): Payer: 59 | Admitting: Obstetrics & Gynecology

## 2021-01-18 ENCOUNTER — Encounter (HOSPITAL_BASED_OUTPATIENT_CLINIC_OR_DEPARTMENT_OTHER): Payer: Self-pay | Admitting: Obstetrics & Gynecology

## 2021-01-18 ENCOUNTER — Other Ambulatory Visit: Payer: Self-pay

## 2021-01-18 ENCOUNTER — Other Ambulatory Visit (HOSPITAL_BASED_OUTPATIENT_CLINIC_OR_DEPARTMENT_OTHER)
Admission: RE | Admit: 2021-01-18 | Discharge: 2021-01-18 | Disposition: A | Discharge status: Home / Self Care | Payer: 59 | Source: Ambulatory Visit | Attending: Obstetrics & Gynecology | Admitting: Obstetrics & Gynecology

## 2021-01-18 ENCOUNTER — Telehealth (HOSPITAL_BASED_OUTPATIENT_CLINIC_OR_DEPARTMENT_OTHER): Payer: Self-pay

## 2021-01-18 VITALS — BP 148/91 | HR 96 | Wt 160.0 lb

## 2021-01-18 DIAGNOSIS — R8271 Bacteriuria: Secondary | ICD-10-CM | POA: Diagnosis not present

## 2021-01-18 DIAGNOSIS — Z1231 Encounter for screening mammogram for malignant neoplasm of breast: Secondary | ICD-10-CM

## 2021-01-18 DIAGNOSIS — R350 Frequency of micturition: Secondary | ICD-10-CM | POA: Insufficient documentation

## 2021-01-18 LAB — POCT URINALYSIS DIPSTICK
Bilirubin, UA: NEGATIVE
Blood, UA: NEGATIVE
Glucose, UA: NEGATIVE
Ketones, UA: NEGATIVE
Nitrite, UA: NEGATIVE
Protein, UA: NEGATIVE
Spec Grav, UA: 1.02 (ref 1.010–1.025)
Urobilinogen, UA: 0.2 E.U./dL
pH, UA: 5 (ref 5.0–8.0)

## 2021-01-18 NOTE — Telephone Encounter (Signed)
Called patient in reference to e-mail sent. We would like to set up an appointment for patient to come in to be evaluated. tbw

## 2021-01-19 ENCOUNTER — Other Ambulatory Visit (HOSPITAL_BASED_OUTPATIENT_CLINIC_OR_DEPARTMENT_OTHER): Payer: 59

## 2021-01-20 NOTE — Progress Notes (Signed)
GYNECOLOGY  VISIT  CC:   Bladder spasm  HPI: 50 y.o. G42P0101 Married White or Caucasian female here for complaint of worsening bladder spasms.  Has been under a lot of stress with death of several family members and having to handle estates.  Still has two that are not complete.  Feels when stressors are worse, spasms are worse.  Mild increased frequency.  No real dysuria, back pain, pelvic pain.  No vaginal discharge.  Cycles are much less frequent.  Does have some occasional hot flashes.  Pt seeing therapist.  Does have anxiety.  Is considering treatment but not yet.  Has been doing pelvic PT and this helps.  GYNECOLOGIC HISTORY: No LMP recorded. (Menstrual status: Perimenopausal). Contraception: none Menopausal hormone therapy: none  Patient Active Problem List   Diagnosis Date Noted  . Allergy to environmental factors 09/25/2019  . Spondylosis of lumbosacral region without myelopathy or radiculopathy 09/07/2017  . Generalized abdominal pain 10/11/2016  . Dyspareunia in female 08/25/2015  . Vitamin D deficiency 04/16/2013  . H/O: hypothyroidism 11/14/2012    Past Medical History:  Diagnosis Date  . Abnormal Pap smear of cervix    in 20's  . Allergy   . Anxiety   . Asthma    outgrown  . Bladder spasms 10/2017   doing pelvic floor PT  . Chicken pox   . OAB (overactive bladder)   . Thyroid disease    was on low dose synthroid at one time, but removed and levels have been "normal" since and followed thorugh GYN  . Vitamin D deficiency 07/13/2019    Past Surgical History:  Procedure Laterality Date  . APPENDECTOMY  1990  . BREAST BIOPSY  2000   biopsy  . CERVICAL CERCLAGE  2009  . HYMENECTOMY  2005  . WISDOM TOOTH EXTRACTION      MEDS:   Current Outpatient Medications on File Prior to Visit  Medication Sig Dispense Refill  . fluticasone (FLONASE) 50 MCG/ACT nasal spray Place 1 spray into both nostrils daily.    Marland Kitchen loratadine-pseudoephedrine (CLARITIN-D 24 HOUR)  10-240 MG 24 hr tablet Claritin-D 24 Hour    . MULTIPLE VITAMIN PO Take 1 tablet by mouth daily.    . Omega-3 Fatty Acids (FISH OIL) 1000 MG CAPS Take by mouth.    . Probiotic Product (ACIDOPHILUS/GOAT MILK) CAPS Take by mouth.     No current facility-administered medications on file prior to visit.    ALLERGIES: Dairy aid  [lactase], Eggs or egg-derived products, Hemp seed oil  [dronabinol], Levothyroxine, Metrogel [metronidazole], Nitrofurantoin monohyd macro, Strawberry (diagnostic), Wheat bran, Almond oil, Keflex [cephalexin], Nickel, Other, Clindamycin, Clindamycin hcl, and Cranberry  Family History  Problem Relation Age of Onset  . Hyperlipidemia Mother   . Other Mother        fell down a flight of stairs  . Diabetes Father   . Prostate cancer Father        prostate  . Myasthenia gravis Father   . Prostate cancer Maternal Grandfather        prostate  . Prostate cancer Paternal Grandfather        prostate  . Arthritis Maternal Grandmother   . Osteoporosis Maternal Grandmother        possibly  . Lung cancer Paternal Grandmother     SH:  Married, non smoker  Review of Systems  Genitourinary: Positive for frequency. Negative for difficulty urinating and flank pain.    PHYSICAL EXAMINATION:    BP (!) 148/91  Pulse 96   Wt 160 lb (72.6 kg)   BMI 27.46 kg/m     Physical Exam Constitutional:      Appearance: Normal appearance.  Neurological:     General: No focal deficit present.     Mental Status: She is alert.  Psychiatric:        Mood and Affect: Mood normal.    Assessment/Plan: 1. Urinary frequency - POCT Urinalysis Dipstick - Urine Culture; Future - will not treat unless culture is positive.   - continue pelvic PT  2. Encounter for screening mammogram for malignant neoplasm of breast - MM 3D SCREEN BREAST BILATERAL; Future

## 2021-01-25 ENCOUNTER — Ambulatory Visit (HOSPITAL_BASED_OUTPATIENT_CLINIC_OR_DEPARTMENT_OTHER)
Admission: RE | Admit: 2021-01-25 | Discharge: 2021-01-25 | Disposition: A | Discharge status: Home / Self Care | Payer: 59 | Source: Ambulatory Visit | Attending: Obstetrics & Gynecology | Admitting: Obstetrics & Gynecology

## 2021-01-25 ENCOUNTER — Other Ambulatory Visit: Payer: Self-pay

## 2021-01-25 DIAGNOSIS — Z1231 Encounter for screening mammogram for malignant neoplasm of breast: Secondary | ICD-10-CM | POA: Diagnosis present

## 2021-01-26 LAB — URINE CULTURE

## 2021-01-26 NOTE — Addendum Note (Signed)
Addended by: Megan Salon on: 01/26/2021 01:10 PM   Modules accepted: Orders

## 2021-01-27 ENCOUNTER — Other Ambulatory Visit: Payer: Self-pay

## 2021-01-27 ENCOUNTER — Other Ambulatory Visit (HOSPITAL_BASED_OUTPATIENT_CLINIC_OR_DEPARTMENT_OTHER)
Admission: RE | Admit: 2021-01-27 | Discharge: 2021-01-27 | Disposition: A | Discharge status: Home / Self Care | Payer: 59 | Source: Ambulatory Visit | Attending: Obstetrics & Gynecology | Admitting: Obstetrics & Gynecology

## 2021-01-27 DIAGNOSIS — R8271 Bacteriuria: Secondary | ICD-10-CM

## 2021-01-29 LAB — URINE CULTURE

## 2021-02-01 ENCOUNTER — Other Ambulatory Visit (HOSPITAL_BASED_OUTPATIENT_CLINIC_OR_DEPARTMENT_OTHER): Payer: Self-pay | Admitting: *Deleted

## 2021-02-01 MED ORDER — SULFAMETHOXAZOLE-TRIMETHOPRIM 800-160 MG PO TABS: 1.0000 | ORAL_TABLET | Freq: Two times a day (BID) | ORAL | 0 refills | Status: AC

## 2021-02-10 ENCOUNTER — Encounter (HOSPITAL_BASED_OUTPATIENT_CLINIC_OR_DEPARTMENT_OTHER): Payer: Self-pay

## 2021-02-11 ENCOUNTER — Encounter (HOSPITAL_BASED_OUTPATIENT_CLINIC_OR_DEPARTMENT_OTHER): Payer: Self-pay | Admitting: Obstetrics & Gynecology

## 2021-02-11 ENCOUNTER — Other Ambulatory Visit (HOSPITAL_COMMUNITY)
Admission: RE | Admit: 2021-02-11 | Discharge: 2021-02-11 | Disposition: A | Discharge status: Home / Self Care | Payer: 59 | Source: Ambulatory Visit | Attending: Obstetrics & Gynecology | Admitting: Obstetrics & Gynecology

## 2021-02-11 ENCOUNTER — Ambulatory Visit (INDEPENDENT_AMBULATORY_CARE_PROVIDER_SITE_OTHER): Payer: 59 | Admitting: Obstetrics & Gynecology

## 2021-02-11 ENCOUNTER — Other Ambulatory Visit: Payer: Self-pay

## 2021-02-11 VITALS — BP 114/73 | HR 93 | Ht 65.0 in | Wt 167.8 lb

## 2021-02-11 DIAGNOSIS — L292 Pruritus vulvae: Secondary | ICD-10-CM | POA: Diagnosis not present

## 2021-02-11 MED ORDER — FLUCONAZOLE 150 MG PO TABS: ORAL_TABLET | ORAL | 0 refills | Status: DC

## 2021-02-11 MED ORDER — TINIDAZOLE 500 MG PO TABS: ORAL_TABLET | ORAL | 0 refills | Status: DC

## 2021-02-11 NOTE — Progress Notes (Signed)
GYNECOLOGY  VISIT  CC:   vaginal discharge/vulvar irritation  HPI: 50 y.o. G59P0101 Married White or Caucasian female here for worsening vulvar irritation and redness that started on Wednesday.  Having some vaginal discharge.  Denies vaginal bleeding.  Denies odor.  Just feel very moist and irritated.  Denies a little increased urinary urgency.    GYNECOLOGIC HISTORY: No LMP recorded. (Menstrual status: Perimenopausal).  Patient Active Problem List   Diagnosis Date Noted   Allergy to environmental factors 09/25/2019   Spondylosis of lumbosacral region without myelopathy or radiculopathy 09/07/2017   Generalized abdominal pain 10/11/2016   Dyspareunia in female 08/25/2015   Vitamin D deficiency 04/16/2013   H/O: hypothyroidism 11/14/2012    Past Medical History:  Diagnosis Date   Abnormal Pap smear of cervix    in 20's   Allergy    Anxiety    Asthma    outgrown   Bladder spasms 10/2017   doing pelvic floor PT   Chicken pox    OAB (overactive bladder)    Thyroid disease    was on low dose synthroid at one time, but removed and levels have been "normal" since and followed thorugh GYN   Vitamin D deficiency 07/13/2019    Past Surgical History:  Procedure Laterality Date   APPENDECTOMY  1990   BREAST BIOPSY  2000   biopsy   CERVICAL CERCLAGE  2009   HYMENECTOMY  2005   WISDOM TOOTH EXTRACTION      MEDS:   Current Outpatient Medications on File Prior to Visit  Medication Sig Dispense Refill   fluticasone (FLONASE) 50 MCG/ACT nasal spray Place 1 spray into both nostrils daily.     loratadine-pseudoephedrine (CLARITIN-D 24 HOUR) 10-240 MG 24 hr tablet Claritin-D 24 Hour     MULTIPLE VITAMIN PO Take 1 tablet by mouth daily.     Omega-3 Fatty Acids (FISH OIL) 1000 MG CAPS Take by mouth.     Probiotic Product (ACIDOPHILUS/GOAT MILK) CAPS Take by mouth.     No current facility-administered medications on file prior to visit.    ALLERGIES: Dairy aid  [lactase], Eggs or  egg-derived products, Hemp seed oil  [dronabinol], Levothyroxine, Metrogel [metronidazole], Nitrofurantoin monohyd macro, Strawberry (diagnostic), Wheat bran, Almond oil, Keflex [cephalexin], Nickel, Other, Clindamycin, Clindamycin hcl, and Cranberry  Family History  Problem Relation Age of Onset   Hyperlipidemia Mother    Other Mother        fell down a flight of stairs   Diabetes Father    Prostate cancer Father        prostate   Myasthenia gravis Father    Prostate cancer Maternal Grandfather        prostate   Prostate cancer Paternal Grandfather        prostate   Arthritis Maternal Grandmother    Osteoporosis Maternal Grandmother        possibly   Lung cancer Paternal Grandmother     SH:  married, non smoker  Review of Systems  Genitourinary:  Positive for vaginal discharge (and irritation).   PHYSICAL EXAMINATION:    BP 114/73 (BP Location: Right Arm, Patient Position: Sitting, Cuff Size: Large)   Pulse 93   Ht 5\' 5"  (1.651 m) Comment: reported  Wt 167 lb 12.8 oz (76.1 kg)   BMI 27.92 kg/m     General appearance: alert, cooperative and appears stated age Lymph:  no inguinal LAD noted  Pelvic: External genitalia:  no lesions but erythema of inner labia majora  Urethra:  normal appearing urethra with no masses, tenderness or lesions              Bartholins and Skenes: normal                 Vagina: normal appearing vagina but with whitish adherent discharge, no lesions noted              Cervix: no lesions              Bimanual Exam:  Uterus:  normal size, contour, position, consistency, mobility, non-tender              Adnexa: no mass, fullness, tenderness  Chaperone, Octaviano Batty, CMA, was present for exam.  Assessment/Plan: 1. Vulvar itching - Cervicovaginal ancillary only( [place]) - fluconazole (DIFLUCAN) 150 MG tablet; Take 1 tab orally and repeat in 72 hours.  Dispense: 1 tablet; Refill: 0  - if BV positive, will treat with the tinidazole  1 gram daily x 5 days

## 2021-02-12 ENCOUNTER — Other Ambulatory Visit (HOSPITAL_BASED_OUTPATIENT_CLINIC_OR_DEPARTMENT_OTHER): Payer: Self-pay

## 2021-02-12 DIAGNOSIS — L292 Pruritus vulvae: Secondary | ICD-10-CM

## 2021-02-13 LAB — CERVICOVAGINAL ANCILLARY ONLY
Bacterial Vaginitis (gardnerella): POSITIVE — AB
Candida Glabrata: NEGATIVE
Candida Vaginitis: POSITIVE — AB
Comment: NEGATIVE
Comment: NEGATIVE
Comment: NEGATIVE

## 2021-02-14 ENCOUNTER — Ambulatory Visit (HOSPITAL_BASED_OUTPATIENT_CLINIC_OR_DEPARTMENT_OTHER): Payer: Self-pay | Admitting: Obstetrics & Gynecology

## 2021-02-14 ENCOUNTER — Other Ambulatory Visit (HOSPITAL_BASED_OUTPATIENT_CLINIC_OR_DEPARTMENT_OTHER): Payer: Self-pay | Admitting: Obstetrics & Gynecology

## 2021-02-14 ENCOUNTER — Ambulatory Visit (HOSPITAL_BASED_OUTPATIENT_CLINIC_OR_DEPARTMENT_OTHER): Payer: 59 | Admitting: Obstetrics & Gynecology

## 2021-02-14 DIAGNOSIS — L292 Pruritus vulvae: Secondary | ICD-10-CM

## 2021-02-14 MED ORDER — FLUCONAZOLE 150 MG PO TABS: ORAL_TABLET | ORAL | 0 refills | Status: DC

## 2021-02-16 ENCOUNTER — Other Ambulatory Visit (HOSPITAL_BASED_OUTPATIENT_CLINIC_OR_DEPARTMENT_OTHER): Payer: Self-pay | Admitting: Obstetrics & Gynecology

## 2021-02-23 ENCOUNTER — Other Ambulatory Visit: Payer: Self-pay

## 2021-02-23 ENCOUNTER — Other Ambulatory Visit (HOSPITAL_BASED_OUTPATIENT_CLINIC_OR_DEPARTMENT_OTHER)
Admission: RE | Admit: 2021-02-23 | Discharge: 2021-02-23 | Disposition: A | Discharge status: Home / Self Care | Payer: 59 | Source: Ambulatory Visit | Attending: Obstetrics & Gynecology | Admitting: Obstetrics & Gynecology

## 2021-02-23 ENCOUNTER — Other Ambulatory Visit (HOSPITAL_COMMUNITY)
Admission: RE | Admit: 2021-02-23 | Discharge: 2021-02-23 | Disposition: A | Discharge status: Home / Self Care | Payer: 59 | Source: Ambulatory Visit | Attending: Obstetrics & Gynecology | Admitting: Obstetrics & Gynecology

## 2021-02-23 ENCOUNTER — Ambulatory Visit (INDEPENDENT_AMBULATORY_CARE_PROVIDER_SITE_OTHER): Payer: 59 | Admitting: *Deleted

## 2021-02-23 DIAGNOSIS — N898 Other specified noninflammatory disorders of vagina: Secondary | ICD-10-CM | POA: Diagnosis present

## 2021-02-23 DIAGNOSIS — R350 Frequency of micturition: Secondary | ICD-10-CM | POA: Diagnosis not present

## 2021-02-23 LAB — POCT URINALYSIS DIPSTICK
Appearance: NORMAL
Bilirubin, UA: NEGATIVE
Blood, UA: NEGATIVE
Glucose, UA: NEGATIVE
Ketones, UA: NEGATIVE
Nitrite, UA: NEGATIVE
Protein, UA: NEGATIVE
Spec Grav, UA: 1.015 (ref 1.010–1.025)
Urobilinogen, UA: 0.2 E.U./dL
pH, UA: 7.5 (ref 5.0–8.0)

## 2021-02-23 LAB — URINALYSIS, ROUTINE W REFLEX MICROSCOPIC
Bilirubin Urine: NEGATIVE
Glucose, UA: NEGATIVE mg/dL
Hgb urine dipstick: NEGATIVE
Ketones, ur: NEGATIVE mg/dL
Leukocytes,Ua: NEGATIVE
Nitrite: NEGATIVE
Protein, ur: NEGATIVE mg/dL
Specific Gravity, Urine: <1.005 — ABNORMAL LOW (ref 1.005–1.030)
pH: 7.5 (ref 5.0–8.0)

## 2021-02-23 NOTE — Progress Notes (Addendum)
Patient came in today to give an aptima swab sample. Patient also wanted to do a urine dipstick as she is experiencing urinary frequency as well as the itching and discharge.  Attestation of Attending Supervision of CMA/RN: Evaluation and management procedures were performed by the nurse under my supervision and collaboration.  I have reviewed the nursing note and chart, and I agree with the management and plan.  Carolyn L. Harraway-Smith, M.D., Cherlynn June

## 2021-02-24 LAB — CERVICOVAGINAL ANCILLARY ONLY
Bacterial Vaginitis (gardnerella): POSITIVE — AB
Candida Glabrata: NEGATIVE
Candida Vaginitis: NEGATIVE
Comment: NEGATIVE
Comment: NEGATIVE
Comment: NEGATIVE

## 2021-02-25 ENCOUNTER — Other Ambulatory Visit (HOSPITAL_BASED_OUTPATIENT_CLINIC_OR_DEPARTMENT_OTHER): Payer: Self-pay

## 2021-02-25 ENCOUNTER — Encounter (HOSPITAL_BASED_OUTPATIENT_CLINIC_OR_DEPARTMENT_OTHER): Payer: Self-pay

## 2021-02-25 ENCOUNTER — Other Ambulatory Visit: Payer: Self-pay | Admitting: Obstetrics & Gynecology

## 2021-02-25 DIAGNOSIS — B9689 Other specified bacterial agents as the cause of diseases classified elsewhere: Secondary | ICD-10-CM

## 2021-02-25 DIAGNOSIS — N760 Acute vaginitis: Secondary | ICD-10-CM

## 2021-02-25 DIAGNOSIS — L292 Pruritus vulvae: Secondary | ICD-10-CM

## 2021-02-25 MED ORDER — TINIDAZOLE 500 MG PO TABS
2.0000 g | ORAL_TABLET | Freq: Every day | ORAL | 2 refills | Status: DC
Start: 2021-02-25 — End: 2021-03-08

## 2021-02-25 NOTE — Telephone Encounter (Signed)
Will you take a look at results and advise recommendations for this patient?

## 2021-02-28 NOTE — Telephone Encounter (Signed)
Please advise. tbw

## 2021-03-04 ENCOUNTER — Other Ambulatory Visit (HOSPITAL_BASED_OUTPATIENT_CLINIC_OR_DEPARTMENT_OTHER): Payer: 59

## 2021-03-04 ENCOUNTER — Encounter (HOSPITAL_BASED_OUTPATIENT_CLINIC_OR_DEPARTMENT_OTHER): Payer: Self-pay | Admitting: Obstetrics & Gynecology

## 2021-03-04 ENCOUNTER — Other Ambulatory Visit: Payer: Self-pay

## 2021-03-04 ENCOUNTER — Ambulatory Visit (INDEPENDENT_AMBULATORY_CARE_PROVIDER_SITE_OTHER): Payer: 59 | Admitting: Obstetrics & Gynecology

## 2021-03-04 ENCOUNTER — Other Ambulatory Visit (HOSPITAL_COMMUNITY)
Admission: RE | Admit: 2021-03-04 | Discharge: 2021-03-04 | Disposition: A | Discharge status: Home / Self Care | Payer: 59 | Source: Ambulatory Visit | Attending: Obstetrics & Gynecology | Admitting: Obstetrics & Gynecology

## 2021-03-04 VITALS — BP 115/50 | HR 79 | Ht 65.0 in | Wt 165.8 lb

## 2021-03-04 DIAGNOSIS — N9089 Other specified noninflammatory disorders of vulva and perineum: Secondary | ICD-10-CM | POA: Insufficient documentation

## 2021-03-04 DIAGNOSIS — R3915 Urgency of urination: Secondary | ICD-10-CM | POA: Diagnosis not present

## 2021-03-04 DIAGNOSIS — L292 Pruritus vulvae: Secondary | ICD-10-CM | POA: Diagnosis not present

## 2021-03-04 MED ORDER — FLUCONAZOLE 150 MG PO TABS: ORAL_TABLET | ORAL | 0 refills | Status: DC

## 2021-03-04 NOTE — Progress Notes (Signed)
GYNECOLOGY  VISIT  CC:   recheck  HPI: 50 y.o. G75P0101 Married White or Caucasian female here for complaint of vaginal moisture.  She reports her irritation is much better.  There is just moisture at this point.  Itching is improved.  Denies vaginal bleeding.  Urinalysis was done 6/22 due to urinary frequency.  Reviewed results with pt today.  She is going to restart her physical therapy.  She's going to let me know if this continued after starting PT.  We have discussed medication treatment for urinary urgency/frequency with myrbetriq.    Patient Active Problem List   Diagnosis Date Noted   Allergy to environmental factors 09/25/2019   Spondylosis of lumbosacral region without myelopathy or radiculopathy 09/07/2017   Generalized abdominal pain 10/11/2016   Dyspareunia in female 08/25/2015   Vitamin D deficiency 04/16/2013   H/O: hypothyroidism 11/14/2012    Past Medical History:  Diagnosis Date   Abnormal Pap smear of cervix    in 20's   Allergy    Anxiety    Asthma    outgrown   Bladder spasms 10/2017   doing pelvic floor PT   Chicken pox    OAB (overactive bladder)    Thyroid disease    was on low dose synthroid at one time, but removed and levels have been "normal" since and followed thorugh GYN   Vitamin D deficiency 07/13/2019    Past Surgical History:  Procedure Laterality Date   APPENDECTOMY  1990   BREAST BIOPSY  2000   biopsy   CERVICAL CERCLAGE  2009   HYMENECTOMY  2005   WISDOM TOOTH EXTRACTION      MEDS:   Current Outpatient Medications on File Prior to Visit  Medication Sig Dispense Refill   fluconazole (DIFLUCAN) 150 MG tablet Take 1 tab orally and repeat in 72 hours. 2 tablet 0   fluticasone (FLONASE) 50 MCG/ACT nasal spray Place 1 spray into both nostrils daily.     loratadine-pseudoephedrine (CLARITIN-D 24 HOUR) 10-240 MG 24 hr tablet Claritin-D 24 Hour     MULTIPLE VITAMIN PO Take 1 tablet by mouth daily.     Omega-3 Fatty Acids (FISH OIL) 1000 MG  CAPS Take by mouth.     Probiotic Product (ACIDOPHILUS/GOAT MILK) CAPS Take by mouth.     tinidazole (TINDAMAX) 500 MG tablet 1 gram po daily x 5 days 10 tablet 0   tinidazole (TINDAMAX) 500 MG tablet Take 4 tablets (2,000 mg total) by mouth daily with breakfast. For two days 8 tablet 2   No current facility-administered medications on file prior to visit.    ALLERGIES: Dairy aid  [lactase], Eggs or egg-derived products, Hemp seed oil  [dronabinol], Levothyroxine, Metrogel [metronidazole], Nitrofurantoin monohyd macro, Strawberry (diagnostic), Wheat bran, Almond oil, Keflex [cephalexin], Nickel, Other, Clindamycin, Clindamycin hcl, and Cranberry  Family History  Problem Relation Age of Onset   Hyperlipidemia Mother    Other Mother        fell down a flight of stairs   Diabetes Father    Prostate cancer Father        prostate   Myasthenia gravis Father    Prostate cancer Maternal Grandfather        prostate   Prostate cancer Paternal Grandfather        prostate   Arthritis Maternal Grandmother    Osteoporosis Maternal Grandmother        possibly   Lung cancer Paternal Grandmother     SH:  married, non  smoker  Review of Systems  Constitutional: Negative.   Genitourinary:  Positive for urgency.   PHYSICAL EXAMINATION:    BP (!) 115/50 (BP Location: Right Arm, Patient Position: Sitting, Cuff Size: Large)   Pulse 79   Ht 5\' 5"  (1.651 m) Comment: reported  Wt 165 lb 12.8 oz (75.2 kg)   BMI 27.59 kg/m     General appearance: alert, cooperative and appears stated age Lymph:  no inguinal LAD noted  Pelvic: External genitalia:  no lesions              Urethra:  normal appearing urethra with no masses, tenderness or lesions              Bartholins and Skenes: normal                 Vagina: normal appearing vagina with normal color and discharge, no lesions              Anus:  no visible lesions  Chaperone, Octaviano Batty, CMA, was present for exam.  Assessment/Plan: 1.  Vulvar irritation - Cervicovaginal ancillary only( [place])  2. Vulvar itching - fluconazole (DIFLUCAN) 150 MG tablet; Take 1 tab orally and repeat in 72 hours.  Dispense: 2 tablet; Refill: 0  3. Urinary urgency - pt is going to restart her PT and give update in a month

## 2021-03-08 ENCOUNTER — Encounter (HOSPITAL_BASED_OUTPATIENT_CLINIC_OR_DEPARTMENT_OTHER): Payer: Self-pay

## 2021-03-08 ENCOUNTER — Other Ambulatory Visit (HOSPITAL_BASED_OUTPATIENT_CLINIC_OR_DEPARTMENT_OTHER): Payer: Self-pay | Admitting: Obstetrics & Gynecology

## 2021-03-08 LAB — CERVICOVAGINAL ANCILLARY ONLY
Bacterial Vaginitis (gardnerella): POSITIVE — AB
Candida Glabrata: NEGATIVE
Candida Vaginitis: NEGATIVE
Comment: NEGATIVE
Comment: NEGATIVE
Comment: NEGATIVE

## 2021-03-08 MED ORDER — NONFORMULARY OR COMPOUNDED ITEM: 0 refills | Status: DC

## 2021-03-08 MED ORDER — TINIDAZOLE 500 MG PO TABS: 500.0000 mg | ORAL_TABLET | Freq: Two times a day (BID) | ORAL | 0 refills | Status: AC

## 2021-04-04 DIAGNOSIS — F408 Other phobic anxiety disorders: Secondary | ICD-10-CM | POA: Diagnosis not present

## 2021-04-04 DIAGNOSIS — F411 Generalized anxiety disorder: Secondary | ICD-10-CM | POA: Diagnosis not present

## 2021-04-04 DIAGNOSIS — F438 Other reactions to severe stress: Secondary | ICD-10-CM | POA: Diagnosis not present

## 2021-04-06 ENCOUNTER — Encounter (HOSPITAL_BASED_OUTPATIENT_CLINIC_OR_DEPARTMENT_OTHER): Payer: Self-pay

## 2021-04-18 DIAGNOSIS — F411 Generalized anxiety disorder: Secondary | ICD-10-CM | POA: Diagnosis not present

## 2021-04-18 DIAGNOSIS — F438 Other reactions to severe stress: Secondary | ICD-10-CM | POA: Diagnosis not present

## 2021-04-18 DIAGNOSIS — F408 Other phobic anxiety disorders: Secondary | ICD-10-CM | POA: Diagnosis not present

## 2021-05-03 DIAGNOSIS — F411 Generalized anxiety disorder: Secondary | ICD-10-CM | POA: Diagnosis not present

## 2021-05-03 DIAGNOSIS — F408 Other phobic anxiety disorders: Secondary | ICD-10-CM | POA: Diagnosis not present

## 2021-05-03 DIAGNOSIS — F438 Other reactions to severe stress: Secondary | ICD-10-CM | POA: Diagnosis not present

## 2021-05-16 DIAGNOSIS — F411 Generalized anxiety disorder: Secondary | ICD-10-CM | POA: Diagnosis not present

## 2021-05-16 DIAGNOSIS — F408 Other phobic anxiety disorders: Secondary | ICD-10-CM | POA: Diagnosis not present

## 2021-05-16 DIAGNOSIS — F438 Other reactions to severe stress: Secondary | ICD-10-CM | POA: Diagnosis not present

## 2021-05-30 DIAGNOSIS — F408 Other phobic anxiety disorders: Secondary | ICD-10-CM | POA: Diagnosis not present

## 2021-05-30 DIAGNOSIS — F411 Generalized anxiety disorder: Secondary | ICD-10-CM | POA: Diagnosis not present

## 2021-05-30 DIAGNOSIS — F438 Other reactions to severe stress: Secondary | ICD-10-CM | POA: Diagnosis not present

## 2021-06-13 DIAGNOSIS — F4381 Prolonged grief disorder: Secondary | ICD-10-CM | POA: Diagnosis not present

## 2021-06-13 DIAGNOSIS — F411 Generalized anxiety disorder: Secondary | ICD-10-CM | POA: Diagnosis not present

## 2021-06-13 DIAGNOSIS — F429 Obsessive-compulsive disorder, unspecified: Secondary | ICD-10-CM | POA: Diagnosis not present

## 2021-07-04 DIAGNOSIS — F429 Obsessive-compulsive disorder, unspecified: Secondary | ICD-10-CM | POA: Diagnosis not present

## 2021-07-04 DIAGNOSIS — F4381 Prolonged grief disorder: Secondary | ICD-10-CM | POA: Diagnosis not present

## 2021-07-04 DIAGNOSIS — F411 Generalized anxiety disorder: Secondary | ICD-10-CM | POA: Diagnosis not present

## 2021-07-12 DIAGNOSIS — S29012A Strain of muscle and tendon of back wall of thorax, initial encounter: Secondary | ICD-10-CM | POA: Diagnosis not present

## 2021-07-12 DIAGNOSIS — S4991XA Unspecified injury of right shoulder and upper arm, initial encounter: Secondary | ICD-10-CM | POA: Diagnosis not present

## 2021-07-12 DIAGNOSIS — S139XXA Sprain of joints and ligaments of unspecified parts of neck, initial encounter: Secondary | ICD-10-CM | POA: Diagnosis not present

## 2021-07-18 DIAGNOSIS — F4381 Prolonged grief disorder: Secondary | ICD-10-CM | POA: Diagnosis not present

## 2021-07-18 DIAGNOSIS — F429 Obsessive-compulsive disorder, unspecified: Secondary | ICD-10-CM | POA: Diagnosis not present

## 2021-07-18 DIAGNOSIS — F411 Generalized anxiety disorder: Secondary | ICD-10-CM | POA: Diagnosis not present

## 2021-08-01 DIAGNOSIS — F411 Generalized anxiety disorder: Secondary | ICD-10-CM | POA: Diagnosis not present

## 2021-08-01 DIAGNOSIS — F429 Obsessive-compulsive disorder, unspecified: Secondary | ICD-10-CM | POA: Diagnosis not present

## 2021-08-01 DIAGNOSIS — F4381 Prolonged grief disorder: Secondary | ICD-10-CM | POA: Diagnosis not present

## 2021-08-15 DIAGNOSIS — F4381 Prolonged grief disorder: Secondary | ICD-10-CM | POA: Diagnosis not present

## 2021-08-15 DIAGNOSIS — F429 Obsessive-compulsive disorder, unspecified: Secondary | ICD-10-CM | POA: Diagnosis not present

## 2021-08-15 DIAGNOSIS — F411 Generalized anxiety disorder: Secondary | ICD-10-CM | POA: Diagnosis not present

## 2021-09-12 DIAGNOSIS — F429 Obsessive-compulsive disorder, unspecified: Secondary | ICD-10-CM | POA: Diagnosis not present

## 2021-09-12 DIAGNOSIS — F411 Generalized anxiety disorder: Secondary | ICD-10-CM | POA: Diagnosis not present

## 2021-09-12 DIAGNOSIS — Z0000 Encounter for general adult medical examination without abnormal findings: Secondary | ICD-10-CM | POA: Diagnosis not present

## 2021-09-12 DIAGNOSIS — F4381 Prolonged grief disorder: Secondary | ICD-10-CM | POA: Diagnosis not present

## 2021-10-03 DIAGNOSIS — Z0000 Encounter for general adult medical examination without abnormal findings: Secondary | ICD-10-CM | POA: Diagnosis not present

## 2021-10-03 DIAGNOSIS — Z13220 Encounter for screening for lipoid disorders: Secondary | ICD-10-CM | POA: Diagnosis not present

## 2021-10-13 ENCOUNTER — Encounter (HOSPITAL_BASED_OUTPATIENT_CLINIC_OR_DEPARTMENT_OTHER): Payer: Self-pay | Admitting: Obstetrics & Gynecology

## 2021-10-13 DIAGNOSIS — F4381 Prolonged grief disorder: Secondary | ICD-10-CM | POA: Diagnosis not present

## 2021-10-13 DIAGNOSIS — F411 Generalized anxiety disorder: Secondary | ICD-10-CM | POA: Diagnosis not present

## 2021-10-13 DIAGNOSIS — F429 Obsessive-compulsive disorder, unspecified: Secondary | ICD-10-CM | POA: Diagnosis not present

## 2021-10-17 ENCOUNTER — Other Ambulatory Visit (HOSPITAL_COMMUNITY)
Admission: RE | Admit: 2021-10-17 | Discharge: 2021-10-17 | Disposition: A | Discharge status: Home / Self Care | Payer: BC Managed Care – PPO | Source: Ambulatory Visit | Attending: Obstetrics & Gynecology | Admitting: Obstetrics & Gynecology

## 2021-10-17 ENCOUNTER — Ambulatory Visit (INDEPENDENT_AMBULATORY_CARE_PROVIDER_SITE_OTHER): Payer: BC Managed Care – PPO | Admitting: Obstetrics & Gynecology

## 2021-10-17 ENCOUNTER — Encounter (HOSPITAL_BASED_OUTPATIENT_CLINIC_OR_DEPARTMENT_OTHER): Payer: Self-pay | Admitting: Obstetrics & Gynecology

## 2021-10-17 ENCOUNTER — Other Ambulatory Visit: Payer: Self-pay

## 2021-10-17 VITALS — BP 116/80 | HR 87 | Ht 65.0 in | Wt 169.2 lb

## 2021-10-17 DIAGNOSIS — N898 Other specified noninflammatory disorders of vagina: Secondary | ICD-10-CM | POA: Diagnosis not present

## 2021-10-17 DIAGNOSIS — R3915 Urgency of urination: Secondary | ICD-10-CM | POA: Diagnosis not present

## 2021-10-17 LAB — POCT URINALYSIS DIPSTICK
Appearance: NORMAL
Bilirubin, UA: NEGATIVE
Blood, UA: NEGATIVE
Glucose, UA: NEGATIVE
Ketones, UA: NEGATIVE
Nitrite, UA: NEGATIVE
Protein, UA: NEGATIVE
Spec Grav, UA: 1.01 (ref 1.010–1.025)
Urobilinogen, UA: 0.2 E.U./dL
pH, UA: 5.5 (ref 5.0–8.0)

## 2021-10-17 NOTE — Progress Notes (Signed)
GYNECOLOGY  VISIT  CC:   bladder spasms  HPI: 51 y.o. G45P0101 Married White or Caucasian female here for complaint of increased bladder spasms including urinary urgency over the last two weeks.  Denies dysuria.  Denies blood in her urine.  No low back or pelvic pain.  She has done pelvic PT in the past so started back with the exercises at home and this has helped.  Since restarting the PT exercises, the urinary leakage has significantly improved.  Just having constant urgency.  Is having a little discharge and a little itching.  Urine test showed some white blood cells.    Denies any vaginal bleeding since July.  Denies vaginal dryness.  Patient Active Problem List   Diagnosis Date Noted   Allergy to environmental factors 09/25/2019   Spondylosis of lumbosacral region without myelopathy or radiculopathy 09/07/2017   Generalized abdominal pain 10/11/2016   Dyspareunia in female 08/25/2015   Vitamin D deficiency 04/16/2013   H/O: hypothyroidism 11/14/2012    Past Medical History:  Diagnosis Date   Abnormal Pap smear of cervix    in 20's   Allergy    Anxiety    Asthma    outgrown   Bladder spasms 10/2017   doing pelvic floor PT   Chicken pox    OAB (overactive bladder)    Thyroid disease    was on low dose synthroid at one time, but removed and levels have been "normal" since and followed thorugh GYN   Vitamin D deficiency 07/13/2019    Past Surgical History:  Procedure Laterality Date   APPENDECTOMY  1990   BREAST BIOPSY  2000   biopsy   CERVICAL CERCLAGE  2009   HYMENECTOMY  2005   WISDOM TOOTH EXTRACTION      MEDS:   Current Outpatient Medications on File Prior to Visit  Medication Sig Dispense Refill   loratadine-pseudoephedrine (CLARITIN-D 24 HOUR) 10-240 MG 24 hr tablet Claritin-D 24 Hour     MULTIPLE VITAMIN PO Take 1 tablet by mouth daily.     Probiotic Product (ACIDOPHILUS/GOAT MILK) CAPS Take by mouth.     NONFORMULARY OR COMPOUNDED ITEM Boric acid 600mg   vaginal suppositories.  One PV nightly x 21 days (Patient not taking: Reported on 10/17/2021) 21 each 0   Omega-3 Fatty Acids (FISH OIL) 1000 MG CAPS Take by mouth. (Patient not taking: Reported on 10/17/2021)     No current facility-administered medications on file prior to visit.    ALLERGIES: Dairy aid  [tilactase], Eggs or egg-derived products, Hemp seed oil  [dronabinol], Levothyroxine, Metrogel [metronidazole], Nitrofurantoin monohyd macro, Strawberry (diagnostic), Wheat bran, Almond oil, Keflex [cephalexin], Nickel, Other, Clindamycin, Clindamycin hcl, and Cranberry  Family History  Problem Relation Age of Onset   Hyperlipidemia Mother    Other Mother        fell down a flight of stairs   Diabetes Father    Prostate cancer Father        prostate   Myasthenia gravis Father    Prostate cancer Maternal Grandfather        prostate   Prostate cancer Paternal Grandfather        prostate   Arthritis Maternal Grandmother    Osteoporosis Maternal Grandmother        possibly   Lung cancer Paternal Grandmother     SH:  married, non smoker  Review of Systems  Genitourinary:  Positive for urgency.  All other systems reviewed and are negative.  PHYSICAL EXAMINATION:  BP 116/80    Pulse 87    Ht 5\' 5"  (1.651 m)    Wt 169 lb 3.2 oz (76.7 kg)    BMI 28.16 kg/m     General appearance: alert, cooperative and appears stated age Abdomen: soft, non-tender; bowel sounds normal; no masses,  no organomegaly Lymph:  no inguinal LAD noted  Pelvic: External genitalia:  no lesions              Urethra:  normal appearing urethra with no masses, tenderness or lesions              Bartholins and Skenes: normal                 Vagina: normal appearing vagina with normal color and discharge, no lesions              Cervix: no lesions              Bimanual Exam:  Uterus:  normal size, contour, position, consistency, mobility, non-tender              Adnexa: no mass, fullness, tenderness            Chaperone, Ezekiel Ina, RN, was present for exam.  Assessment/Plan: 1. Urinary urgency - POCT urinalysis dipstick - Urine Culture - she is going to restart her PT - Urogyn referral discussed  2. Vaginal discharge - Cervicovaginal ancillary only( [facility])

## 2021-10-18 LAB — CERVICOVAGINAL ANCILLARY ONLY
Bacterial Vaginitis (gardnerella): POSITIVE — AB
Candida Glabrata: NEGATIVE
Candida Vaginitis: NEGATIVE
Comment: NEGATIVE
Comment: NEGATIVE
Comment: NEGATIVE

## 2021-10-19 LAB — URINE CULTURE

## 2021-10-19 MED ORDER — TINIDAZOLE 500 MG PO TABS: ORAL_TABLET | ORAL | 0 refills | Status: DC

## 2021-10-19 NOTE — Addendum Note (Signed)
Addended by: Megan Salon on: 10/19/2021 06:20 AM   Modules accepted: Orders

## 2021-10-21 ENCOUNTER — Encounter (HOSPITAL_BASED_OUTPATIENT_CLINIC_OR_DEPARTMENT_OTHER): Payer: Self-pay | Admitting: Obstetrics & Gynecology

## 2021-10-24 ENCOUNTER — Encounter (HOSPITAL_BASED_OUTPATIENT_CLINIC_OR_DEPARTMENT_OTHER): Payer: Self-pay | Admitting: Obstetrics & Gynecology

## 2021-10-25 ENCOUNTER — Encounter (HOSPITAL_BASED_OUTPATIENT_CLINIC_OR_DEPARTMENT_OTHER): Payer: Self-pay | Admitting: Obstetrics & Gynecology

## 2021-10-25 ENCOUNTER — Ambulatory Visit (HOSPITAL_BASED_OUTPATIENT_CLINIC_OR_DEPARTMENT_OTHER): Payer: BC Managed Care – PPO | Admitting: Obstetrics & Gynecology

## 2021-10-25 ENCOUNTER — Other Ambulatory Visit: Payer: Self-pay

## 2021-10-25 ENCOUNTER — Other Ambulatory Visit (HOSPITAL_COMMUNITY)
Admission: RE | Admit: 2021-10-25 | Discharge: 2021-10-25 | Disposition: A | Discharge status: Home / Self Care | Payer: BC Managed Care – PPO | Source: Ambulatory Visit | Attending: Obstetrics & Gynecology | Admitting: Obstetrics & Gynecology

## 2021-10-25 VITALS — BP 128/84 | HR 83 | Ht 65.0 in | Wt 168.4 lb

## 2021-10-25 DIAGNOSIS — Z8742 Personal history of other diseases of the female genital tract: Secondary | ICD-10-CM | POA: Insufficient documentation

## 2021-10-25 DIAGNOSIS — R350 Frequency of micturition: Secondary | ICD-10-CM

## 2021-10-25 NOTE — Progress Notes (Signed)
GYNECOLOGY  VISIT  CC:   bladder spasm  HPI: 51 y.o. G30P0101 Married White or Caucasian female here for follow up after being treated for BV.  She is still having bladder spasms.  Denies actual dysuria.  She did not have any issues with the Tinidazole.  Is taking a probiotic--fem flora.  Denies vaginal bleeding.  No LMP recorded. (Menstrual status: Perimenopausal).  Patient Active Problem List   Diagnosis Date Noted   Allergy to environmental factors 09/25/2019   Spondylosis of lumbosacral region without myelopathy or radiculopathy 09/07/2017   Generalized abdominal pain 10/11/2016   Dyspareunia in female 08/25/2015   Vitamin D deficiency 04/16/2013   H/O: hypothyroidism 11/14/2012    Past Medical History:  Diagnosis Date   Abnormal Pap smear of cervix    in 20's   Allergy    Anxiety    Asthma    outgrown   Bladder spasms 10/2017   doing pelvic floor PT   Chicken pox    OAB (overactive bladder)    Thyroid disease    was on low dose synthroid at one time, but removed and levels have been "normal" since and followed thorugh GYN   Vitamin D deficiency 07/13/2019    Past Surgical History:  Procedure Laterality Date   APPENDECTOMY  1990   BREAST BIOPSY  2000   biopsy   CERVICAL CERCLAGE  2009   HYMENECTOMY  2005   WISDOM TOOTH EXTRACTION      MEDS:   Current Outpatient Medications on File Prior to Visit  Medication Sig Dispense Refill   loratadine-pseudoephedrine (CLARITIN-D 24 HOUR) 10-240 MG 24 hr tablet Claritin-D 24 Hour     MULTIPLE VITAMIN PO Take 1 tablet by mouth daily.     Omega-3 Fatty Acids (FISH OIL) 1000 MG CAPS Take by mouth.     Probiotic Product (ACIDOPHILUS/GOAT MILK) CAPS Take by mouth.     No current facility-administered medications on file prior to visit.    ALLERGIES: Dairy aid  [tilactase], Eggs or egg-derived products, Hemp seed oil  [dronabinol], Levothyroxine, Metrogel [metronidazole], Nitrofurantoin monohyd macro, Strawberry (diagnostic),  Wheat bran, Almond oil, Keflex [cephalexin], Nickel, Other, Clindamycin, Clindamycin hcl, and Cranberry  Family History  Problem Relation Age of Onset   Hyperlipidemia Mother    Other Mother        fell down a flight of stairs   Diabetes Father    Prostate cancer Father        prostate   Myasthenia gravis Father    Prostate cancer Maternal Grandfather        prostate   Prostate cancer Paternal Grandfather        prostate   Arthritis Maternal Grandmother    Osteoporosis Maternal Grandmother        possibly   Lung cancer Paternal Grandmother     SH:  married, non smoker  Review of Systems  Genitourinary:  Positive for dysuria.  All other systems reviewed and are negative.  PHYSICAL EXAMINATION:    BP 128/84    Pulse 83    Ht 5\' 5"  (1.651 m)    Wt 168 lb 6.4 oz (76.4 kg)    BMI 28.02 kg/m     General appearance: alert, cooperative and appears stated age Lymph:  no inguinal LAD noted  Pelvic: External genitalia:  no lesions              Urethra:  normal appearing urethra with no masses, tenderness or lesions  Bartholins and Skenes: normal                 Vagina: normal appearing vagina with normal color and discharge, no lesions              Chaperone, Octaviano Batty, CMA, was present for exam.  Assessment/Plan: 1. History of vaginitis - Cervicovaginal ancillary only( [facility]) - if this is still positive, will proceed with treatment for recurrent BV with boric acid suppositories, 600mg  PV nightly x 3 weeks.  Pt has used this before and is comfortable with plan.

## 2021-10-25 NOTE — Telephone Encounter (Signed)
Called and spoke with patient. She  will be coming in today at 1:30 per Dr. Sabra Heck. tbw

## 2021-10-26 ENCOUNTER — Ambulatory Visit (HOSPITAL_BASED_OUTPATIENT_CLINIC_OR_DEPARTMENT_OTHER): Payer: BC Managed Care – PPO | Admitting: Obstetrics & Gynecology

## 2021-10-26 DIAGNOSIS — Z8742 Personal history of other diseases of the female genital tract: Secondary | ICD-10-CM | POA: Insufficient documentation

## 2021-10-26 LAB — CERVICOVAGINAL ANCILLARY ONLY
Bacterial Vaginitis (gardnerella): POSITIVE — AB
Comment: NEGATIVE

## 2021-10-26 MED ORDER — NONFORMULARY OR COMPOUNDED ITEM: 0 refills | Status: DC

## 2021-10-27 ENCOUNTER — Encounter (HOSPITAL_BASED_OUTPATIENT_CLINIC_OR_DEPARTMENT_OTHER): Payer: Self-pay | Admitting: Obstetrics & Gynecology

## 2021-10-28 ENCOUNTER — Telehealth: Payer: Self-pay | Admitting: *Deleted

## 2021-10-28 ENCOUNTER — Telehealth: Payer: BC Managed Care – PPO | Admitting: Physician Assistant

## 2021-10-28 DIAGNOSIS — U071 COVID-19: Secondary | ICD-10-CM

## 2021-10-28 MED ORDER — BENZONATATE 100 MG PO CAPS: 100.0000 mg | ORAL_CAPSULE | Freq: Three times a day (TID) | ORAL | 0 refills | Status: DC | PRN

## 2021-10-28 MED ORDER — ALBUTEROL SULFATE HFA 108 (90 BASE) MCG/ACT IN AERS: 2.0000 | INHALATION_SPRAY | Freq: Four times a day (QID) | RESPIRATORY_TRACT | 0 refills | Status: DC | PRN

## 2021-10-28 MED ORDER — MOLNUPIRAVIR EUA 200MG CAPSULE: 4.0000 | ORAL_CAPSULE | Freq: Two times a day (BID) | ORAL | 0 refills | Status: AC

## 2021-10-28 NOTE — Telephone Encounter (Signed)
COVID Companion Questionnaire- Worsening symptom- SOB Patient describes her breathing: having to take deeper breathes in to fill lungs- no shortness of breath at this time.  Patient did have virtual visit today- and was prescribed medications: benzonatate, albuterol and molnupiravir.  Patient advised to fill her Rx prescribed by provider  and follow other advice given:  Please keep well-hydrated and get plenty of rest. Start a saline nasal rinse to flush out your nasal passages. You can use plain Mucinex or Mucinex-DM to help thin congestion. If you have a humidifier, running in the bedroom at night. I want you to start OTC vitamin D3 1000 units daily, vitamin C 1000 mg daily, and a zinc supplement.  Patient advised to use virtual visit platform over the weekend if needed and contact her PCP during the week if needed for changes in status. Continue to fill out COVID Questionnaire.  Reviewed emergent breathing symptoms: shortness of breath at rest, gasping for air and wheezing- these need immediate attention-911/ED A copy of note has been forwarded to patient for her reference

## 2021-10-28 NOTE — Patient Instructions (Addendum)
Jamie Miller, thank you for joining Leeanne Rio, PA-C for today's virtual visit.  While this provider is not your primary care provider (PCP), if your PCP is located in our provider database this encounter information will be shared with them immediately following your visit.  Consent: (Patient) Jamie Miller provided verbal consent for this virtual visit at the beginning of the encounter.  Current Medications:  Current Outpatient Medications:    loratadine-pseudoephedrine (CLARITIN-D 24 HOUR) 10-240 MG 24 hr tablet, Claritin-D 24 Hour, Disp: , Rfl:    MULTIPLE VITAMIN PO, Take 1 tablet by mouth daily., Disp: , Rfl:    NONFORMULARY OR COMPOUNDED ITEM, Boric acid 600mg  vaginal suppositories.  One PV nightly x 3 weeks, Disp: 21 each, Rfl: 0   Probiotic Product (ACIDOPHILUS/GOAT MILK) CAPS, Take by mouth., Disp: , Rfl:    Medications ordered in this encounter:  No orders of the defined types were placed in this encounter.    *If you need refills on other medications prior to your next appointment, please contact your pharmacy*  Follow-Up: Call back or seek an in-person evaluation if the symptoms worsen or if the condition fails to improve as anticipated.  Other Instructions Please keep well-hydrated and get plenty of rest. Start a saline nasal rinse to flush out your nasal passages. You can use plain Mucinex or Mucinex-DM to help thin congestion. If you have a humidifier, running in the bedroom at night. I want you to start OTC vitamin D3 1000 units daily, vitamin C 1000 mg daily, and a zinc supplement. Please take prescribed medications as directed.  You have been enrolled in a MyChart symptom monitoring program. Please answer these questions daily so we can keep track of how you are doing.  You were to quarantine for 5 days from onset of your symptoms.  After day 5, if you have had no fever and you are feeling better, you can end quarantine but need to mask for an additional 5  days. After day 5 if you have a fever or are having significant symptoms, please quarantine for full 10 days.  If you note any worsening of symptoms, any significant shortness of breath or any chest pain, please seek ER evaluation ASAP.  Please do not delay care!  COVID-19: What to Do if You Are Sick If you test positive and are an older adult or someone who is at high risk of getting very sick from COVID-19, treatment may be available. Contact a healthcare provider right away after a positive test to determine if you are eligible, even if your symptoms are mild right now. You can also visit a Test to Treat location and, if eligible, receive a prescription from a provider. Don't delay: Treatment must be started within the first few days to be effective. If you have a fever, cough, or other symptoms, you might have COVID-19. Most people have mild illness and are able to recover at home. If you are sick: Keep track of your symptoms. If you have an emergency warning sign (including trouble breathing), call 911. Steps to help prevent the spread of COVID-19 if you are sick If you are sick with COVID-19 or think you might have COVID-19, follow the steps below to care for yourself and to help protect other people in your home and community. Stay home except to get medical care Stay home. Most people with COVID-19 have mild illness and can recover at home without medical care. Do not leave your home, except to  get medical care. Do not visit public areas and do not go to places where you are unable to wear a mask. Take care of yourself. Get rest and stay hydrated. Take over-the-counter medicines, such as acetaminophen, to help you feel better. Stay in touch with your doctor. Call before you get medical care. Be sure to get care if you have trouble breathing, or have any other emergency warning signs, or if you think it is an emergency. Avoid public transportation, ride-sharing, or taxis if possible. Get  tested If you have symptoms of COVID-19, get tested. While waiting for test results, stay away from others, including staying apart from those living in your household. Get tested as soon as possible after your symptoms start. Treatments may be available for people with COVID-19 who are at risk for becoming very sick. Don't delay: Treatment must be started early to be effective--some treatments must begin within 5 days of your first symptoms. Contact your healthcare provider right away if your test result is positive to determine if you are eligible. Self-tests are one of several options for testing for the virus that causes COVID-19 and may be more convenient than laboratory-based tests and point-of-care tests. Ask your healthcare provider or your local health department if you need help interpreting your test results. You can visit your state, tribal, local, and territorial health department's website to look for the latest local information on testing sites. Separate yourself from other people As much as possible, stay in a specific room and away from other people and pets in your home. If possible, you should use a separate bathroom. If you need to be around other people or animals in or outside of the home, wear a well-fitting mask. Tell your close contacts that they may have been exposed to COVID-19. An infected person can spread COVID-19 starting 48 hours (or 2 days) before the person has any symptoms or tests positive. By letting your close contacts know they may have been exposed to COVID-19, you are helping to protect everyone. See COVID-19 and Animals if you have questions about pets. If you are diagnosed with COVID-19, someone from the health department may call you. Answer the call to slow the spread. Monitor your symptoms Symptoms of COVID-19 include fever, cough, or other symptoms. Follow care instructions from your healthcare provider and local health department. Your local health  authorities may give instructions on checking your symptoms and reporting information. When to seek emergency medical attention Look for emergency warning signs* for COVID-19. If someone is showing any of these signs, seek emergency medical care immediately: Trouble breathing Persistent pain or pressure in the chest New confusion Inability to wake or stay awake Pale, gray, or blue-colored skin, lips, or nail beds, depending on skin tone *This list is not all possible symptoms. Please call your medical provider for any other symptoms that are severe or concerning to you. Call 911 or call ahead to your local emergency facility: Notify the operator that you are seeking care for someone who has or may have COVID-19. Call ahead before visiting your doctor Call ahead. Many medical visits for routine care are being postponed or done by phone or telemedicine. If you have a medical appointment that cannot be postponed, call your doctor's office, and tell them you have or may have COVID-19. This will help the office protect themselves and other patients. If you are sick, wear a well-fitting mask You should wear a mask if you must be around other people or animals,  including pets (even at home). Wear a mask with the best fit, protection, and comfort for you. You don't need to wear the mask if you are alone. If you can't put on a mask (because of trouble breathing, for example), cover your coughs and sneezes in some other way. Try to stay at least 6 feet away from other people. This will help protect the people around you. Masks should not be placed on young children under age 10 years, anyone who has trouble breathing, or anyone who is not able to remove the mask without help. Cover your coughs and sneezes Cover your mouth and nose with a tissue when you cough or sneeze. Throw away used tissues in a lined trash can. Immediately wash your hands with soap and water for at least 20 seconds. If soap and water  are not available, clean your hands with an alcohol-based hand sanitizer that contains at least 60% alcohol. Clean your hands often Wash your hands often with soap and water for at least 20 seconds. This is especially important after blowing your nose, coughing, or sneezing; going to the bathroom; and before eating or preparing food. Use hand sanitizer if soap and water are not available. Use an alcohol-based hand sanitizer with at least 60% alcohol, covering all surfaces of your hands and rubbing them together until they feel dry. Soap and water are the best option, especially if hands are visibly dirty. Avoid touching your eyes, nose, and mouth with unwashed hands. Handwashing Tips Avoid sharing personal household items Do not share dishes, drinking glasses, cups, eating utensils, towels, or bedding with other people in your home. Wash these items thoroughly after using them with soap and water or put in the dishwasher. Clean surfaces in your home regularly Clean and disinfect high-touch surfaces (for example, doorknobs, tables, handles, light switches, and countertops) in your "sick room" and bathroom. In shared spaces, you should clean and disinfect surfaces and items after each use by the person who is ill. If you are sick and cannot clean, a caregiver or other person should only clean and disinfect the area around you (such as your bedroom and bathroom) on an as needed basis. Your caregiver/other person should wait as long as possible (at least several hours) and wear a mask before entering, cleaning, and disinfecting shared spaces that you use. Clean and disinfect areas that may have blood, stool, or body fluids on them. Use household cleaners and disinfectants. Clean visible dirty surfaces with household cleaners containing soap or detergent. Then, use a household disinfectant. Use a product from [employer] List N: Disinfectants for Coronavirus (XNTZG-01). Be sure to follow the instructions on the  label to ensure safe and effective use of the product. Many products recommend keeping the surface wet with a disinfectant for a certain period of time (look at "contact time" on the product label). You may also need to wear personal protective equipment, such as gloves, depending on the directions on the product label. Immediately after disinfecting, wash your hands with soap and water for 20 seconds. For completed guidance on cleaning and disinfecting your home, visit Complete Disinfection Guidance. Take steps to improve ventilation at home Improve ventilation (air flow) at home to help prevent from spreading COVID-19 to other people in your household. Clear out COVID-19 virus particles in the air by opening windows, using air filters, and turning on fans in your home. Use this interactive tool to learn how to improve air flow in your home. When you can be  around others after being sick with COVID-19 Deciding when you can be around others is different for different situations. Find out when you can safely end home isolation. For any additional questions about your care, contact your healthcare provider or state or local health department. 11/23/2020 Content source: [facility] ([facility]), Division of Viral Diseases This information is not intended to replace advice given to you by your health care provider. Make sure you discuss any questions you have with your health care provider. Document Revised: 01/06/2021 Document Reviewed: 01/06/2021 Elsevier Patient Education  2022 [employer].      If you have been instructed to have an in-person evaluation today at a local Urgent Care facility, please use the link below. It will take you to a list of all of our available [facility], including address, phone number and hours of operation. Please do not delay care.  [facility]  If you or a family member do not have a primary  care provider, use the link below to schedule a visit and establish care. When you choose a [facility] primary care physician or advanced practice provider, you gain a long-term partner in health. Find a Primary Care Provider  Learn more about [facility] in-office and virtual care options: [facility]

## 2021-10-28 NOTE — Progress Notes (Signed)
Virtual Visit Consent   Jamie Miller, you are scheduled for a virtual visit with a [facility] provider today.     Just as with appointments in the office, your consent must be obtained to participate.  Your consent will be active for this visit and any virtual visit you may have with one of our providers in the next 365 days.     If you have a MyChart account, a copy of this consent can be sent to you electronically.  All virtual visits are billed to your insurance company just like a traditional visit in the office.    As this is a virtual visit, video technology does not allow for your provider to perform a traditional examination.  This may limit your provider's ability to fully assess your condition.  If your provider identifies any concerns that need to be evaluated in person or the need to arrange testing (such as labs, EKG, etc.), we will make arrangements to do so.     Although advances in technology are sophisticated, we cannot ensure that it will always work on either your end or our end.  If the connection with a video visit is poor, the visit may have to be switched to a telephone visit.  With either a video or telephone visit, we are not always able to ensure that we have a secure connection.     I need to obtain your verbal consent now.   Are you willing to proceed with your visit today?    Jamie Miller has provided verbal consent on 10/28/2021 for a virtual visit (video or telephone).   Jamie Miller, [place]   Date: 10/28/2021 10:45 AM   Virtual Visit via Video Note   I, Jamie Miller, connected with  Jamie Miller  ([id number], 1971/04/30) on 10/28/21 at 10:45 AM EST by a video-enabled telemedicine application and verified that I am speaking with the correct person using two identifiers.  Location: Patient: Virtual Visit Location Patient: Home Provider: Virtual Visit Location Provider: Home Office   I discussed the limitations of evaluation and management by  telemedicine and the availability of in person appointments. The patient expressed understanding and agreed to proceed.    History of Present Illness: Jamie Miller is a 51 y.o. who identifies as a female who was assigned female at birth, and is being seen today for COVID-19. Endorses symptoms starting Tuesday night with mild URI symptoms. Took a COVID test which was negative. Symptoms worsened Wednesday with increased chest congestion, low grade fever and headache. Notes some chest wall tenderness with coughing and occasional chest tightness. Took repeat COVID test yesterday which was positive. Husband also COVID positive.   OTC medications-- Has taken cough drops, Tylenol and Vitamins.    HPI: HPI  Problems:  Patient Active Problem List   Diagnosis Date Noted   History of vaginitis 10/26/2021   Allergy to environmental factors 09/25/2019   Spondylosis of lumbosacral region without myelopathy or radiculopathy 09/07/2017   Generalized abdominal pain 10/11/2016   Dyspareunia in female 08/25/2015   Vitamin D deficiency 04/16/2013   H/O: hypothyroidism 11/14/2012    Allergies:  Allergies  Allergen Reactions   Dairy Aid  [Tilactase] Rash   Eggs Or Egg-Derived Products Rash   Hemp Seed Oil  [Dronabinol] Rash    itching   Levothyroxine     Other reaction(s): Other Tingling in legs; heart palpitation   Metrogel [Metronidazole]     Other reaction(s): Other Cramps:  Metrogel  Can tolerate Tinidazole    Nitrofurantoin Monohyd Macro Diarrhea    Other reaction(s): Other Cramps   Strawberry (Diagnostic) Rash    itching   Wheat Bran Rash   Almond Oil    Keflex [Cephalexin]    Nickel    Other     Sensitive to all abx    Clindamycin     Other reaction(s): Abdominal Pain   Clindamycin Hcl Other (See Comments)   Cranberry     Other reaction(s): Abdominal Pain   Medications:  Current Outpatient Medications:    albuterol (VENTOLIN HFA) 108 (90 Base) MCG/ACT inhaler, Inhale 2 puffs into  the lungs every 6 (six) hours as needed for wheezing or shortness of breath., Disp: 8 g, Rfl: 0   benzonatate (TESSALON) 100 MG capsule, Take 1 capsule (100 mg total) by mouth 3 (three) times daily as needed for cough., Disp: 30 capsule, Rfl: 0   loratadine-pseudoephedrine (CLARITIN-D 24 HOUR) 10-240 MG 24 hr tablet, Claritin-D 24 Hour, Disp: , Rfl:    MULTIPLE VITAMIN PO, Take 1 tablet by mouth daily., Disp: , Rfl:    NONFORMULARY OR COMPOUNDED ITEM, Boric acid 600mg  vaginal suppositories.  One PV nightly x 3 weeks, Disp: 21 each, Rfl: 0   Probiotic Product (ACIDOPHILUS/GOAT MILK) CAPS, Take by mouth., Disp: , Rfl:   Observations/Objective: Patient is well-developed, well-nourished in no acute distress.  Resting comfortably at home.  Head is normocephalic, atraumatic.  No labored breathing. Speech is clear and coherent with logical content.  Patient is alert and oriented at baseline.   Assessment and Plan: 1. COVID-19 - benzonatate (TESSALON) 100 MG capsule; Take 1 capsule (100 mg total) by mouth 3 (three) times daily as needed for cough.  Dispense: 30 capsule; Refill: 0 - albuterol (VENTOLIN HFA) 108 (90 Base) MCG/ACT inhaler; Inhale 2 puffs into the lungs every 6 (six) hours as needed for wheezing or shortness of breath.  Dispense: 8 g; Refill: 0 - MyChart COVID-19 home monitoring program; Future  Milder symptoms. Lower risk of complications - risk score of 0. No indication for antiviral at present time as risk of ADR from medication > likely benefit. After discussion did place Rx Molnupiravir on file at pharmacy to start if any acute worsening of symptoms over the weekend giving her concerns. Supportive measures, OTC medications and Vitamin regimen reviewed. Tessalon and albuterol per orders. Patient enrolled in [place] monitoring program through [facility]. Strict ER precautions reviewed.   Follow Up Instructions: I discussed the assessment and treatment plan with the patient. The patient  was provided an opportunity to ask questions and all were answered. The patient agreed with the plan and demonstrated an understanding of the instructions.  A copy of instructions were sent to the patient via MyChart unless otherwise noted below.   The patient was advised to call back or seek an in-person evaluation if the symptoms worsen or if the condition fails to improve as anticipated.  Time:  I spent 10 minutes with the patient via telehealth technology discussing the above problems/concerns.    Jamie Rio, PA-C

## 2021-11-01 ENCOUNTER — Telehealth: Payer: Self-pay | Admitting: *Deleted

## 2021-11-01 NOTE — Telephone Encounter (Signed)
Called patient to review My Chart covid questionnaire responses. Patient reports cough is no longer productive and is dry. Recommended drinking warm fluids and hard candy or cough drops to treat symptoms. Patient reports appetite is better and she is eating well. C/o headache. Recommended continue fluids and alternate Gatorade. May use Tylenol as directed from manufacturer, not to exceed 4,000 mg in 24 hours. Patient asked if she could take Borax and mucinex and the same time. I recommended patient to contact PCP or pharmacist. Patient verbalized understanding to call back for further questions or concerns. Reports she is feeling better.

## 2021-11-10 DIAGNOSIS — F411 Generalized anxiety disorder: Secondary | ICD-10-CM | POA: Diagnosis not present

## 2021-11-10 DIAGNOSIS — F4381 Prolonged grief disorder: Secondary | ICD-10-CM | POA: Diagnosis not present

## 2021-11-10 DIAGNOSIS — F429 Obsessive-compulsive disorder, unspecified: Secondary | ICD-10-CM | POA: Diagnosis not present

## 2021-11-24 ENCOUNTER — Encounter (HOSPITAL_BASED_OUTPATIENT_CLINIC_OR_DEPARTMENT_OTHER): Payer: Self-pay | Admitting: Obstetrics & Gynecology

## 2021-11-25 ENCOUNTER — Other Ambulatory Visit (HOSPITAL_BASED_OUTPATIENT_CLINIC_OR_DEPARTMENT_OTHER): Payer: Self-pay | Admitting: Obstetrics & Gynecology

## 2021-11-25 DIAGNOSIS — Z8742 Personal history of other diseases of the female genital tract: Secondary | ICD-10-CM

## 2021-11-25 MED ORDER — NONFORMULARY OR COMPOUNDED ITEM: 0 refills | Status: DC

## 2021-11-30 DIAGNOSIS — R799 Abnormal finding of blood chemistry, unspecified: Secondary | ICD-10-CM | POA: Diagnosis not present

## 2021-12-16 ENCOUNTER — Encounter (HOSPITAL_BASED_OUTPATIENT_CLINIC_OR_DEPARTMENT_OTHER): Payer: Self-pay | Admitting: Obstetrics & Gynecology

## 2022-01-19 DIAGNOSIS — F4381 Prolonged grief disorder: Secondary | ICD-10-CM | POA: Diagnosis not present

## 2022-01-19 DIAGNOSIS — F429 Obsessive-compulsive disorder, unspecified: Secondary | ICD-10-CM | POA: Diagnosis not present

## 2022-01-19 DIAGNOSIS — F411 Generalized anxiety disorder: Secondary | ICD-10-CM | POA: Diagnosis not present

## 2022-02-07 DIAGNOSIS — E02 Subclinical iodine-deficiency hypothyroidism: Secondary | ICD-10-CM | POA: Diagnosis not present

## 2022-02-22 ENCOUNTER — Encounter (HOSPITAL_BASED_OUTPATIENT_CLINIC_OR_DEPARTMENT_OTHER): Payer: Self-pay | Admitting: Obstetrics & Gynecology

## 2022-03-16 DIAGNOSIS — F411 Generalized anxiety disorder: Secondary | ICD-10-CM | POA: Diagnosis not present

## 2022-03-16 DIAGNOSIS — F4381 Prolonged grief disorder: Secondary | ICD-10-CM | POA: Diagnosis not present

## 2022-03-16 DIAGNOSIS — F429 Obsessive-compulsive disorder, unspecified: Secondary | ICD-10-CM | POA: Diagnosis not present

## 2022-04-04 ENCOUNTER — Other Ambulatory Visit (HOSPITAL_BASED_OUTPATIENT_CLINIC_OR_DEPARTMENT_OTHER): Payer: Self-pay | Admitting: Certified Registered"

## 2022-04-04 DIAGNOSIS — Z1231 Encounter for screening mammogram for malignant neoplasm of breast: Secondary | ICD-10-CM

## 2022-04-20 DIAGNOSIS — Q828 Other specified congenital malformations of skin: Secondary | ICD-10-CM | POA: Diagnosis not present

## 2022-04-20 DIAGNOSIS — D220 Melanocytic nevi of lip: Secondary | ICD-10-CM | POA: Diagnosis not present

## 2022-04-20 DIAGNOSIS — F4381 Prolonged grief disorder: Secondary | ICD-10-CM | POA: Diagnosis not present

## 2022-04-20 DIAGNOSIS — F429 Obsessive-compulsive disorder, unspecified: Secondary | ICD-10-CM | POA: Diagnosis not present

## 2022-04-20 DIAGNOSIS — F411 Generalized anxiety disorder: Secondary | ICD-10-CM | POA: Diagnosis not present

## 2022-04-20 DIAGNOSIS — D2261 Melanocytic nevi of right upper limb, including shoulder: Secondary | ICD-10-CM | POA: Diagnosis not present

## 2022-04-28 ENCOUNTER — Ambulatory Visit (HOSPITAL_BASED_OUTPATIENT_CLINIC_OR_DEPARTMENT_OTHER)
Admission: RE | Admit: 2022-04-28 | Discharge: 2022-04-28 | Disposition: A | Discharge status: Home / Self Care | Payer: BC Managed Care – PPO | Source: Ambulatory Visit | Attending: Certified Registered" | Admitting: Certified Registered"

## 2022-04-28 DIAGNOSIS — Z1231 Encounter for screening mammogram for malignant neoplasm of breast: Secondary | ICD-10-CM | POA: Diagnosis not present

## 2022-05-12 DIAGNOSIS — F429 Obsessive-compulsive disorder, unspecified: Secondary | ICD-10-CM | POA: Diagnosis not present

## 2022-05-12 DIAGNOSIS — F411 Generalized anxiety disorder: Secondary | ICD-10-CM | POA: Diagnosis not present

## 2022-05-12 DIAGNOSIS — F4381 Prolonged grief disorder: Secondary | ICD-10-CM | POA: Diagnosis not present

## 2022-05-15 ENCOUNTER — Other Ambulatory Visit (HOSPITAL_COMMUNITY)
Admission: RE | Admit: 2022-05-15 | Discharge: 2022-05-15 | Disposition: A | Discharge status: Home / Self Care | Payer: BC Managed Care – PPO | Source: Ambulatory Visit | Attending: Obstetrics & Gynecology | Admitting: Obstetrics & Gynecology

## 2022-05-15 ENCOUNTER — Ambulatory Visit (INDEPENDENT_AMBULATORY_CARE_PROVIDER_SITE_OTHER): Payer: BC Managed Care – PPO | Admitting: Obstetrics & Gynecology

## 2022-05-15 ENCOUNTER — Encounter (HOSPITAL_BASED_OUTPATIENT_CLINIC_OR_DEPARTMENT_OTHER): Payer: Self-pay | Admitting: Obstetrics & Gynecology

## 2022-05-15 VITALS — BP 116/69 | HR 85 | Ht 64.0 in | Wt 172.8 lb

## 2022-05-15 DIAGNOSIS — Z124 Encounter for screening for malignant neoplasm of cervix: Secondary | ICD-10-CM | POA: Diagnosis not present

## 2022-05-15 DIAGNOSIS — R3915 Urgency of urination: Secondary | ICD-10-CM

## 2022-05-15 DIAGNOSIS — Z8742 Personal history of other diseases of the female genital tract: Secondary | ICD-10-CM

## 2022-05-15 DIAGNOSIS — N912 Amenorrhea, unspecified: Secondary | ICD-10-CM | POA: Diagnosis not present

## 2022-05-15 DIAGNOSIS — Z01419 Encounter for gynecological examination (general) (routine) without abnormal findings: Secondary | ICD-10-CM | POA: Diagnosis not present

## 2022-05-15 NOTE — Progress Notes (Signed)
51 y.o. G50P0101 Married White or Caucasian female here for annual exam.  Denies vaginal bleeding.  Hasn't had any for 14 months.    No LMP recorded. (Menstrual status: Perimenopausal).          Sexually active: Yes.    The current method of family planning is post menopausal status.    Exercising: walking Smoker:  no  Health Maintenance: Pap:  obtained today History of abnormal Pap:  as a teenager once, follow up normal MMG:  04/28/2022 Colonoscopy:  10/2020, follow up 5 year BMD:   not indicated  Screening Labs: done with PCP   reports that she has never smoked. She has never used smokeless tobacco. She reports that she does not currently use alcohol. She reports that she does not use drugs.  Past Medical History:  Diagnosis Date   Abnormal Pap smear of cervix    in 20's   Allergy    Anxiety    Asthma    outgrown   Bladder spasms 10/2017   doing pelvic floor PT   Chicken pox    OAB (overactive bladder)    Thyroid disease    was on low dose synthroid at one time, but removed and levels have been "normal" since and followed thorugh GYN   Vitamin D deficiency 07/13/2019    Past Surgical History:  Procedure Laterality Date   APPENDECTOMY  1990   BREAST BIOPSY  2000   biopsy   CERVICAL CERCLAGE  2009   HYMENECTOMY  2005   WISDOM TOOTH EXTRACTION      Current Outpatient Medications  Medication Sig Dispense Refill   albuterol (VENTOLIN HFA) 108 (90 Base) MCG/ACT inhaler Inhale 2 puffs into the lungs every 6 (six) hours as needed for wheezing or shortness of breath. 8 g 0   benzonatate (TESSALON) 100 MG capsule Take 1 capsule (100 mg total) by mouth 3 (three) times daily as needed for cough. 30 capsule 0   loratadine-pseudoephedrine (CLARITIN-D 24 HOUR) 10-240 MG 24 hr tablet Claritin-D 24 Hour     MULTIPLE VITAMIN PO Take 1 tablet by mouth daily.     NONFORMULARY OR COMPOUNDED ITEM Boric acid '600mg'$  vaginal suppositories PV twice weekly 36 each 0   Probiotic Product  (ACIDOPHILUS/GOAT MILK) CAPS Take by mouth.     No current facility-administered medications for this visit.    Family History  Problem Relation Age of Onset   Hyperlipidemia Mother    Other Mother        fell down a flight of stairs   Diabetes Father    Prostate cancer Father        prostate   Myasthenia gravis Father    Prostate cancer Maternal Grandfather        prostate   Prostate cancer Paternal Grandfather        prostate   Arthritis Maternal Grandmother    Osteoporosis Maternal Grandmother        possibly   Lung cancer Paternal Grandmother     ROS: Constitutional: negative Genitourinary:negative  Exam:   BP 116/69 (BP Location: Left Arm, Patient Position: Sitting, Cuff Size: Large)   Pulse 85   Ht '5\' 4"'$  (1.626 m) Comment: Reported  Wt 172 lb 12.8 oz (78.4 kg)   BMI 29.66 kg/m   Height: '5\' 4"'$  (162.6 cm) (Reported)  General appearance: alert, cooperative and appears stated age Head: Normocephalic, without obvious abnormality, atraumatic Neck: no adenopathy, supple, symmetrical, trachea midline and thyroid normal to inspection and palpation  Lungs: clear to auscultation bilaterally Breasts: normal appearance, no masses or tenderness Heart: regular rate and rhythm Abdomen: soft, non-tender; bowel sounds normal; no masses,  no organomegaly Extremities: extremities normal, atraumatic, no cyanosis or edema Skin: Skin color, texture, turgor normal. No rashes or lesions Lymph nodes: Cervical, supraclavicular, and axillary nodes normal. No abnormal inguinal nodes palpated Neurologic: Grossly normal   Pelvic: External genitalia:  no lesions              Urethra:  normal appearing urethra with no masses, tenderness or lesions              Bartholins and Skenes: normal                 Vagina: normal appearing vagina with normal color and no discharge, no lesions              Cervix: no lesions              Pap taken: Yes.   Bimanual Exam:  Uterus:  normal size,  contour, position, consistency, mobility, non-tender              Adnexa: normal adnexa and no mass, fullness, tenderness               Rectovaginal: Confirms               Anus:  normal sphincter tone, no lesions  Chaperone, Octaviano Batty, CMA, was present for exam.  Assessment/Plan: 1. Well woman exam with routine gynecological exam - Pap smear obtained today - Mammogram 8/25;2023 - Colonoscopy 10/2020, follow up 5 years - Bone mineral density not indicated yet - lab work done done with PCP - vaccines reviewed/updated  2. Cervical cancer screening - Cytology - PAP( [facility])  3. Amenorrhea - Follicle stimulating hormone - Estradiol  4. Urinary urgency - Urine Culture  5. History of vaginitis

## 2022-05-16 LAB — URINE CULTURE

## 2022-05-16 LAB — FOLLICLE STIMULATING HORMONE: FSH: 69.1 m[IU]/mL

## 2022-05-16 LAB — ESTRADIOL: Estradiol: 73.7 pg/mL

## 2022-05-18 ENCOUNTER — Encounter (HOSPITAL_BASED_OUTPATIENT_CLINIC_OR_DEPARTMENT_OTHER): Payer: Self-pay | Admitting: Obstetrics & Gynecology

## 2022-05-22 LAB — CYTOLOGY - PAP
Comment: NEGATIVE
Diagnosis: UNDETERMINED — AB
High risk HPV: NEGATIVE

## 2022-05-23 ENCOUNTER — Encounter (HOSPITAL_BASED_OUTPATIENT_CLINIC_OR_DEPARTMENT_OTHER): Payer: Self-pay | Admitting: Obstetrics & Gynecology

## 2022-05-26 ENCOUNTER — Other Ambulatory Visit (HOSPITAL_BASED_OUTPATIENT_CLINIC_OR_DEPARTMENT_OTHER): Payer: Self-pay | Admitting: Obstetrics & Gynecology

## 2022-05-26 ENCOUNTER — Telehealth (HOSPITAL_BASED_OUTPATIENT_CLINIC_OR_DEPARTMENT_OTHER): Payer: Self-pay | Admitting: Obstetrics & Gynecology

## 2022-05-26 NOTE — Telephone Encounter (Signed)
Called pt in response to [employer].  Explained ASCUS pap smear with neg HR HPV being a normal pap smear, especially considering her pap smear hx.  Has no new sexual partners.  Follow up 3 year guideline reviewed.  Frequency of this pap smear with vaginal estrogen changes in menopause discussed.  Questions answered.  Pt voiced appreciation for phone call.

## 2022-06-16 DIAGNOSIS — F4381 Prolonged grief disorder: Secondary | ICD-10-CM | POA: Diagnosis not present

## 2022-06-16 DIAGNOSIS — F429 Obsessive-compulsive disorder, unspecified: Secondary | ICD-10-CM | POA: Diagnosis not present

## 2022-06-16 DIAGNOSIS — F411 Generalized anxiety disorder: Secondary | ICD-10-CM | POA: Diagnosis not present

## 2022-07-23 ENCOUNTER — Encounter (HOSPITAL_BASED_OUTPATIENT_CLINIC_OR_DEPARTMENT_OTHER): Payer: Self-pay | Admitting: Obstetrics & Gynecology

## 2022-08-10 DIAGNOSIS — F429 Obsessive-compulsive disorder, unspecified: Secondary | ICD-10-CM | POA: Diagnosis not present

## 2022-08-10 DIAGNOSIS — F4381 Prolonged grief disorder: Secondary | ICD-10-CM | POA: Diagnosis not present

## 2022-08-10 DIAGNOSIS — F411 Generalized anxiety disorder: Secondary | ICD-10-CM | POA: Diagnosis not present

## 2022-08-18 ENCOUNTER — Encounter: Payer: Self-pay | Admitting: Physician Assistant

## 2022-08-18 NOTE — Telephone Encounter (Signed)
Noted  

## 2022-08-21 ENCOUNTER — Encounter: Payer: Self-pay | Admitting: Physician Assistant

## 2022-08-21 ENCOUNTER — Ambulatory Visit (INDEPENDENT_AMBULATORY_CARE_PROVIDER_SITE_OTHER): Payer: BC Managed Care – PPO | Admitting: Physician Assistant

## 2022-08-21 VITALS — BP 130/80 | HR 98 | Temp 97.8°F | Ht 64.0 in | Wt 174.5 lb

## 2022-08-21 DIAGNOSIS — J020 Streptococcal pharyngitis: Secondary | ICD-10-CM

## 2022-08-21 LAB — POCT RAPID STREP A (OFFICE): Rapid Strep A Screen: POSITIVE — AB

## 2022-08-21 MED ORDER — AMOXICILLIN 400 MG/5ML PO SUSR: 500.0000 mg | Freq: Two times a day (BID) | ORAL | 0 refills | Status: AC

## 2022-08-21 NOTE — Progress Notes (Signed)
Jamie Miller is a 51 y.o. female here for a new problem.  History of Present Illness:   Chief Complaint  Patient presents with   Cough    Pt c/o cough, head congestion, throat congestion, x 10 days. Denies fever or headaches. COVID test Negative.    Cough Associated symptoms include a sore throat. Pertinent negatives include no chest pain, chills, fever, heartburn, hemoptysis, myalgias, rash or weight loss.   She denies being around anyone sick. She notes her symptoms started with congestion, which went to cough and sore throat. Patient notes her cough got worse last week. She occasionally experiences ear pressure, but not at this visit. She has taken Claritin and FloNase and notes that the FloNase made her cough worse. Patient has also tried salt water gargles for sore throat. Patient did test negative for COVID-19 before this visit. She has taken Mucinex when she previously had COVID-19.  She denies shortness of breath, chest tightness, and wheezing.     Past Medical History:  Diagnosis Date   Abnormal Pap smear of cervix    in 20's   Allergy    Anxiety    Asthma    outgrown   Bladder spasms 10/2017   doing pelvic floor PT   Chicken pox    OAB (overactive bladder)    Thyroid disease    was on low dose synthroid at one time, but removed and levels have been "normal" since and followed thorugh GYN   Vitamin D deficiency 07/13/2019     Social History   Tobacco Use   Smoking status: Never   Smokeless tobacco: Never  Vaping Use   Vaping Use: Never used  Substance Use Topics   Alcohol use: Not Currently   Drug use: Never    Past Surgical History:  Procedure Laterality Date   APPENDECTOMY  1990   BREAST BIOPSY  2000   biopsy   CERVICAL CERCLAGE  2009   HYMENECTOMY  2005   WISDOM TOOTH EXTRACTION      Family History  Problem Relation Age of Onset   Hyperlipidemia Mother    Other Mother        fell down a flight of stairs   Diabetes Father    Prostate cancer  Father        prostate   Myasthenia gravis Father    Prostate cancer Maternal Grandfather        prostate   Prostate cancer Paternal Grandfather        prostate   Arthritis Maternal Grandmother    Osteoporosis Maternal Grandmother        possibly   Lung cancer Paternal Grandmother     Allergies  Allergen Reactions   Dairy Aid  [Tilactase] Rash   Eggs Or Egg-Derived Products Rash   Hemp Seed Oil  [Dronabinol] Rash    itching   Levothyroxine     Other reaction(s): Other Tingling in legs; heart palpitation   Metrogel [Metronidazole]     Other reaction(s): Other Cramps: Metrogel  Can tolerate Tinidazole    Nitrofurantoin Monohyd Macro Diarrhea    Other reaction(s): Other Cramps   Strawberry (Diagnostic) Rash    itching   Wheat Bran Rash   Almond Oil    Keflex [Cephalexin]    Nickel    Other     Sensitive to all abx    Clindamycin     Other reaction(s): Abdominal Pain   Clindamycin Hcl Other (See Comments)   Cranberry  Other reaction(s): Abdominal Pain    Current Medications:   Current Outpatient Medications:    loratadine-pseudoephedrine (CLARITIN-D 24 HOUR) 10-240 MG 24 hr tablet, Claritin-D 24 Hour, Disp: , Rfl:    Magnesium 200 MG TABS, Take 1 tablet by mouth daily in the afternoon., Disp: , Rfl:    MULTIPLE VITAMIN PO, Take 1 tablet by mouth daily., Disp: , Rfl:    Probiotic Product (ACIDOPHILUS/GOAT MILK) CAPS, Take by mouth., Disp: , Rfl:    Review of Systems:   Review of Systems  Constitutional:  Negative for chills, fever, malaise/fatigue and weight loss.  HENT:  Positive for congestion and sore throat. Negative for hearing loss and sinus pain.   Respiratory:  Positive for cough. Negative for hemoptysis.   Cardiovascular:  Negative for chest pain, palpitations, orthopnea, leg swelling and PND.  Gastrointestinal:  Negative for abdominal pain, constipation, diarrhea, heartburn, nausea and vomiting.  Genitourinary:  Negative for dysuria, frequency and  urgency.  Musculoskeletal:  Negative for back pain, myalgias and neck pain.  Skin:  Negative for itching and rash.  Endo/Heme/Allergies:  Negative for polydipsia.  Psychiatric/Behavioral:  Negative for depression. The patient is not nervous/anxious.     Vitals:   Vitals:   08/21/22 0807  BP: 130/80  Pulse: 98  Temp: 97.8 F (36.6 C)  TempSrc: Temporal  SpO2: 96%  Weight: 174 lb 8 oz (79.2 kg)  Height: '5\' 4"'$  (1.626 m)     Body mass index is 29.95 kg/m.  Physical Exam:   Physical Exam Constitutional:      General: She is not in acute distress.    Appearance: Normal appearance. She is not ill-appearing.  HENT:     Head: Normocephalic and atraumatic.     Right Ear: External ear normal.     Left Ear: External ear normal.     Mouth/Throat:     Mouth: Mucous membranes are moist.     Pharynx: Oropharynx is clear. Posterior oropharyngeal erythema present. No pharyngeal swelling.  Eyes:     Extraocular Movements: Extraocular movements intact.     Pupils: Pupils are equal, round, and reactive to light.  Cardiovascular:     Rate and Rhythm: Normal rate and regular rhythm.     Pulses: Normal pulses.     Heart sounds: Normal heart sounds. No murmur heard.    No gallop.  Pulmonary:     Effort: Pulmonary effort is normal. No respiratory distress.     Breath sounds: Normal breath sounds. No wheezing or rales.  Skin:    General: Skin is warm and dry.  Neurological:     Mental Status: She is alert and oriented to person, place, and time.  Psychiatric:        Judgment: Judgment normal.    Results for orders placed or performed in visit on 08/21/22  POCT rapid strep A  Result Value Ref Range   Rapid Strep A Screen Positive (A) Negative     Assessment and Plan:   Sore throat Strep test is positive Oral amoxicillin sent in for patient -- she is requesting liquid Push fluids and rest Follow-up if new/worsening sx  I,Param Shah,acting as a scribe for [employer],  PA.,have documented all relevant documentation on the behalf of [name], PA,as directed by  [name], PA while in the presence of [name], [place].  I, [name], [place], have reviewed all documentation for this visit. The documentation on 08/21/22 for the exam, diagnosis, procedures, and orders are all accurate  and complete.  [name], PA-C

## 2022-08-21 NOTE — Progress Notes (Signed)
Patient ID: Jamie Miller, female    DOB: [birth year], 51 y.o.   MRN: [id number]  No chief complaint on file.   HPI:           Assessment & Plan:     Subjective:    Outpatient Medications Prior to Visit  Medication Sig Dispense Refill  . albuterol (VENTOLIN HFA) 108 (90 Base) MCG/ACT inhaler Inhale 2 puffs into the lungs every 6 (six) hours as needed for wheezing or shortness of breath. 8 g 0  . benzonatate (TESSALON) 100 MG capsule Take 1 capsule (100 mg total) by mouth 3 (three) times daily as needed for cough. 30 capsule 0  . loratadine-pseudoephedrine (CLARITIN-D 24 HOUR) 10-240 MG 24 hr tablet Claritin-D 24 Hour    . MULTIPLE VITAMIN PO Take 1 tablet by mouth daily.    . NONFORMULARY OR COMPOUNDED ITEM Boric acid '600mg'$  vaginal suppositories PV twice weekly 36 each 0  . Probiotic Product (ACIDOPHILUS/GOAT MILK) CAPS Take by mouth.     No facility-administered medications prior to visit.   Past Medical History:  Diagnosis Date  . Abnormal Pap smear of cervix    in 20's  . Allergy   . Anxiety   . Asthma    outgrown  . Bladder spasms 10/2017   doing pelvic floor PT  . Chicken pox   . OAB (overactive bladder)   . Thyroid disease    was on low dose synthroid at one time, but removed and levels have been "normal" since and followed thorugh GYN  . Vitamin D deficiency 07/13/2019   Past Surgical History:  Procedure Laterality Date  . APPENDECTOMY  1990  . BREAST BIOPSY  2000   biopsy  . CERVICAL CERCLAGE  2009  . HYMENECTOMY  2005  . WISDOM TOOTH EXTRACTION     Allergies  Allergen Reactions  . Dairy Aid  [Tilactase] Rash  . Eggs Or Egg-Derived Products Rash  . Hemp Seed Oil  [Dronabinol] Rash    itching  . Levothyroxine     Other reaction(s): Other Tingling in legs; heart palpitation  . Metrogel [Metronidazole]     Other reaction(s): Other Cramps: Metrogel  Can tolerate Tinidazole   . Nitrofurantoin Monohyd Macro Diarrhea    Other reaction(s): Other Cramps   . Strawberry (Diagnostic) Rash    itching  . Wheat Bran Rash  . Almond Oil   . Keflex [Cephalexin]   . Nickel   . Other     Sensitive to all abx   . Clindamycin     Other reaction(s): Abdominal Pain  . Clindamycin Hcl Other (See Comments)  . Cranberry     Other reaction(s): Abdominal Pain      Objective:    Physical Exam Vitals and nursing note reviewed.  Constitutional:      Appearance: Normal appearance. She is ill-appearing.     Interventions: Face mask in place.  HENT:     Right Ear: Tympanic membrane and ear canal normal.     Left Ear: Tympanic membrane and ear canal normal.     Nose:     Right Sinus: Frontal sinus tenderness present.     Left Sinus: Frontal sinus tenderness present.     Mouth/Throat:     Mouth: Mucous membranes are moist.     Pharynx: Posterior oropharyngeal erythema present. No pharyngeal swelling, oropharyngeal exudate or uvula swelling.     Tonsils: No tonsillar exudate or tonsillar abscesses.  Cardiovascular:     Rate and Rhythm:  Normal rate and regular rhythm.  Pulmonary:     Effort: Pulmonary effort is normal.     Breath sounds: Normal breath sounds.  Musculoskeletal:        General: Normal range of motion.  Lymphadenopathy:     Head:     Right side of head: No preauricular or posterior auricular adenopathy.     Left side of head: No preauricular or posterior auricular adenopathy.     Cervical: No cervical adenopathy.  Skin:    General: Skin is warm and dry.  Neurological:     Mental Status: She is alert.  Psychiatric:        Mood and Affect: Mood normal.        Behavior: Behavior normal.  There were no vitals taken for this visit. Wt Readings from Last 3 Encounters:  05/15/22 172 lb 12.8 oz (78.4 kg)  10/25/21 168 lb 6.4 oz (76.4 kg)  10/17/21 169 lb 3.2 oz (76.7 kg)       Jeanie Sewer, NP

## 2022-08-21 NOTE — Patient Instructions (Addendum)
It was great to see you!  You have strep throat.  Take amoxicillin per orders and follow-up if any lingering symptoms.  Take care,  [name] PA-C

## 2022-08-24 ENCOUNTER — Encounter: Payer: Self-pay | Admitting: Physician Assistant

## 2022-08-30 DIAGNOSIS — F411 Generalized anxiety disorder: Secondary | ICD-10-CM | POA: Diagnosis not present

## 2022-08-30 DIAGNOSIS — F429 Obsessive-compulsive disorder, unspecified: Secondary | ICD-10-CM | POA: Diagnosis not present

## 2022-08-30 DIAGNOSIS — F4381 Prolonged grief disorder: Secondary | ICD-10-CM | POA: Diagnosis not present

## 2022-08-31 ENCOUNTER — Encounter: Payer: Self-pay | Admitting: Physician Assistant

## 2022-09-01 ENCOUNTER — Other Ambulatory Visit: Payer: Self-pay | Admitting: Physician Assistant

## 2022-09-01 MED ORDER — FLUCONAZOLE 150 MG PO TABS: 150.0000 mg | ORAL_TABLET | Freq: Once | ORAL | 0 refills | Status: AC

## 2022-09-06 ENCOUNTER — Encounter (HOSPITAL_BASED_OUTPATIENT_CLINIC_OR_DEPARTMENT_OTHER): Payer: Self-pay | Admitting: Obstetrics & Gynecology

## 2022-09-13 DIAGNOSIS — F411 Generalized anxiety disorder: Secondary | ICD-10-CM | POA: Diagnosis not present

## 2022-09-13 DIAGNOSIS — F4381 Prolonged grief disorder: Secondary | ICD-10-CM | POA: Diagnosis not present

## 2022-09-13 DIAGNOSIS — F429 Obsessive-compulsive disorder, unspecified: Secondary | ICD-10-CM | POA: Diagnosis not present

## 2022-09-26 ENCOUNTER — Ambulatory Visit (INDEPENDENT_AMBULATORY_CARE_PROVIDER_SITE_OTHER): Payer: BC Managed Care – PPO

## 2022-09-26 ENCOUNTER — Encounter (HOSPITAL_BASED_OUTPATIENT_CLINIC_OR_DEPARTMENT_OTHER): Payer: Self-pay | Admitting: Obstetrics & Gynecology

## 2022-09-26 DIAGNOSIS — R300 Dysuria: Secondary | ICD-10-CM | POA: Diagnosis not present

## 2022-09-26 LAB — POCT URINALYSIS DIPSTICK
Bilirubin, UA: NEGATIVE
Blood, UA: NEGATIVE
Glucose, UA: NEGATIVE
Ketones, UA: NEGATIVE
Nitrite, UA: NEGATIVE
Protein, UA: NEGATIVE
Spec Grav, UA: 1.015 (ref 1.010–1.025)
Urobilinogen, UA: 0.2 E.U./dL
pH, UA: 7 (ref 5.0–8.0)

## 2022-09-26 MED ORDER — SULFAMETHOXAZOLE-TRIMETHOPRIM 800-160 MG PO TABS: 1.0000 | ORAL_TABLET | Freq: Two times a day (BID) | ORAL | 0 refills | Status: DC

## 2022-09-26 NOTE — Progress Notes (Signed)
Patient came in today to give a urine sample. Patient is complaining of pain while urinating. Sample collected and sent off for urine culture. tbw

## 2022-09-27 DIAGNOSIS — F429 Obsessive-compulsive disorder, unspecified: Secondary | ICD-10-CM | POA: Diagnosis not present

## 2022-09-27 DIAGNOSIS — F4381 Prolonged grief disorder: Secondary | ICD-10-CM | POA: Diagnosis not present

## 2022-09-27 DIAGNOSIS — F411 Generalized anxiety disorder: Secondary | ICD-10-CM | POA: Diagnosis not present

## 2022-09-28 ENCOUNTER — Encounter (HOSPITAL_BASED_OUTPATIENT_CLINIC_OR_DEPARTMENT_OTHER): Payer: Self-pay | Admitting: Obstetrics & Gynecology

## 2022-09-28 DIAGNOSIS — L578 Other skin changes due to chronic exposure to nonionizing radiation: Secondary | ICD-10-CM | POA: Diagnosis not present

## 2022-09-28 DIAGNOSIS — L814 Other melanin hyperpigmentation: Secondary | ICD-10-CM | POA: Diagnosis not present

## 2022-09-28 DIAGNOSIS — L821 Other seborrheic keratosis: Secondary | ICD-10-CM | POA: Diagnosis not present

## 2022-09-28 DIAGNOSIS — D1801 Hemangioma of skin and subcutaneous tissue: Secondary | ICD-10-CM | POA: Diagnosis not present

## 2022-09-28 LAB — URINE CULTURE

## 2022-10-11 DIAGNOSIS — F411 Generalized anxiety disorder: Secondary | ICD-10-CM | POA: Diagnosis not present

## 2022-10-11 DIAGNOSIS — F429 Obsessive-compulsive disorder, unspecified: Secondary | ICD-10-CM | POA: Diagnosis not present

## 2022-10-11 DIAGNOSIS — F4381 Prolonged grief disorder: Secondary | ICD-10-CM | POA: Diagnosis not present

## 2022-10-25 DIAGNOSIS — F429 Obsessive-compulsive disorder, unspecified: Secondary | ICD-10-CM | POA: Diagnosis not present

## 2022-10-25 DIAGNOSIS — F4381 Prolonged grief disorder: Secondary | ICD-10-CM | POA: Diagnosis not present

## 2022-10-25 DIAGNOSIS — F411 Generalized anxiety disorder: Secondary | ICD-10-CM | POA: Diagnosis not present

## 2022-11-08 DIAGNOSIS — F411 Generalized anxiety disorder: Secondary | ICD-10-CM | POA: Diagnosis not present

## 2022-11-08 DIAGNOSIS — F429 Obsessive-compulsive disorder, unspecified: Secondary | ICD-10-CM | POA: Diagnosis not present

## 2022-11-08 DIAGNOSIS — F4381 Prolonged grief disorder: Secondary | ICD-10-CM | POA: Diagnosis not present

## 2022-11-22 DIAGNOSIS — F411 Generalized anxiety disorder: Secondary | ICD-10-CM | POA: Diagnosis not present

## 2022-11-22 DIAGNOSIS — F429 Obsessive-compulsive disorder, unspecified: Secondary | ICD-10-CM | POA: Diagnosis not present

## 2022-11-22 DIAGNOSIS — F4381 Prolonged grief disorder: Secondary | ICD-10-CM | POA: Diagnosis not present

## 2022-12-06 DIAGNOSIS — F4381 Prolonged grief disorder: Secondary | ICD-10-CM | POA: Diagnosis not present

## 2022-12-06 DIAGNOSIS — F429 Obsessive-compulsive disorder, unspecified: Secondary | ICD-10-CM | POA: Diagnosis not present

## 2022-12-06 DIAGNOSIS — F411 Generalized anxiety disorder: Secondary | ICD-10-CM | POA: Diagnosis not present

## 2022-12-20 DIAGNOSIS — F4381 Prolonged grief disorder: Secondary | ICD-10-CM | POA: Diagnosis not present

## 2022-12-20 DIAGNOSIS — F429 Obsessive-compulsive disorder, unspecified: Secondary | ICD-10-CM | POA: Diagnosis not present

## 2022-12-20 DIAGNOSIS — F411 Generalized anxiety disorder: Secondary | ICD-10-CM | POA: Diagnosis not present

## 2023-01-03 DIAGNOSIS — F429 Obsessive-compulsive disorder, unspecified: Secondary | ICD-10-CM | POA: Diagnosis not present

## 2023-01-03 DIAGNOSIS — F411 Generalized anxiety disorder: Secondary | ICD-10-CM | POA: Diagnosis not present

## 2023-01-03 DIAGNOSIS — F4381 Prolonged grief disorder: Secondary | ICD-10-CM | POA: Diagnosis not present

## 2023-01-31 DIAGNOSIS — F429 Obsessive-compulsive disorder, unspecified: Secondary | ICD-10-CM | POA: Diagnosis not present

## 2023-01-31 DIAGNOSIS — F4381 Prolonged grief disorder: Secondary | ICD-10-CM | POA: Diagnosis not present

## 2023-01-31 DIAGNOSIS — F411 Generalized anxiety disorder: Secondary | ICD-10-CM | POA: Diagnosis not present

## 2023-02-28 DIAGNOSIS — F411 Generalized anxiety disorder: Secondary | ICD-10-CM | POA: Diagnosis not present

## 2023-02-28 DIAGNOSIS — F429 Obsessive-compulsive disorder, unspecified: Secondary | ICD-10-CM | POA: Diagnosis not present

## 2023-02-28 DIAGNOSIS — F4381 Prolonged grief disorder: Secondary | ICD-10-CM | POA: Diagnosis not present

## 2023-03-14 DIAGNOSIS — F4381 Prolonged grief disorder: Secondary | ICD-10-CM | POA: Diagnosis not present

## 2023-03-14 DIAGNOSIS — F411 Generalized anxiety disorder: Secondary | ICD-10-CM | POA: Diagnosis not present

## 2023-03-14 DIAGNOSIS — F429 Obsessive-compulsive disorder, unspecified: Secondary | ICD-10-CM | POA: Diagnosis not present

## 2023-03-27 ENCOUNTER — Encounter (HOSPITAL_BASED_OUTPATIENT_CLINIC_OR_DEPARTMENT_OTHER): Payer: Self-pay | Admitting: Obstetrics & Gynecology

## 2023-03-28 DIAGNOSIS — F411 Generalized anxiety disorder: Secondary | ICD-10-CM | POA: Diagnosis not present

## 2023-03-28 DIAGNOSIS — F429 Obsessive-compulsive disorder, unspecified: Secondary | ICD-10-CM | POA: Diagnosis not present

## 2023-04-11 DIAGNOSIS — F4381 Prolonged grief disorder: Secondary | ICD-10-CM | POA: Diagnosis not present

## 2023-04-11 DIAGNOSIS — F429 Obsessive-compulsive disorder, unspecified: Secondary | ICD-10-CM | POA: Diagnosis not present

## 2023-04-11 DIAGNOSIS — F411 Generalized anxiety disorder: Secondary | ICD-10-CM | POA: Diagnosis not present

## 2023-04-25 DIAGNOSIS — F411 Generalized anxiety disorder: Secondary | ICD-10-CM | POA: Diagnosis not present

## 2023-04-25 DIAGNOSIS — F429 Obsessive-compulsive disorder, unspecified: Secondary | ICD-10-CM | POA: Diagnosis not present

## 2023-04-25 DIAGNOSIS — F4381 Prolonged grief disorder: Secondary | ICD-10-CM | POA: Diagnosis not present

## 2023-05-09 DIAGNOSIS — F411 Generalized anxiety disorder: Secondary | ICD-10-CM | POA: Diagnosis not present

## 2023-05-09 DIAGNOSIS — F429 Obsessive-compulsive disorder, unspecified: Secondary | ICD-10-CM | POA: Diagnosis not present

## 2023-05-09 DIAGNOSIS — F4381 Prolonged grief disorder: Secondary | ICD-10-CM | POA: Diagnosis not present

## 2023-06-04 ENCOUNTER — Ambulatory Visit (INDEPENDENT_AMBULATORY_CARE_PROVIDER_SITE_OTHER): Payer: BC Managed Care – PPO | Admitting: Physician Assistant

## 2023-06-04 ENCOUNTER — Encounter: Payer: Self-pay | Admitting: Physician Assistant

## 2023-06-04 VITALS — BP 130/80 | HR 102 | Temp 98.2°F | Ht 64.0 in | Wt 178.5 lb

## 2023-06-04 DIAGNOSIS — R052 Subacute cough: Secondary | ICD-10-CM

## 2023-06-04 NOTE — Patient Instructions (Signed)
It was great to see you!  You have a viral upper respiratory infection. Antibiotics are not needed for this.  Viral infections usually take 7-10 days to resolve.  The cough can last a few weeks to go away.  Continue regular use of over the counter antihistamines such as Zyrtec (cetirizine), Claritin (loratadine), Allegra (fexofenadine), or Xyzal (levocetirizine) daily as needed. May take twice a day if needed as long as it does not cause drowsiness.  Start Nasal saline spray (i.e., Simply Saline) or nasal saline lavage (i.e., NeilMed) is recommended as needed and prior to medicated nasal sprays.  Start Flonase 1 spray twice a day -- generic is FINE!  If nighttime cough, trial 12-hour delsym over the counter cough syrup (generic is fine!)   Push fluids and get plenty of rest. Please return if you are not improving as expected, or if you have high fevers (>101.5) or difficulty swallowing or worsening productive cough.  Call clinic with questions.  I hope you start feeling better soon!    Take care,  Jarold Motto PA-C

## 2023-06-04 NOTE — Progress Notes (Signed)
Jamie Miller is a 52 y.o. female here for a new problem.  History of Present Illness:   Chief Complaint  Patient presents with   Cough    Pt c/o dry cough x 10 days, no fever or chills. COVID test Saturday was Neg.     HPI  Post-Viral Cough: Complains of dry cough, congestion, and fatigue that began 10 days ago.  Endorses improvement of symptoms. Worst symptomatic days were 9/27-9/28, worst symptom: cough.  Reports Negative Covid test on Saturday.  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 Her husband began showing symptoms today.   Notes that she's been using Claritin, Vitamin C, and steam to manage symptoms.   Denies fever, chills, or facial pressure.  Past Medical History:  Diagnosis Date   Abnormal Pap smear of cervix    in 20's   Allergy    Anxiety    Asthma    outgrown   Bladder spasms 10/2017   doing pelvic floor PT   Chicken pox    OAB (overactive bladder)    Thyroid disease    was on low dose synthroid at one time, but removed and levels have been "normal" since and followed thorugh GYN   Vitamin D deficiency 07/13/2019     Social History   Tobacco Use   Smoking status: Never   Smokeless tobacco: Never  Vaping Use   Vaping status: Never Used  Substance Use Topics   Alcohol use: Not Currently   Drug use: Never    Past Surgical History:  Procedure Laterality Date   APPENDECTOMY  1990   BREAST BIOPSY  2000   biopsy   CERVICAL CERCLAGE  2009   HYMENECTOMY  2005   WISDOM TOOTH EXTRACTION      Family History  Problem Relation Age of Onset   Hyperlipidemia Mother    Other Mother        fell down a flight of stairs   Diabetes Father    Prostate cancer Father        prostate   Myasthenia gravis Father    Prostate cancer Maternal Grandfather        prostate   Prostate cancer Paternal Grandfather        prostate   Arthritis Maternal Grandmother    Osteoporosis Maternal Grandmother        possibly   Lung cancer  Paternal Grandmother     Allergies  Allergen Reactions   Dairy Aid  [Tilactase] Rash   Egg-Derived Products Rash   Hemp Seed Oil  [Dronabinol] Rash    itching   Levothyroxine     Other reaction(s): Other Tingling in legs; heart palpitation   Metrogel [Metronidazole]     Other reaction(s): Other Cramps: Metrogel  Can tolerate Tinidazole    Nitrofurantoin Monohyd Macro Diarrhea    Other reaction(s): Other Cramps   Strawberry (Diagnostic) Rash    itching   Wheat Rash   Almond Oil    Keflex [Cephalexin]    Nickel    Other     Sensitive to all abx    Clindamycin     Other reaction(s): Abdominal Pain   Clindamycin Hcl Other (See Comments)   Cranberry     Other reaction(s): Abdominal Pain    Current Medications:   Current Outpatient Medications:    loratadine-pseudoephedrine (CLARITIN-D 24 HOUR) 10-240 MG 24 hr tablet, Claritin-D 24 Hour, Disp: , Rfl:    Magnesium 200 MG TABS, Take 1 tablet  by mouth daily in the afternoon., Disp: , Rfl:    Probiotic Product (ACIDOPHILUS/GOAT MILK) CAPS, Take by mouth., Disp: , Rfl:    Rhubarb (ESTROVEN MENOPAUSE RELIEF) 4 MG TABS, Take 1 tablet by mouth daily in the afternoon., Disp: , Rfl:    VITAMIN D PO, Take 1 capsule by mouth daily in the afternoon., Disp: , Rfl:    Review of Systems:   ROS See pertinent positives and negatives as per the HPI.  Vitals:   Vitals:   06/04/23 1442  BP: 130/80  Pulse: (!) 102  Temp: 98.2 F (36.8 C)  TempSrc: Temporal  SpO2: 98%  Weight: 178 lb 8 oz (81 kg)  Height: 5\' 4"  (1.626 m)     Body mass index is 30.64 kg/m.  Physical Exam:   Physical Exam Vitals and nursing note reviewed.  Constitutional:      General: She is not in acute distress.    Appearance: She is well-developed. She is not ill-appearing or toxic-appearing.  HENT:     Head: Normocephalic and atraumatic.     Right Ear: Tympanic membrane, ear canal and external ear normal. Tympanic membrane is not erythematous, retracted  or bulging.     Left Ear: Tympanic membrane, ear canal and external ear normal. Tympanic membrane is not erythematous, retracted or bulging.     Nose: Nose normal.     Right Sinus: No maxillary sinus tenderness or frontal sinus tenderness.     Left Sinus: No maxillary sinus tenderness or frontal sinus tenderness.     Mouth/Throat:     Pharynx: Uvula midline. No posterior oropharyngeal erythema.  Eyes:     General: Lids are normal.     Conjunctiva/sclera: Conjunctivae normal.  Neck:     Trachea: Trachea normal.  Cardiovascular:     Rate and Rhythm: Normal rate and regular rhythm.     Heart sounds: Normal heart sounds, S1 normal and S2 normal.  Pulmonary:     Effort: Pulmonary effort is normal.     Breath sounds: Normal breath sounds. No decreased breath sounds, wheezing, rhonchi or rales.  Lymphadenopathy:     Cervical: No cervical adenopathy.  Skin:    General: Skin is warm and dry.  Neurological:     Mental Status: She is alert.  Psychiatric:        Speech: Speech normal.        Behavior: Behavior normal. Behavior is cooperative.     Assessment and Plan:   Subacute cough No red flags on exam. Suspect post-viral cough. Advised as follows: --Continue regular use of over the counter antihistamines such as Zyrtec (cetirizine), Claritin (loratadine), Allegra (fexofenadine), or Xyzal (levocetirizine) daily as needed. May take twice a day if needed as long as it does not cause drowsiness. --Start Nasal saline spray (i.e., Simply Saline) or nasal saline lavage (i.e., NeilMed) is recommended as needed and prior to medicated nasal sprays. --Start Flonase 1 spray twice a day -- generic is FINE! --If nighttime cough, trial 12-hour delsym over the counter cough syrup (generic is fine!)    Reviewed return precautions including worsening fever, SOB, worsening cough or other concerns. Push fluids and rest. I recommend that patient follow-up if symptoms worsen or persist despite treatment x  7-10 days, sooner if needed.  I,Emily Lagle,acting as a Neurosurgeon for [employer], PA.,have documented all relevant documentation on the behalf of Jarold Motto, PA,as directed by  Jarold Motto, PA while in the presence of Jarold Motto, [place].  I, Lelon Mast  Bufford Buttner, [place], have reviewed all documentation for this visit. The documentation on 06/04/23 for the exam, diagnosis, procedures, and orders are all accurate and complete.  Jarold Motto, PA-C

## 2023-06-08 ENCOUNTER — Encounter: Payer: Self-pay | Admitting: Physician Assistant

## 2023-06-13 DIAGNOSIS — F429 Obsessive-compulsive disorder, unspecified: Secondary | ICD-10-CM | POA: Diagnosis not present

## 2023-06-13 DIAGNOSIS — F4381 Prolonged grief disorder: Secondary | ICD-10-CM | POA: Diagnosis not present

## 2023-06-13 DIAGNOSIS — F411 Generalized anxiety disorder: Secondary | ICD-10-CM | POA: Diagnosis not present

## 2023-06-27 DIAGNOSIS — F411 Generalized anxiety disorder: Secondary | ICD-10-CM | POA: Diagnosis not present

## 2023-06-27 DIAGNOSIS — F429 Obsessive-compulsive disorder, unspecified: Secondary | ICD-10-CM | POA: Diagnosis not present

## 2023-06-27 DIAGNOSIS — F4381 Prolonged grief disorder: Secondary | ICD-10-CM | POA: Diagnosis not present

## 2023-07-05 ENCOUNTER — Ambulatory Visit (HOSPITAL_BASED_OUTPATIENT_CLINIC_OR_DEPARTMENT_OTHER)
Admission: RE | Admit: 2023-07-05 | Discharge: 2023-07-05 | Disposition: A | Discharge status: Home / Self Care | Payer: BC Managed Care – PPO | Source: Ambulatory Visit | Attending: Physician Assistant | Admitting: Physician Assistant

## 2023-07-05 ENCOUNTER — Encounter (HOSPITAL_BASED_OUTPATIENT_CLINIC_OR_DEPARTMENT_OTHER): Payer: Self-pay | Admitting: Radiology

## 2023-07-05 DIAGNOSIS — Z1231 Encounter for screening mammogram for malignant neoplasm of breast: Secondary | ICD-10-CM | POA: Diagnosis not present

## 2023-07-11 DIAGNOSIS — F411 Generalized anxiety disorder: Secondary | ICD-10-CM | POA: Diagnosis not present

## 2023-07-11 DIAGNOSIS — F4381 Prolonged grief disorder: Secondary | ICD-10-CM | POA: Diagnosis not present

## 2023-07-11 DIAGNOSIS — F429 Obsessive-compulsive disorder, unspecified: Secondary | ICD-10-CM | POA: Diagnosis not present

## 2023-07-12 ENCOUNTER — Ambulatory Visit (HOSPITAL_BASED_OUTPATIENT_CLINIC_OR_DEPARTMENT_OTHER): Payer: BC Managed Care – PPO | Admitting: Obstetrics & Gynecology

## 2023-07-12 ENCOUNTER — Encounter (HOSPITAL_BASED_OUTPATIENT_CLINIC_OR_DEPARTMENT_OTHER): Payer: Self-pay | Admitting: Obstetrics & Gynecology

## 2023-07-12 VITALS — BP 116/77 | HR 107 | Ht 63.25 in | Wt 180.0 lb

## 2023-07-12 DIAGNOSIS — N898 Other specified noninflammatory disorders of vagina: Secondary | ICD-10-CM

## 2023-07-12 DIAGNOSIS — Z01419 Encounter for gynecological examination (general) (routine) without abnormal findings: Secondary | ICD-10-CM | POA: Diagnosis not present

## 2023-07-12 DIAGNOSIS — Z8742 Personal history of other diseases of the female genital tract: Secondary | ICD-10-CM | POA: Diagnosis not present

## 2023-07-12 DIAGNOSIS — R238 Other skin changes: Secondary | ICD-10-CM

## 2023-07-12 DIAGNOSIS — F419 Anxiety disorder, unspecified: Secondary | ICD-10-CM | POA: Diagnosis not present

## 2023-07-12 MED ORDER — TRIAMCINOLONE ACETONIDE 0.5 % EX OINT: 1.0000 | TOPICAL_OINTMENT | Freq: Two times a day (BID) | CUTANEOUS | 0 refills | Status: DC

## 2023-07-12 NOTE — Progress Notes (Signed)
52 y.o. [id number] Married White or Caucasian female here for annual exam.  Has not had any bleeding this year.    Daughter is in high school.  Has straight A's and in drama.    She does have a tear at the gluteal cleft and wants me to look at this.  Having hot flashes.  Has used estroven.  Isn't currently using.   Having some vaginal dryness.  Having painful intercourse.  OTC and prescription products.    No LMP recorded. (Menstrual status: Perimenopausal).          Sexually active: Yes.    The current method of family planning is post menopausal status.    Smoker:  no  Health Maintenance: Pap:  05/15/2022 ASC-US, negative HR HPV History of abnormal Pap:  no MMG:  07/05/2023 Negative Colonoscopy:  10/2020, follow up 5 years BMD:  not indicated Screening Labs: has appt in December   reports that she has never smoked. She has never used smokeless tobacco. She reports that she does not currently use alcohol. She reports that she does not use drugs.  Past Medical History:  Diagnosis Date   Abnormal Pap smear of cervix    in 20's   Allergy    Anxiety    Asthma    outgrown   Bladder spasms 10/2017   doing pelvic floor PT   Chicken pox    OAB (overactive bladder)    Thyroid disease    was on low dose synthroid at one time, but removed and levels have been "normal" since and followed thorugh GYN   Vitamin D deficiency 07/13/2019    Past Surgical History:  Procedure Laterality Date   APPENDECTOMY  1990   BREAST BIOPSY  2000   biopsy   CERVICAL CERCLAGE  2009   HYMENECTOMY  2005   WISDOM TOOTH EXTRACTION      Current Outpatient Medications  Medication Sig Dispense Refill   loratadine-pseudoephedrine (CLARITIN-D 24 HOUR) 10-240 MG 24 hr tablet Claritin-D 24 Hour     Magnesium 200 MG TABS Take 1 tablet by mouth daily in the afternoon.     Probiotic Product (ACIDOPHILUS/GOAT MILK) CAPS Take by mouth.     VITAMIN D PO Take 1 capsule by mouth daily in the afternoon.     No  current facility-administered medications for this visit.    Family History  Problem Relation Age of Onset   Hyperlipidemia Mother    Other Mother        fell down a flight of stairs   Diabetes Father    Prostate cancer Father        prostate   Myasthenia gravis Father    Prostate cancer Maternal Grandfather        prostate   Prostate cancer Paternal Grandfather        prostate   Arthritis Maternal Grandmother    Osteoporosis Maternal Grandmother        possibly   Lung cancer Paternal Grandmother     ROS: Constitutional: negative Genitourinary:negative  Exam:   BP 116/77 (BP Location: Left Arm, Patient Position: Sitting, Cuff Size: Large)   Pulse (!) 107   Ht 5' 3.25" (1.607 m)   Wt 180 lb (81.6 kg)   BMI 31.63 kg/m   Height: 5' 3.25" (160.7 cm)  General appearance: alert, cooperative and appears stated age Head: Normocephalic, without obvious abnormality, atraumatic Neck: no adenopathy, supple, symmetrical, trachea midline and thyroid normal to inspection and palpation Lungs: clear to auscultation  bilaterally Breasts: normal appearance, no masses or tenderness Heart: regular rate and rhythm Abdomen: soft, non-tender; bowel sounds normal; no masses,  no organomegaly Extremities: extremities normal, atraumatic, no cyanosis or edema Skin: Skin color, texture, turgor normal. No rashes or lesions Lymph nodes: Cervical, supraclavicular, and axillary nodes normal. No abnormal inguinal nodes palpated Neurologic: Grossly normal   Pelvic: External genitalia:  no lesions, superficial tear at superior edge of gluteal cleft              Urethra:  normal appearing urethra with no masses, tenderness or lesions              Bartholins and Skenes: normal                 Vagina: normal appearing vagina with normal color and no discharge, no lesions              Cervix: no lesions              Pap taken: No. Bimanual Exam:  Uterus:  normal size, contour, position, consistency,  mobility, non-tender              Adnexa: normal adnexa and no mass, fullness, tenderness               Rectovaginal: Confirms               Anus:  normal sphincter tone, no lesions  Chaperone, Ina Homes, CMA, was present for exam.  Assessment/Plan: 1. Well woman exam with routine gynecological exam - Pap smear ASCUS with neg HR HPV 2023.  Guidelines reviewed.  No hx of abnormal pap smears.  Will repeat pap 2026. - Mammogram 07/09/2023 - Colonoscopy 2022, follow 5 years - Bone mineral density - lab work will be done in December with Jarold Motto - vaccines reviewed/updated  2. Vaginal dryness - OTC and prescription options discussed.  She is going to consider and will let me know if she needs vaginal estradiol cream  3. Anxiety - does see therapist  4. History of vaginitis - has not had any issues now for an extended period of time.  5. Skin irritation - triamcinolone ointment (KENALOG) 0.5 %; Apply 1 Application topically 2 (two) times daily. Use for 7-10 days.  Dispense: 30 g; Refill: 0

## 2023-07-12 NOTE — Patient Instructions (Signed)
Non hormonal  Coconut oil twice weekly Replens vaginal moisturizer twice weekly Revaree - vaginal hyaluronic acid suppository twice weekly Vit E vaginal suppositories  Hyaluronic acid products -- Lorrin Mais is an [place] product that is a lubricant   Hormonal  Estradiol cream -- twice weekly (prescription)

## 2023-07-25 DIAGNOSIS — F411 Generalized anxiety disorder: Secondary | ICD-10-CM | POA: Diagnosis not present

## 2023-07-25 DIAGNOSIS — F429 Obsessive-compulsive disorder, unspecified: Secondary | ICD-10-CM | POA: Diagnosis not present

## 2023-07-25 DIAGNOSIS — F4381 Prolonged grief disorder: Secondary | ICD-10-CM | POA: Diagnosis not present

## 2023-08-15 NOTE — Progress Notes (Signed)
Subjective:    Jamie Miller is a 52 y.o. female and is here for a comprehensive physical exam.  HPI  There are no preventive care reminders to display for this patient.  Acute Concerns: None  Chronic Issues: Allergies and Asthma This is stable with Claritin-D.  Anxiety/OCD/Tingling Feet Reports she is having increased anxiety today due to stress of coming to the appointment. Reports tingling in feet is worse with increased anxiety. She drinks one cup of tea per day -- denies excessive caffeine intake Reports she started feeling tingling in both feet after starting B6 supplement. Has some tingling in hands. She has decreased the dose and still has some mild tingling. Sees a psychiatrist for her diagnoses and is considering luvox in future.  Requesting thyroid, Vitamin D check. Denies unusual headaches, swelling in neck/ankles, GI issues.  Health Maintenance: Immunizations -- UTD on flu vaccine. Colonoscopy -- 10/2020, follow up 5 years. Mammogram -- 07/05/23, Negative. PAP -- 05/15/22 ASC-US, negative HR HPV  Bone Density -- N/A Diet -- Believes it could be better. Exercise -- Has not been exercising.  Sleep habits -- Sleeps from 11 - 5 and does not believe this is enough. Mood -- Stable  UTD with dentist? - Yes UTD with eye doctor? - Due for exam. Wears glasses for distance vision.  Weight history: Wt Readings from Last 10 Encounters:  08/22/23 179 lb (81.2 kg)  07/12/23 180 lb (81.6 kg)  06/04/23 178 lb 8 oz (81 kg)  08/21/22 174 lb 8 oz (79.2 kg)  05/15/22 172 lb 12.8 oz (78.4 kg)  10/25/21 168 lb 6.4 oz (76.4 kg)  10/17/21 169 lb 3.2 oz (76.7 kg)  03/04/21 165 lb 12.8 oz (75.2 kg)  02/11/21 167 lb 12.8 oz (76.1 kg)  01/18/21 160 lb (72.6 kg)   Body mass index is 31.21 kg/m. Patient's last menstrual period was 07/27/2022.  Alcohol use:  reports that she does not currently use alcohol.  Tobacco use:  Tobacco Use: Low Risk  (08/22/2023)   Patient  History    Smoking Tobacco Use: Never    Smokeless Tobacco Use: Never    Passive Exposure: Not on file   Eligible for lung cancer screening? No     08/22/2023    8:46 AM  Depression screen PHQ 2/9  Decreased Interest 0  Down, Depressed, Hopeless 0  PHQ - 2 Score 0  Altered sleeping 0  Tired, decreased energy 1  Change in appetite 2  Feeling bad or failure about yourself  0  Trouble concentrating 0  Moving slowly or fidgety/restless 0  Suicidal thoughts 0  PHQ-9 Score 3  Difficult doing work/chores Not difficult at all     Other providers/specialists: Patient Care Team: Jarold Motto, [place] as PCP - General (Physician Assistant)    PMHx, SurgHx, SocialHx, Medications, and Allergies were reviewed in the Visit Navigator and updated as appropriate.   Past Medical History:  Diagnosis Date   Abnormal Pap smear of cervix    in 20's   Allergy    Anxiety    Asthma    outgrown   Bladder spasms 10/2017   doing pelvic floor PT   Chicken pox    OAB (overactive bladder)    Thyroid disease    was on low dose synthroid at one time, but removed and levels have been "normal" since and followed thorugh GYN   Vitamin D deficiency 07/13/2019     Past Surgical History:  Procedure Laterality Date  APPENDECTOMY  1990   BREAST BIOPSY  2000   biopsy   CERVICAL CERCLAGE  2009   HYMENECTOMY  2005   WISDOM TOOTH EXTRACTION       Family History  Problem Relation Age of Onset   Hyperlipidemia Mother    Other Mother        fell down a flight of stairs   Diabetes Father    Prostate cancer Father        prostate   Myasthenia gravis Father    Prostate cancer Maternal Grandfather        prostate   Prostate cancer Paternal Grandfather        prostate   Arthritis Maternal Grandmother    Osteoporosis Maternal Grandmother        possibly   Lung cancer Paternal Grandmother     Social History   Tobacco Use   Smoking status: Never   Smokeless tobacco: Never  Vaping Use    Vaping status: Never Used  Substance Use Topics   Alcohol use: Not Currently   Drug use: Never    Review of Systems:   Review of Systems  Constitutional:  Negative for chills, fever, malaise/fatigue and weight loss.  HENT:  Negative for hearing loss, sinus pain and sore throat.   Respiratory:  Negative for cough, hemoptysis and shortness of breath.   Cardiovascular:  Negative for chest pain, palpitations, leg swelling and PND.  Gastrointestinal:  Negative for abdominal pain, constipation, diarrhea, heartburn, nausea and vomiting.  Genitourinary:  Negative for dysuria, frequency and urgency.  Musculoskeletal:  Negative for back pain, myalgias and neck pain.  Skin:  Negative for itching and rash.  Neurological:  Positive for tingling. Negative for dizziness, seizures and headaches.  Endo/Heme/Allergies:  Negative for polydipsia.  Psychiatric/Behavioral:  Negative for depression. The patient is nervous/anxious.     Objective:   BP 126/80 (BP Location: Left Arm, Patient Position: Sitting, Cuff Size: Large)   Pulse 81   Temp 98.2 F (36.8 C) (Temporal)   Ht 5' 3.5" (1.613 m)   Wt 179 lb (81.2 kg)   LMP 07/27/2022   SpO2 97%   BMI 31.21 kg/m  Body mass index is 31.21 kg/m.   General Appearance:    Alert, cooperative, no distress, appears stated age  Head:    Normocephalic, without obvious abnormality, atraumatic  Eyes:    PERRL, conjunctiva/corneas clear, EOM's intact, fundi    benign, both eyes  Ears:    Normal TM's and external ear canals, both ears  Nose:   Nares normal, septum midline, mucosa normal, no drainage    or sinus tenderness  Throat:   Lips, mucosa, and tongue normal; teeth and gums normal  Neck:   Supple, symmetrical, trachea midline, no adenopathy;    thyroid:  no enlargement/tenderness/nodules; no carotid   bruit or JVD  Back:     Symmetric, no curvature, ROM normal, no CVA tenderness  Lungs:     Clear to auscultation bilaterally, respirations unlabored   Chest Wall:    No tenderness or deformity   Heart:    Regular rate and rhythm, S1 and S2 normal, no murmur, rub or gallop  Breast Exam:    Deferred   Abdomen:     Soft, non-tender, bowel sounds active all four quadrants,    no masses, no organomegaly  Genitalia:    Deferred   Extremities:   Extremities normal, atraumatic, no cyanosis or edema  Pulses:   2+ and symmetric all extremities  Skin:   Skin color, texture, turgor normal, no rashes or lesions  Lymph nodes:   Cervical, supraclavicular, and axillary nodes normal  Neurologic:   CNII-XII intact, normal strength, sensation and reflexes    throughout    Assessment/Plan:   Routine physical examination Today patient counseled on age appropriate routine health concerns for screening and prevention, each reviewed and up to date or declined. Immunizations reviewed and up to date or declined. Labs ordered and reviewed. Risk factors for depression reviewed and negative. Hearing function and visual acuity are intact. ADLs screened and addressed as needed. Functional ability and level of safety reviewed and appropriate. Education, counseling and referrals performed based on assessed risks today. Patient provided with a copy of personalized plan for preventive services.  Vitamin D deficiency Update vitamin D and provide recommendations  Obesity, unspecified class, unspecified obesity type, unspecified whether serious comorbidity present Continue efforts on healthy lifestyle  Anxiety; Obsessive-compulsive disorder, unspecified type Uncontrolled but patient reports it is improving Management per psychiatry  Paresthesias Denies any need for work-up or intervention; she strongly feels this is associated with her supplements Continue to monitor and follow-up if any new/worsening symptom(s)   I,Alexander Ruley,acting as a scribe for [employer], PA.,have documented all relevant documentation on the behalf of Jarold Motto, PA,as directed  by  Jarold Motto, PA while in the presence of Jarold Motto, [place].  I, Jarold Motto, [place], have reviewed all documentation for this visit. The documentation on 08/22/23 for the exam, diagnosis, procedures, and orders are all accurate and complete.  Jarold Motto, PA-C [facility]

## 2023-08-18 ENCOUNTER — Encounter (HOSPITAL_BASED_OUTPATIENT_CLINIC_OR_DEPARTMENT_OTHER): Payer: Self-pay | Admitting: Obstetrics & Gynecology

## 2023-08-22 ENCOUNTER — Encounter: Payer: Self-pay | Admitting: Physician Assistant

## 2023-08-22 ENCOUNTER — Ambulatory Visit (INDEPENDENT_AMBULATORY_CARE_PROVIDER_SITE_OTHER): Payer: BC Managed Care – PPO | Admitting: Physician Assistant

## 2023-08-22 VITALS — BP 126/80 | HR 81 | Temp 98.2°F | Ht 63.5 in | Wt 179.0 lb

## 2023-08-22 DIAGNOSIS — E669 Obesity, unspecified: Secondary | ICD-10-CM | POA: Diagnosis not present

## 2023-08-22 DIAGNOSIS — F429 Obsessive-compulsive disorder, unspecified: Secondary | ICD-10-CM

## 2023-08-22 DIAGNOSIS — F419 Anxiety disorder, unspecified: Secondary | ICD-10-CM | POA: Diagnosis not present

## 2023-08-22 DIAGNOSIS — Z0000 Encounter for general adult medical examination without abnormal findings: Secondary | ICD-10-CM

## 2023-08-22 DIAGNOSIS — E559 Vitamin D deficiency, unspecified: Secondary | ICD-10-CM

## 2023-08-22 DIAGNOSIS — R202 Paresthesia of skin: Secondary | ICD-10-CM

## 2023-08-22 LAB — LIPID PANEL
Cholesterol: 198 mg/dL (ref 0–200)
HDL: 60.3 mg/dL (ref >39.00)
LDL Cholesterol: 113 mg/dL — ABNORMAL HIGH (ref 0–99)
NonHDL: 137.44
Total CHOL/HDL Ratio: 3
Triglycerides: 124 mg/dL (ref 0.0–149.0)
VLDL: 24.8 mg/dL (ref 0.0–40.0)

## 2023-08-22 LAB — COMPREHENSIVE METABOLIC PANEL
ALT: 21 U/L (ref 0–35)
AST: 22 U/L (ref 0–37)
Albumin: 4.1 g/dL (ref 3.5–5.2)
Alkaline Phosphatase: 79 U/L (ref 39–117)
BUN: 17 mg/dL (ref 6–23)
CO2: 28 meq/L (ref 19–32)
Calcium: 9.4 mg/dL (ref 8.4–10.5)
Chloride: 106 meq/L (ref 96–112)
Creatinine, Ser: 0.73 mg/dL (ref 0.40–1.20)
GFR: 94.67 mL/min (ref >60.00)
Glucose, Bld: 92 mg/dL (ref 70–99)
Potassium: 4 meq/L (ref 3.5–5.1)
Sodium: 140 meq/L (ref 135–145)
Total Bilirubin: 0.5 mg/dL (ref 0.2–1.2)
Total Protein: 7.2 g/dL (ref 6.0–8.3)

## 2023-08-22 LAB — CBC WITH DIFFERENTIAL/PLATELET
Basophils Absolute: 0 10*3/uL (ref 0.0–0.1)
Basophils Relative: 0.3 % (ref 0.0–3.0)
Eosinophils Absolute: 0.1 10*3/uL (ref 0.0–0.7)
Eosinophils Relative: 2.2 % (ref 0.0–5.0)
HCT: 42 % (ref 36.0–46.0)
Hemoglobin: 14.2 g/dL (ref 12.0–15.0)
Lymphocytes Relative: 22.5 % (ref 12.0–46.0)
Lymphs Abs: 1.4 10*3/uL (ref 0.7–4.0)
MCHC: 33.9 g/dL (ref 30.0–36.0)
MCV: 92.9 fL (ref 78.0–100.0)
Monocytes Absolute: 0.5 10*3/uL (ref 0.1–1.0)
Monocytes Relative: 8.6 % (ref 3.0–12.0)
Neutro Abs: 4 10*3/uL (ref 1.4–7.7)
Neutrophils Relative %: 66.4 % (ref 43.0–77.0)
Platelets: 245 10*3/uL (ref 150.0–400.0)
RBC: 4.52 Mil/uL (ref 3.87–5.11)
RDW: 13.3 % (ref 11.5–15.5)
WBC: 6 10*3/uL (ref 4.0–10.5)

## 2023-08-22 LAB — TSH: TSH: 4.02 u[IU]/mL (ref 0.35–5.50)

## 2023-08-22 LAB — VITAMIN D 25 HYDROXY (VIT D DEFICIENCY, FRACTURES): VITD: 45.61 ng/mL (ref 30.00–100.00)

## 2023-08-22 NOTE — Patient Instructions (Addendum)
It was great to see you!  Try to get to the eye doctor. We will try to get your colonoscopy records. Keep me posted on your leg movements/symptom(s).   Please go to the lab for blood work.   Our office will call you with your results unless you have chosen to receive results via MyChart.  If your blood work is normal we will follow-up each year for physicals and as scheduled for chronic medical problems.  If anything is abnormal we will treat accordingly and get you in for a follow-up.  Take care,  Lelon Mast

## 2023-08-24 ENCOUNTER — Encounter: Payer: Self-pay | Admitting: Physician Assistant

## 2023-08-27 ENCOUNTER — Encounter: Payer: Self-pay | Admitting: Physician Assistant

## 2023-08-27 NOTE — Telephone Encounter (Signed)
Please see message and advise if labs can be done.

## 2023-08-28 DIAGNOSIS — M25512 Pain in left shoulder: Secondary | ICD-10-CM | POA: Diagnosis not present

## 2023-08-28 DIAGNOSIS — S4992XA Unspecified injury of left shoulder and upper arm, initial encounter: Secondary | ICD-10-CM | POA: Diagnosis not present

## 2023-08-28 NOTE — Telephone Encounter (Signed)
Copied from CRM [id number]. Topic: Clinical - Medical Advice >> Aug 28, 2023  9:24 AM Jamie Miller wrote: Reason for CRM: Pt hurt her arm and wants to know if she should go to the ER or Urgent care. She tried to make an appt today but there was none available. She is requesting a call from the nurse.

## 2023-08-28 NOTE — Telephone Encounter (Signed)
Spoke to pt told her if she can not wait then she can go to [facility] at [place] they have a same day clinic, phone number given to pt. Pt verbalized understanding.

## 2023-08-30 ENCOUNTER — Ambulatory Visit: Payer: BC Managed Care – PPO | Admitting: Physician Assistant

## 2023-09-10 DIAGNOSIS — F411 Generalized anxiety disorder: Secondary | ICD-10-CM | POA: Diagnosis not present

## 2023-09-10 DIAGNOSIS — F4381 Prolonged grief disorder: Secondary | ICD-10-CM | POA: Diagnosis not present

## 2023-09-10 DIAGNOSIS — F429 Obsessive-compulsive disorder, unspecified: Secondary | ICD-10-CM | POA: Diagnosis not present

## 2023-09-17 ENCOUNTER — Encounter: Payer: Self-pay | Admitting: Orthopedic Surgery

## 2023-09-19 ENCOUNTER — Ambulatory Visit (HOSPITAL_BASED_OUTPATIENT_CLINIC_OR_DEPARTMENT_OTHER): Payer: BC Managed Care – PPO | Admitting: Student

## 2023-09-19 DIAGNOSIS — M7582 Other shoulder lesions, left shoulder: Secondary | ICD-10-CM

## 2023-09-19 DIAGNOSIS — M25512 Pain in left shoulder: Secondary | ICD-10-CM | POA: Diagnosis not present

## 2023-09-19 MED ORDER — TRIAMCINOLONE ACETONIDE 40 MG/ML IJ SUSP
2.0000 mL | INTRAMUSCULAR | Status: AC | PRN
  Administered 2023-09-19: 2 mL via INTRA_ARTICULAR

## 2023-09-19 MED ORDER — LIDOCAINE HCL 1 % IJ SOLN
4.0000 mL | INTRAMUSCULAR | Status: AC | PRN
  Administered 2023-09-19: 4 mL

## 2023-09-19 NOTE — Progress Notes (Signed)
 Chief Complaint: Left shoulder pain     History of Present Illness:    Jamie Miller is a 53 y.o. left hand dominant female presenting today for evaluation of left shoulder pain.  This began on December 22nd after she had been moving furniture.  Denies a specific injury but states that when she went to reach for something on the top shelf at the store she had immediate pain in the shoulder.  She was seen on 12/24 at urgent care for x-rays and was given a sling as well as naproxen and prednisone.  Pain is mostly located over the lateral deltoid and rates this as moderate to severe.  Pain is worse at night and with range of motion.   Surgical History:   None  PMH/PSH/Family History/Social History/Meds/Allergies:    Past Medical History:  Diagnosis Date   Abnormal Pap smear of cervix    in 20's   Allergy    Anxiety    Asthma    outgrown   Bladder spasms 10/2017   doing pelvic floor PT   Chicken pox    OAB (overactive bladder)    Thyroid  disease    was on low dose synthroid at one time, but removed and levels have been "normal" since and followed thorugh GYN   Vitamin D  deficiency 07/13/2019   Past Surgical History:  Procedure Laterality Date   APPENDECTOMY  1990   BREAST BIOPSY  2000   biopsy   CERVICAL CERCLAGE  2009   HYMENECTOMY  2005   WISDOM TOOTH EXTRACTION     Social History   Socioeconomic History   Marital status: Married    Spouse name: Siegfried Dress   Number of children: 1   Years of education: Not on file   Highest education level: Not on file  Occupational History   Not on file  Tobacco Use   Smoking status: Never   Smokeless tobacco: Never  Vaping Use   Vaping status: Never Used  Substance and Sexual Activity   Alcohol use: Not Currently   Drug use: Never   Sexual activity: Yes    Partners: Male    Birth control/protection: Other-see comments    Comment: Husband had vasectomy  Other Topics Concern   Not on file   Social History Narrative   Married to [place]. Has a daughter Trevor Fudge.    College degree in writing. Did newspaper writing and magazine articles.   Drinks caffeine. Uses herbal remedies. Takes a daily vitamin.    Wears her seatbelt   Smoke detector in the home.    Feels safe in her relationships.    Social Drivers of Corporate investment banker Strain: Low Risk  (06/04/2023)   Overall Financial Resource Strain (CARDIA)    Difficulty of Paying Living Expenses: Not hard at all  Food Insecurity: No Food Insecurity (06/04/2023)   Hunger Vital Sign    Worried About Running Out of Food in the Last Year: Never true    Ran Out of Food in the Last Year: Never true  Transportation Needs: No Transportation Needs (06/04/2023)   PRAPARE - Administrator, Civil Service (Medical): No    Lack of Transportation (Non-Medical): No  Physical Activity: Unknown (06/04/2023)   Exercise Vital Sign    Days of Exercise per Week: Patient  declined    Minutes of Exercise per Session: Not on file  Stress: No Stress Concern Present (06/04/2023)   [employer] of Occupational Health - Occupational Stress Questionnaire    Feeling of Stress : Not at all  Social Connections: Unknown (06/04/2023)   Social Connection and Isolation Panel [NHANES]    Frequency of Communication with Friends and Family: Patient declined    Frequency of Social Gatherings with Friends and Family: Patient declined    Attends Religious Services: Patient declined    Database administrator or Organizations: Yes    Attends Engineer, structural: Patient declined    Marital Status: Married   Family History  Problem Relation Age of Onset   Hyperlipidemia Mother    Other Mother        fell down a flight of stairs   Diabetes Father    Prostate cancer Father        prostate   Myasthenia gravis Father    Prostate cancer Maternal Grandfather        prostate   Prostate cancer Paternal Grandfather        prostate    Arthritis Maternal Grandmother    Osteoporosis Maternal Grandmother        possibly   Lung cancer Paternal Grandmother    Allergies  Allergen Reactions   Dairy Aid  [Tilactase] Rash   Egg-Derived Products Rash   Hemp Seed Oil  [Dronabinol] Rash    itching   Levothyroxine     Other reaction(s): Other Tingling in legs; heart palpitation   Metrogel [Metronidazole]     Other reaction(s): Other Cramps: Metrogel  Can tolerate Tinidazole     Nitrofurantoin Monohyd Macro Diarrhea    Other reaction(s): Other Cramps   Strawberry (Diagnostic) Rash    itching   Wheat Rash   Almond Oil    Keflex [Cephalexin]    Nickel    Other     Sensitive to all abx    Clindamycin      Other reaction(s): Abdominal Pain   Clindamycin  Hcl Other (See Comments)   Cranberry     Other reaction(s): Abdominal Pain   Current Outpatient Medications  Medication Sig Dispense Refill   loratadine-pseudoephedrine (CLARITIN-D 24 HOUR) 10-240 MG 24 hr tablet Claritin-D 24 Hour     Magnesium 200 MG TABS Take 1 tablet by mouth daily in the afternoon.     Multiple Vitamin (MULTIVITAMIN) tablet Take 1 tablet by mouth daily.     Probiotic Product (ACIDOPHILUS/GOAT MILK) CAPS Take by mouth.     triamcinolone  ointment (KENALOG ) 0.5 % Apply 1 Application topically 2 (two) times daily. Use for 7-10 days. 30 g 0   VITAMIN D  PO Take 1 capsule by mouth daily in the afternoon.     No current facility-administered medications for this visit.   No results found.  Review of Systems:   A ROS was performed including pertinent positives and negatives as documented in the HPI.  Physical Exam :   Constitutional: NAD and appears stated age Neurological: Alert and oriented Psych: Appropriate affect and cooperative Last menstrual period 07/27/2022.   Comprehensive Musculoskeletal Exam:    Left shoulder exam demonstrates tenderness over the lateral deltoid.  Active range of motion to 90 degrees forward flexion, 30 degrees  external rotation, and internal rotation to L4 compared to 160/40/L4 of contralateral side.  Pain with Neer, Hawkins, and empty can test.  Imaging:   Xray (left shoulder 3 views): No evidence of acute abnormality.  Calcific tendonitis noted lateral to the humeral head.   I personally reviewed and interpreted the radiographs.   Assessment:   53 y.o. female with acute left shoulder pain.  This is consistent with rotator cuff tendinitis and she does have evidence of calcific tendinitis toward the greater tuberosity.  She has tried naproxen without much relief so I have offered a subacromial cortisone injection for pain relief.  After consideration, patient is agreeable and this was performed today without complication.  She does have some resistance bands at home so I offered resources for at home rotator cuff exercises.  I would like her to have a few sessions of formal PT to help address strengthening of the rotator cuff.  Will plan to follow up in about 5 weeks to assess progress.  Plan :    - Left shoulder subacromial cortisone injection performed today - HEP discussed and referral to physical therapy for rotator cuff strengthening     Procedure Note  Patient: Jamie Miller             Date of Birth: [birth year]           MRN: [id number]             Visit Date: 09/19/2023  Procedures: Visit Diagnoses:  1. Rotator cuff tendinitis, left     Large Joint Inj: L subacromial bursa on 09/19/2023 4:29 PM Indications: pain Details: 22 G 1.5 in needle, posterior approach Medications: 4 mL lidocaine  1 %; 2 mL triamcinolone  acetonide 40 MG/ML Outcome: tolerated well, no immediate complications Procedure, treatment alternatives, risks and benefits explained, specific risks discussed. Consent was given by the patient. Immediately prior to procedure a time out was called to verify the correct patient, procedure, equipment, support staff and site/side marked as required. Patient was prepped and  draped in the usual sterile fashion.      I personally saw and evaluated the patient, and participated in the management and treatment plan.  Sharrell Deck, PA-C Orthopedics

## 2023-09-21 ENCOUNTER — Encounter (HOSPITAL_BASED_OUTPATIENT_CLINIC_OR_DEPARTMENT_OTHER): Payer: Self-pay

## 2023-10-01 ENCOUNTER — Ambulatory Visit: Payer: BC Managed Care – PPO | Admitting: Orthopedic Surgery

## 2023-10-04 DIAGNOSIS — D229 Melanocytic nevi, unspecified: Secondary | ICD-10-CM | POA: Diagnosis not present

## 2023-10-04 DIAGNOSIS — K602 Anal fissure, unspecified: Secondary | ICD-10-CM | POA: Diagnosis not present

## 2023-10-04 DIAGNOSIS — L821 Other seborrheic keratosis: Secondary | ICD-10-CM | POA: Diagnosis not present

## 2023-10-04 DIAGNOSIS — L578 Other skin changes due to chronic exposure to nonionizing radiation: Secondary | ICD-10-CM | POA: Diagnosis not present

## 2023-10-10 DIAGNOSIS — F411 Generalized anxiety disorder: Secondary | ICD-10-CM | POA: Diagnosis not present

## 2023-10-10 DIAGNOSIS — F429 Obsessive-compulsive disorder, unspecified: Secondary | ICD-10-CM | POA: Diagnosis not present

## 2023-10-10 DIAGNOSIS — F4381 Prolonged grief disorder: Secondary | ICD-10-CM | POA: Diagnosis not present

## 2023-10-21 ENCOUNTER — Encounter (HOSPITAL_BASED_OUTPATIENT_CLINIC_OR_DEPARTMENT_OTHER): Payer: Self-pay | Admitting: Obstetrics & Gynecology

## 2023-10-22 ENCOUNTER — Ambulatory Visit (HOSPITAL_BASED_OUTPATIENT_CLINIC_OR_DEPARTMENT_OTHER): Payer: BC Managed Care – PPO | Attending: Student | Admitting: Physical Therapy

## 2023-10-22 ENCOUNTER — Other Ambulatory Visit: Payer: Self-pay

## 2023-10-22 DIAGNOSIS — M25612 Stiffness of left shoulder, not elsewhere classified: Secondary | ICD-10-CM | POA: Diagnosis not present

## 2023-10-22 DIAGNOSIS — M25512 Pain in left shoulder: Secondary | ICD-10-CM | POA: Insufficient documentation

## 2023-10-22 DIAGNOSIS — M7582 Other shoulder lesions, left shoulder: Secondary | ICD-10-CM | POA: Insufficient documentation

## 2023-10-22 DIAGNOSIS — R293 Abnormal posture: Secondary | ICD-10-CM | POA: Insufficient documentation

## 2023-10-22 NOTE — Therapy (Signed)
 OUTPATIENT PHYSICAL THERAPY UPPER EXTREMITY EVALUATION   Patient Name: Jamie Miller MRN: [id number] DOB:Oct 10, 1970, 53 y.o., female Today's Date: 10/22/2023  END OF SESSION:  PT End of Session - 10/22/23 1258     Visit Number 1    Number of Visits 16    Date for PT Re-Evaluation 12/17/23    PT Start Time 0845    PT Stop Time 0928    PT Time Calculation (min) 43 min    Activity Tolerance Patient tolerated treatment well    Behavior During Therapy [facility] for tasks assessed/performed             Past Medical History:  Diagnosis Date   Abnormal Pap smear of cervix    in 20's   Allergy    Anxiety    Asthma    outgrown   Bladder spasms 10/2017   doing pelvic floor PT   Chicken pox    OAB (overactive bladder)    Thyroid disease    was on low dose synthroid at one time, but removed and levels have been "normal" since and followed thorugh GYN   Vitamin D deficiency 07/13/2019   Past Surgical History:  Procedure Laterality Date   APPENDECTOMY  1990   BREAST BIOPSY  2000   biopsy   CERVICAL CERCLAGE  2009   HYMENECTOMY  2005   WISDOM TOOTH EXTRACTION     Patient Active Problem List   Diagnosis Date Noted   Anxiety 08/22/2023   OCD (obsessive compulsive disorder) 08/22/2023   Obesity 08/22/2023   History of vaginitis 10/26/2021   Allergy to environmental factors 09/25/2019   Spondylosis of lumbosacral region without myelopathy or radiculopathy 09/07/2017   Generalized abdominal pain 10/11/2016   Dyspareunia in female 08/25/2015   Vitamin D deficiency 04/16/2013   H/O: hypothyroidism 11/14/2012    PCP: Jarold Motto PA   REFERRING PROVIDER: Hazle Nordmann PA   REFERRING DIAG: Left Shoulder Pain    THERAPY DIAG:  Acute pain of left shoulder  Stiffness of left shoulder, not elsewhere classified  Abnormal posture  Rationale for Evaluation and Treatment: Rehabilitation  ONSET DATE: December 2024  SUBJECTIVE:                                                                                                                                                                                       SUBJECTIVE STATEMENT: Patient reports in December she was lifting some furniture.  Shortly after she began to develop increased pain in her left shoulder.  She is left-hand dominant.  She reports over time the pain became progressively worse.  She has increased pain with certain movements.  At  her last MD visit she had an injection in her shoulder.  She feels like this helped.  Also the MD gave her exercises to perform at home.  She feels like these also have helped.  She continues to have pain with endrange movements.   Hand dominance: Left  PERTINENT HISTORY: History of low back pain, anxiety  PAIN:  Are you having pain? Yes: NPRS scale: 1-2/10 pain highest 4-5  Pain location: top of the shoulder and into the lateral upper arm  Pain description: aching  Aggravating factors: certain movements  Relieving factors: dont use it ads much   PRECAUTIONS: None  RED FLAGS: None   WEIGHT BEARING RESTRICTIONS: No  FALLS:  Has patient fallen in last 6 months? No  LIVING ENVIRONMENT: Nothing pertinent   OCCUPATION: Unemployed   Hobbies:  Reading and writing  Walking   PLOF: Independent  PATIENT GOALS:  To be able to use her arm without discomfort   NEXT MD VISIT: Nothing scheduled     OBJECTIVE:  Note: Objective measures were completed at Evaluation unless otherwise noted.  DIAGNOSTIC FINDINGS:  Xray (left shoulder 3 views): No evidence of acute abnormality.  Calcific tendonitis noted lateral to the humeral head.    PATIENT SURVEYS :  Quick dash 28/55  COGNITION: Overall cognitive status: Within functional limits for tasks assessed     SENSATION: WFL  POSTURE: Rounded shoulders and forward head   UPPER EXTREMITY ROM:   Active ROM Right eval Left eval  Shoulder flexion  94 degrees with pain  Shoulder extension    Shoulder  abduction    Shoulder adduction    Shoulder internal rotation  Can reach to L5 with pain  Shoulder external rotation  Can reach behind her head but have significant pain  Elbow flexion    Elbow extension    Wrist flexion    Wrist extension    Wrist ulnar deviation    Wrist radial deviation    Wrist pronation    Wrist supination    (Blank rows = not tested)   PROM  ROM Right eval Left eval  Shoulder flexion  120 degrees with pain  Shoulder extension    Shoulder abduction    Shoulder adduction    Shoulder internal rotation    Shoulder external rotation  20 degrees with pain but very guarded with this motion.  Able to do more actively when given for HEP  Elbow flexion    Elbow extension  20 degrees with pain but very guarded with this motion  Wrist flexion    Wrist extension    Wrist ulnar deviation    Wrist radial deviation    Wrist pronation    Wrist supination    (Blank rows = not tested) UPPER EXTREMITY MMT:  MMT Right eval Left eval  Shoulder flexion 4+ 3  Shoulder extension    Shoulder abduction    Shoulder adduction    Shoulder internal rotation 4 3+  Shoulder external rotation 4 3  Middle trapezius    Lower trapezius    Elbow flexion    Elbow extension    Wrist flexion    Wrist extension    Wrist ulnar deviation    Wrist radial deviation    Wrist pronation    Wrist supination    Grip strength (lbs)    (Blank rows = not tested)  SHOULDER SPECIAL TESTS: Tested by PA  JOINT MOBILITY TESTING:    PALPATION:  Mild tenderness to palpation in upper.  Spasm in teres/infraspinatus area                                                                                                                             TREATMENT DATE:  Manual: Grade 1 and 2 inferior and posterior glides for pain relief Passive range of motion into ER and flexion with distraction to reduce pain Trigger point release to upper trap  There EX:    Exercises - Circular Shoulder  Pendulum with Table Support  - 1 x daily - 7 x weekly - 3 sets - 10 reps - Supine Shoulder Flexion Extension AAROM with Dowel  - 1 x daily - 7 x weekly - 3 sets - 10 reps - Seated Shoulder External Rotation AAROM with Cane and Hand in Neutral  - 1 x daily - 7 x weekly - 3 sets - 10 reps - Scapular Retraction with Resistance  - 1 x daily - 7 x weekly - 3 sets - 10 reps   PATIENT EDUCATION: Education details: HEP, symptom management, importance of movement within pain-free ranges, Person educated: Patient Education method: Explanation, Demonstration, Tactile cues, Verbal cues, and Handouts Education comprehension: verbalized understanding, returned demonstration, verbal cues required, and tactile cues required  HOME EXERCISE PROGRAM: Access Code: [email or web address] URL: [email or web address] Date: 10/22/2023 Prepared by: Lorayne Bender  ASSESSMENT:  CLINICAL IMPRESSION: Patient is a 53 year old female with acute onset of left shoulder pain in December 2024.  At this time she has limited active and passive range of motion in flexion and external rotation.  Tenderness to palpation in her upper trap exterior rotator cuff muscles.  She has limited functional use of her dominant arm.  She has had some improvement with injections and exercises given to her by her provider.  She is given exercises for home to focus on pain-free movement.  She required some cueing to keep her movements pain-free.  She would benefit from further skilled therapy to improve ability to use dominant arm.  OBJECTIVE IMPAIRMENTS: decreased activity tolerance, decreased ROM, decreased strength, decreased safety awareness, and postural dysfunction.  Impaired use of left upper extremity  ACTIVITY LIMITATIONS: carrying, lifting, dressing, self feeding, reach over head, and hygiene/grooming  PARTICIPATION LIMITATIONS: meal prep, cleaning, laundry, driving, shopping, and community activity  PERSONAL FACTORS: 1 comorbidity:  Anxiety  are also affecting patient's functional outcome.   REHAB POTENTIAL: Good  CLINICAL DECISION MAKING: Evolving/moderate complexity progressive loss over time of dominant arm  EVALUATION COMPLEXITY: Moderate  GOALS: Goals reviewed with patient? Yes  SHORT TERM GOALS: Target date: 11/19/2023-    Patient will increase left active shoulder flexion by 20 degrees Baseline: Goal status: INITIAL  2.  Patient will increase gross left shoulder strength by 1 grade Baseline:  Goal status: INITIAL  3.  Patient will be independent with base exercise program Baseline:  Goal status: INITIAL   LONG TERM GOALS: Target date: 12/17/2023    Patient will reach behind her head with left arm  without pain Baseline:  Goal status: INITIAL  2.  Patient will use left arm for all ADLs without pain Baseline:  Goal status: INITIAL  3.  Patient will demonstrate 4+ out of 5 gross left upper extremity strength in order to perform daily tasks without increased pain Baseline:  Goal status: INITIAL  PLAN: PT FREQUENCY: 1-2x/week  PT DURATION: 8 weeks  PLANNED INTERVENTIONS: 97110-Therapeutic exercises, 97530- Therapeutic activity, [id number]- Neuromuscular re-education, 97535- Self Care, 72536- Manual therapy, [id number]- Aquatic Therapy, 97014- Electrical stimulation (unattended), 97035- Ultrasound, Patient/Family education, Taping, Dry Needling, DME instructions, Cryotherapy, and Moist heat   PLAN FOR NEXT SESSION: Patient was guarded with passive range of motion.  She may do better with active assisted or active range of motion.  Consider pulleys next visit.  Consider advancing scapular exercises.  Consider shoulder extension to neutral.  The patient does well with active ER consider progressing to side-lying ER to add gravity for resistance.  Adjust HEP according to tolerance.   Dessie Coma, PT 10/22/2023, 1:00 PM

## 2023-10-23 ENCOUNTER — Ambulatory Visit (HOSPITAL_BASED_OUTPATIENT_CLINIC_OR_DEPARTMENT_OTHER): Payer: BC Managed Care – PPO | Admitting: Obstetrics & Gynecology

## 2023-10-23 ENCOUNTER — Encounter (HOSPITAL_BASED_OUTPATIENT_CLINIC_OR_DEPARTMENT_OTHER): Payer: Self-pay | Admitting: Obstetrics & Gynecology

## 2023-10-23 ENCOUNTER — Other Ambulatory Visit (HOSPITAL_COMMUNITY)
Admission: RE | Admit: 2023-10-23 | Discharge: 2023-10-23 | Disposition: A | Discharge status: Home / Self Care | Payer: BC Managed Care – PPO | Source: Ambulatory Visit | Attending: Obstetrics & Gynecology | Admitting: Obstetrics & Gynecology

## 2023-10-23 VITALS — BP 115/77 | HR 109 | Ht 64.0 in | Wt 181.6 lb

## 2023-10-23 DIAGNOSIS — N898 Other specified noninflammatory disorders of vagina: Secondary | ICD-10-CM

## 2023-10-23 DIAGNOSIS — N760 Acute vaginitis: Secondary | ICD-10-CM | POA: Diagnosis not present

## 2023-10-23 DIAGNOSIS — B9689 Other specified bacterial agents as the cause of diseases classified elsewhere: Secondary | ICD-10-CM | POA: Diagnosis not present

## 2023-10-23 DIAGNOSIS — R3915 Urgency of urination: Secondary | ICD-10-CM

## 2023-10-23 LAB — POCT URINALYSIS DIPSTICK
Bilirubin, UA: NEGATIVE
Blood, UA: NEGATIVE
Glucose, UA: NEGATIVE
Ketones, UA: NEGATIVE
Nitrite, UA: NEGATIVE
Protein, UA: NEGATIVE
Spec Grav, UA: 1.02 (ref 1.010–1.025)
Urobilinogen, UA: 0.2 U/dL
pH, UA: 5.5 (ref 5.0–8.0)

## 2023-10-23 MED ORDER — SULFAMETHOXAZOLE-TRIMETHOPRIM 800-160 MG PO TABS: 1.0000 | ORAL_TABLET | Freq: Two times a day (BID) | ORAL | 0 refills | Status: DC

## 2023-10-23 MED ORDER — TINIDAZOLE 500 MG PO TABS: ORAL_TABLET | ORAL | 0 refills | Status: DC

## 2023-10-23 NOTE — Progress Notes (Addendum)
 GYNECOLOGY  VISIT  CC:   bladder spasms x 2 weeks, urinary incontinence  HPI: 53 y.o. [id number] Married White or Caucasian female here for complaint of worsening bladder spasms over the past two weeks.  She does have some incontinence (dribbling) that has been a little worse the last two weeks.  Denies dysuria.  POCT testing today with +leukocytes.  Denies flank pain and denies fever.  Denies vaginal discharge or vaginal bleeding.  Having hot flashes but that isn't new.  H/o recurrent vaginitis with possible odor that her husband noted.  Would like testing for this today.  Has some issues with skin tearing at gluteal cleft.  Saw dermatologist.  Prescribed betamethasone.  Is having soreness from this.  Would like suggestion if what to do.  Suggested stopping until soreness is resolved and using less frequently.  Should let derm know as well.   Past Medical History:  Diagnosis Date   Abnormal Pap smear of cervix    in 20's   Allergy    Anxiety    Asthma    outgrown   Bladder spasms 10/2017   doing pelvic floor PT   Chicken pox    OAB (overactive bladder)    Thyroid disease    was on low dose synthroid at one time, but removed and levels have been "normal" since and followed thorugh GYN   Vitamin D deficiency 07/13/2019    MEDS:   Current Outpatient Medications on File Prior to Visit  Medication Sig Dispense Refill   betamethasone dipropionate 0.05 % lotion Apply topically 2 (two) times daily.     loratadine-pseudoephedrine (CLARITIN-D 24 HOUR) 10-240 MG 24 hr tablet Claritin-D 24 Hour     Magnesium 200 MG TABS Take 1 tablet by mouth daily in the afternoon.     Multiple Vitamin (MULTIVITAMIN) tablet Take 1 tablet by mouth daily.     Probiotic Product (ACIDOPHILUS/GOAT MILK) CAPS Take by mouth.     VITAMIN D PO Take 1 capsule by mouth daily in the afternoon.     No current facility-administered medications on file prior to visit.    ALLERGIES: Dairy aid  [tilactase], Egg-derived  products, Hemp seed oil  [dronabinol], Levothyroxine, Metrogel [metronidazole], Nitrofurantoin monohyd macro, Strawberry (diagnostic), Wheat, Almond oil, Keflex [cephalexin], Nickel, Other, Clindamycin, Clindamycin hcl, and Cranberry  SH:  married, non smoker  Review of Systems  Constitutional: Negative.   Genitourinary:  Positive for urgency.       Urinary incontinence    PHYSICAL EXAMINATION:    BP 115/77 (BP Location: Right Arm, Patient Position: Sitting, Cuff Size: Large)   Pulse (!) 109   Ht 5\' 4"  (1.626 m)   Wt 181 lb 9.6 oz (82.4 kg)   LMP 07/27/2022   BMI 31.17 kg/m     General appearance: alert, cooperative and appears stated age Abdomen: soft, non-tender; bowel sounds normal; no masses,  no organomegaly Lymph:  no inguinal LAD noted  Pelvic: External genitalia:  no lesions              Urethra:  normal appearing urethra with no masses, tenderness or lesions              Bartholins and Skenes: normal                 Vagina: normal mucosa without prolapse or lesions, atrophic changes noted              Cervix: no lesions  Bimanual Exam:  Uterus:  normal size, contour, position, consistency, mobility, non-tender              Adnexa: no mass, fullness, tenderness  Chaperone not available at time of exam.  Assessment/Plan: 1. Urgency of urination (Primary) - POCT Urinalysis Dipstick - Urine Culture - sulfamethoxazole-trimethoprim (BACTRIM DS) 800-160 MG tablet; Take 1 tablet by mouth 2 (two) times daily.  Dispense: 6 tablet; Refill: 0 - Given upcoming weather, she would like Rx sent to pharmacy today  2. Vaginal odor - Cervicovaginal ancillary only( [facility]) - sulfamethoxazole-trimethoprim (BACTRIM DS) 800-160 MG tablet; Take 1 tablet by mouth 2 (two) times daily.  Dispense: 6 tablet; Refill: 0  3. BV (bacterial vaginosis) - tinidazole (TINDAMAX) 500 MG tablet; Take 2 tablets by mouth once daily for 5 days.  Dispense: 10 tablet; Refill: 0

## 2023-10-24 ENCOUNTER — Encounter (HOSPITAL_BASED_OUTPATIENT_CLINIC_OR_DEPARTMENT_OTHER): Payer: Self-pay | Admitting: Obstetrics & Gynecology

## 2023-10-24 DIAGNOSIS — R3915 Urgency of urination: Secondary | ICD-10-CM | POA: Diagnosis not present

## 2023-10-24 LAB — CERVICOVAGINAL ANCILLARY ONLY
Bacterial Vaginitis (gardnerella): NEGATIVE
Candida Glabrata: NEGATIVE
Candida Vaginitis: NEGATIVE
Comment: NEGATIVE
Comment: NEGATIVE
Comment: NEGATIVE

## 2023-10-27 LAB — URINE CULTURE

## 2023-10-27 NOTE — Therapy (Deleted)
 OUTPATIENT PHYSICAL THERAPY UPPER EXTREMITY TREATMENT   Patient Name: Jamie Miller MRN: [id number] DOB:01-01-1971, 53 y.o., female Today's Date: 10/27/2023  END OF SESSION:    Past Medical History:  Diagnosis Date   Abnormal Pap smear of cervix    in 20's   Allergy    Anxiety    Asthma    outgrown   Bladder spasms 10/2017   doing pelvic floor PT   Chicken pox    OAB (overactive bladder)    Thyroid disease    was on low dose synthroid at one time, but removed and levels have been "normal" since and followed thorugh GYN   Vitamin D deficiency 07/13/2019   Past Surgical History:  Procedure Laterality Date   APPENDECTOMY  1990   BREAST BIOPSY  2000   biopsy   CERVICAL CERCLAGE  2009   HYMENECTOMY  2005   WISDOM TOOTH EXTRACTION     Patient Active Problem List   Diagnosis Date Noted   Anxiety 08/22/2023   OCD (obsessive compulsive disorder) 08/22/2023   Obesity 08/22/2023   History of vaginitis 10/26/2021   Allergy to environmental factors 09/25/2019   Spondylosis of lumbosacral region without myelopathy or radiculopathy 09/07/2017   Generalized abdominal pain 10/11/2016   Dyspareunia in female 08/25/2015   Vitamin D deficiency 04/16/2013   H/O: hypothyroidism 11/14/2012    PCP: Jarold Motto PA   REFERRING PROVIDER: Hazle Nordmann PA   REFERRING DIAG: Left Shoulder Pain    THERAPY DIAG:  No diagnosis found.  Rationale for Evaluation and Treatment: Rehabilitation  ONSET DATE: December 2024  SUBJECTIVE:                                                                                                                                                                                      SUBJECTIVE STATEMENT: Patient reports in December she was lifting some furniture.  Shortly after she began to develop increased pain in her left shoulder.  She is left-hand dominant.  She reports over time the pain became progressively worse.  She has increased pain with  certain movements.  At her last MD visit she had an injection in her shoulder.  She feels like this helped.  Also the MD gave her exercises to perform at home.  She feels like these also have helped.  She continues to have pain with endrange movements.   Hand dominance: Left  PERTINENT HISTORY: History of low back pain, anxiety  PAIN:  Are you having pain? Yes: NPRS scale: 1-2/10 pain highest 4-5  Pain location: top of the shoulder and into the lateral upper arm  Pain description: aching  Aggravating factors: certain  movements  Relieving factors: dont use it ads much   PRECAUTIONS: None  RED FLAGS: None   WEIGHT BEARING RESTRICTIONS: No  FALLS:  Has patient fallen in last 6 months? No  LIVING ENVIRONMENT: Nothing pertinent   OCCUPATION: Unemployed   Hobbies:  Reading and writing  Walking   PLOF: Independent  PATIENT GOALS:  To be able to use her arm without discomfort   NEXT MD VISIT: Nothing scheduled     OBJECTIVE:  Note: Objective measures were completed at Evaluation unless otherwise noted.  DIAGNOSTIC FINDINGS:  Xray (left shoulder 3 views): No evidence of acute abnormality.  Calcific tendonitis noted lateral to the humeral head.    PATIENT SURVEYS :  Quick dash 28/55  COGNITION: Overall cognitive status: Within functional limits for tasks assessed     SENSATION: WFL  POSTURE: Rounded shoulders and forward head   UPPER EXTREMITY ROM:   Active ROM Right eval Left eval  Shoulder flexion  94 degrees with pain  Shoulder extension    Shoulder abduction    Shoulder adduction    Shoulder internal rotation  Can reach to L5 with pain  Shoulder external rotation  Can reach behind her head but have significant pain  Elbow flexion    Elbow extension    Wrist flexion    Wrist extension    Wrist ulnar deviation    Wrist radial deviation    Wrist pronation    Wrist supination    (Blank rows = not tested)   PROM  ROM Right eval Left eval   Shoulder flexion  120 degrees with pain  Shoulder extension    Shoulder abduction    Shoulder adduction    Shoulder internal rotation    Shoulder external rotation  20 degrees with pain but very guarded with this motion.  Able to do more actively when given for HEP  Elbow flexion    Elbow extension  20 degrees with pain but very guarded with this motion  Wrist flexion    Wrist extension    Wrist ulnar deviation    Wrist radial deviation    Wrist pronation    Wrist supination    (Blank rows = not tested) UPPER EXTREMITY MMT:  MMT Right eval Left eval  Shoulder flexion 4+ 3  Shoulder extension    Shoulder abduction    Shoulder adduction    Shoulder internal rotation 4 3+  Shoulder external rotation 4 3  Middle trapezius    Lower trapezius    Elbow flexion    Elbow extension    Wrist flexion    Wrist extension    Wrist ulnar deviation    Wrist radial deviation    Wrist pronation    Wrist supination    Grip strength (lbs)    (Blank rows = not tested)  SHOULDER SPECIAL TESTS: Tested by PA  JOINT MOBILITY TESTING:    PALPATION:  Mild tenderness to palpation in upper.  Spasm in teres/infraspinatus area  TREATMENT DATE:  Manual: Grade 1 and 2 inferior and posterior glides for pain relief Passive range of motion into ER and flexion with distraction to reduce pain Trigger point release to upper trap  There EX:    Exercises - Circular Shoulder Pendulum with Table Support  - 1 x daily - 7 x weekly - 3 sets - 10 reps - Supine Shoulder Flexion Extension AAROM with Dowel  - 1 x daily - 7 x weekly - 3 sets - 10 reps - Seated Shoulder External Rotation AAROM with Cane and Hand in Neutral  - 1 x daily - 7 x weekly - 3 sets - 10 reps - Scapular Retraction with Resistance  - 1 x daily - 7 x weekly - 3 sets - 10 reps   PATIENT EDUCATION: Education  details: HEP, symptom management, importance of movement within pain-free ranges, Person educated: Patient Education method: Explanation, Demonstration, Tactile cues, Verbal cues, and Handouts Education comprehension: verbalized understanding, returned demonstration, verbal cues required, and tactile cues required  HOME EXERCISE PROGRAM: Access Code: [email or web address] URL: [email or web address] Date: 10/22/2023 Prepared by: Lorayne Bender  ASSESSMENT:  CLINICAL IMPRESSION: Patient is a 53 year old female with acute onset of left shoulder pain in December 2024.  At this time she has limited active and passive range of motion in flexion and external rotation.  Tenderness to palpation in her upper trap exterior rotator cuff muscles.  She has limited functional use of her dominant arm.  She has had some improvement with injections and exercises given to her by her provider.  She is given exercises for home to focus on pain-free movement.  She required some cueing to keep her movements pain-free.  She would benefit from further skilled therapy to improve ability to use dominant arm.  OBJECTIVE IMPAIRMENTS: decreased activity tolerance, decreased ROM, decreased strength, decreased safety awareness, and postural dysfunction.  Impaired use of left upper extremity  ACTIVITY LIMITATIONS: carrying, lifting, dressing, self feeding, reach over head, and hygiene/grooming  PARTICIPATION LIMITATIONS: meal prep, cleaning, laundry, driving, shopping, and community activity  PERSONAL FACTORS: 1 comorbidity: Anxiety  are also affecting patient's functional outcome.   REHAB POTENTIAL: Good  CLINICAL DECISION MAKING: Evolving/moderate complexity progressive loss over time of dominant arm  EVALUATION COMPLEXITY: Moderate  GOALS: Goals reviewed with patient? Yes  SHORT TERM GOALS: Target date: 11/19/2023-    Patient will increase left active shoulder flexion by 20 degrees Baseline: Goal status:  INITIAL  2.  Patient will increase gross left shoulder strength by 1 grade Baseline:  Goal status: INITIAL  3.  Patient will be independent with base exercise program Baseline:  Goal status: INITIAL   LONG TERM GOALS: Target date: 12/17/2023    Patient will reach behind her head with left arm without pain Baseline:  Goal status: INITIAL  2.  Patient will use left arm for all ADLs without pain Baseline:  Goal status: INITIAL  3.  Patient will demonstrate 4+ out of 5 gross left upper extremity strength in order to perform daily tasks without increased pain Baseline:  Goal status: INITIAL  PLAN: PT FREQUENCY: 1-2x/week  PT DURATION: 8 weeks  PLANNED INTERVENTIONS: 97110-Therapeutic exercises, 97530- Therapeutic activity, [id number]- Neuromuscular re-education, 97535- Self Care, 54098- Manual therapy, [id number]- Aquatic Therapy, 97014- Electrical stimulation (unattended), 97035- Ultrasound, Patient/Family education, Taping, Dry Needling, DME instructions, Cryotherapy, and Moist heat   PLAN FOR NEXT SESSION: Patient was guarded with passive range of motion.  She may do better with active assisted or active range of motion.  Consider pulleys next visit.  Consider advancing scapular exercises.  Consider shoulder extension to neutral.  The patient does well with active ER consider progressing to side-lying ER to add gravity for resistance.  Adjust HEP according to tolerance.   Champ Mungo, PT 10/27/2023, 9:00 AM

## 2023-10-31 ENCOUNTER — Ambulatory Visit (INDEPENDENT_AMBULATORY_CARE_PROVIDER_SITE_OTHER): Payer: BC Managed Care – PPO | Admitting: Physical Therapy

## 2023-11-02 DIAGNOSIS — F411 Generalized anxiety disorder: Secondary | ICD-10-CM | POA: Diagnosis not present

## 2023-11-02 DIAGNOSIS — F4381 Prolonged grief disorder: Secondary | ICD-10-CM | POA: Diagnosis not present

## 2023-11-02 DIAGNOSIS — F429 Obsessive-compulsive disorder, unspecified: Secondary | ICD-10-CM | POA: Diagnosis not present

## 2023-11-07 ENCOUNTER — Encounter (HOSPITAL_BASED_OUTPATIENT_CLINIC_OR_DEPARTMENT_OTHER): Payer: Self-pay | Admitting: Physical Therapy

## 2023-11-07 ENCOUNTER — Ambulatory Visit (HOSPITAL_BASED_OUTPATIENT_CLINIC_OR_DEPARTMENT_OTHER): Payer: BC Managed Care – PPO | Attending: Student | Admitting: Physical Therapy

## 2023-11-07 DIAGNOSIS — M25612 Stiffness of left shoulder, not elsewhere classified: Secondary | ICD-10-CM | POA: Diagnosis not present

## 2023-11-07 DIAGNOSIS — M25512 Pain in left shoulder: Secondary | ICD-10-CM | POA: Insufficient documentation

## 2023-11-07 DIAGNOSIS — R293 Abnormal posture: Secondary | ICD-10-CM | POA: Insufficient documentation

## 2023-11-07 NOTE — Therapy (Signed)
 OUTPATIENT PHYSICAL THERAPY UPPER EXTREMITY TREATMENT   Patient Name: Jamie Miller MRN: [id number] DOB:Feb 24, 1971, 53 y.o., female Today's Date: 11/07/2023  END OF SESSION:  PT End of Session - 11/07/23 1629     Visit Number 2    Number of Visits 16    Date for PT Re-Evaluation 12/17/23    PT Start Time 1100    PT Stop Time 1142    PT Time Calculation (min) 42 min    Activity Tolerance Patient tolerated treatment well    Behavior During Therapy WFL for tasks assessed/performed              Past Medical History:  Diagnosis Date   Abnormal Pap smear of cervix    in 20's   Allergy    Anxiety    Asthma    outgrown   Bladder spasms 10/2017   doing pelvic floor PT   Chicken pox    OAB (overactive bladder)    Thyroid disease    was on low dose synthroid at one time, but removed and levels have been "normal" since and followed thorugh GYN   Vitamin D deficiency 07/13/2019   Past Surgical History:  Procedure Laterality Date   APPENDECTOMY  1990   BREAST BIOPSY  2000   biopsy   CERVICAL CERCLAGE  2009   HYMENECTOMY  2005   WISDOM TOOTH EXTRACTION     Patient Active Problem List   Diagnosis Date Noted   Anxiety 08/22/2023   OCD (obsessive compulsive disorder) 08/22/2023   Obesity 08/22/2023   History of vaginitis 10/26/2021   Allergy to environmental factors 09/25/2019   Spondylosis of lumbosacral region without myelopathy or radiculopathy 09/07/2017   Generalized abdominal pain 10/11/2016   Dyspareunia in female 08/25/2015   Vitamin D deficiency 04/16/2013   H/O: hypothyroidism 11/14/2012    PCP: Jarold Motto PA   REFERRING PROVIDER: Hazle Nordmann PA   REFERRING DIAG: Left Shoulder Pain    THERAPY DIAG:  Acute pain of left shoulder  Stiffness of left shoulder, not elsewhere classified  Abnormal posture  Rationale for Evaluation and Treatment: Rehabilitation  ONSET DATE: December 2024  SUBJECTIVE:                                                                                                                                                                                       SUBJECTIVE STATEMENT: The patient feels like her motion many be better. She has been working on her exercises. She feels like the pain is about the same.   Patient reports in December she was lifting some furniture.  Shortly after she began to develop increased pain in  her left shoulder.  She is left-hand dominant.  She reports over time the pain became progressively worse.  She has increased pain with certain movements.  At her last MD visit she had an injection in her shoulder.  She feels like this helped.  Also the MD gave her exercises to perform at home.  She feels like these also have helped.  She continues to have pain with endrange movements.   Hand dominance: Left  PERTINENT HISTORY: History of low back pain, anxiety  PAIN:  Are you having pain? Yes: NPRS scale: 2-310  Pain location: top of the shoulder and into the lateral upper arm  Pain description: aching  Aggravating factors: certain movements  Relieving factors: dont use it ads much   PRECAUTIONS: None  RED FLAGS: None   WEIGHT BEARING RESTRICTIONS: No  FALLS:  Has patient fallen in last 6 months? No  LIVING ENVIRONMENT: Nothing pertinent   OCCUPATION: Unemployed   Hobbies:  Reading and writing  Walking   PLOF: Independent  PATIENT GOALS:  To be able to use her arm without discomfort   NEXT MD VISIT: Nothing scheduled     OBJECTIVE:  Note: Objective measures were completed at Evaluation unless otherwise noted.  DIAGNOSTIC FINDINGS:  Xray (left shoulder 3 views): No evidence of acute abnormality.  Calcific tendonitis noted lateral to the humeral head.    PATIENT SURVEYS :  Quick dash 28/55  COGNITION: Overall cognitive status: Within functional limits for tasks assessed     SENSATION: WFL  POSTURE: Rounded shoulders and forward head   UPPER EXTREMITY  ROM:   Active ROM Right eval Left eval  Shoulder flexion  94 degrees with pain  Shoulder extension    Shoulder abduction    Shoulder adduction    Shoulder internal rotation  Can reach to L5 with pain  Shoulder external rotation  Can reach behind her head but have significant pain  Elbow flexion    Elbow extension    Wrist flexion    Wrist extension    Wrist ulnar deviation    Wrist radial deviation    Wrist pronation    Wrist supination    (Blank rows = not tested)   PROM  ROM Right eval Left eval  Shoulder flexion  120 degrees with pain  Shoulder extension    Shoulder abduction    Shoulder adduction    Shoulder internal rotation    Shoulder external rotation  20 degrees with pain but very guarded with this motion.  Able to do more actively when given for HEP  Elbow flexion    Elbow extension  20 degrees with pain but very guarded with this motion  Wrist flexion    Wrist extension    Wrist ulnar deviation    Wrist radial deviation    Wrist pronation    Wrist supination    (Blank rows = not tested) UPPER EXTREMITY MMT:  MMT Right eval Left eval  Shoulder flexion 4+ 3  Shoulder extension    Shoulder abduction    Shoulder adduction    Shoulder internal rotation 4 3+  Shoulder external rotation 4 3  Middle trapezius    Lower trapezius    Elbow flexion    Elbow extension    Wrist flexion    Wrist extension    Wrist ulnar deviation    Wrist radial deviation    Wrist pronation    Wrist supination    Grip strength (lbs)    (Blank rows =  not tested)  SHOULDER SPECIAL TESTS: Tested by PA  JOINT MOBILITY TESTING:    PALPATION:  Mild tenderness to palpation in upper.  Spasm in teres/infraspinatus area                                                                                                                             TREATMENT DATE:   Manual: All PROM performed with distraction to reduce pain and improve movement. PROM into ER and flexion and  abduction. Trigger point release to upper trap and anterior shoulder.   There-ex: Self wand ER stretch x10 3 sec hold mod cuing  There-Act Chest press for reach  3x10 1lb  Wand flexion 3x10 with mod cuing for technique   Self Care  Nuro-Re-ed  Row yellow with posture and breathing 3x10  Shoulder extension 3x10 yellow with breahting and posture    Eval Manual: Grade 1 and 2 inferior and posterior glides for pain relief Passive range of motion into ER and flexion with distraction to reduce pain Trigger point release to upper trap  There EX:    Exercises - Circular Shoulder Pendulum with Table Support  - 1 x daily - 7 x weekly - 3 sets - 10 reps - Supine Shoulder Flexion Extension AAROM with Dowel  - 1 x daily - 7 x weekly - 3 sets - 10 reps - Seated Shoulder External Rotation AAROM with Cane and Hand in Neutral  - 1 x daily - 7 x weekly - 3 sets - 10 reps - Scapular Retraction with Resistance  - 1 x daily - 7 x weekly - 3 sets - 10 reps   PATIENT EDUCATION: Education details: HEP, symptom management, importance of movement within pain-free ranges, Person educated: Patient Education method: Explanation, Demonstration, Tactile cues, Verbal cues, and Handouts Education comprehension: verbalized understanding, returned demonstration, verbal cues required, and tactile cues required  HOME EXERCISE PROGRAM: Access Code: [email or web address] URL: [email or web address] Date: 10/22/2023 Prepared by: Lorayne Bender  ASSESSMENT:  CLINICAL IMPRESSION: The patient did well today. She requires cuing for technique with her exercises. We added in a shoulder extension and a wand press. She reported pain but mild. She had a signiifcant improvement in ER with manual therapy. She was shown how to do a self ER stretch with wand for home. She was advised to try to maintain gains   Eval: Patient is a 53 year old female with acute onset of left shoulder pain in December 2024.  At this time she  has limited active and passive range of motion in flexion and external rotation.  Tenderness to palpation in her upper trap exterior rotator cuff muscles.  She has limited functional use of her dominant arm.  She has had some improvement with injections and exercises given to her by her provider.  She is given exercises for home to focus on pain-free movement.  She required some cueing to keep her movements pain-free.  She would benefit from further skilled therapy  to improve ability to use dominant arm.  OBJECTIVE IMPAIRMENTS: decreased activity tolerance, decreased ROM, decreased strength, decreased safety awareness, and postural dysfunction.  Impaired use of left upper extremity  ACTIVITY LIMITATIONS: carrying, lifting, dressing, self feeding, reach over head, and hygiene/grooming  PARTICIPATION LIMITATIONS: meal prep, cleaning, laundry, driving, shopping, and community activity  PERSONAL FACTORS: 1 comorbidity: Anxiety  are also affecting patient's functional outcome.   REHAB POTENTIAL: Good  CLINICAL DECISION MAKING: Evolving/moderate complexity progressive loss over time of dominant arm  EVALUATION COMPLEXITY: Moderate  GOALS: Goals reviewed with patient? Yes  SHORT TERM GOALS: Target date: 11/19/2023-    Patient will increase left active shoulder flexion by 20 degrees Baseline: Goal status: INITIAL  2.  Patient will increase gross left shoulder strength by 1 grade Baseline:  Goal status: INITIAL  3.  Patient will be independent with base exercise program Baseline:  Goal status: INITIAL   LONG TERM GOALS: Target date: 12/17/2023    Patient will reach behind her head with left arm without pain Baseline:  Goal status: INITIAL  2.  Patient will use left arm for all ADLs without pain Baseline:  Goal status: INITIAL  3.  Patient will demonstrate 4+ out of 5 gross left upper extremity strength in order to perform daily tasks without increased pain Baseline:  Goal  status: INITIAL  PLAN: PT FREQUENCY: 1-2x/week  PT DURATION: 8 weeks  PLANNED INTERVENTIONS: 97110-Therapeutic exercises, 97530- Therapeutic activity, [id number]- Neuromuscular re-education, 97535- Self Care, 11914- Manual therapy, [id number]- Aquatic Therapy, 97014- Electrical stimulation (unattended), 97035- Ultrasound, Patient/Family education, Taping, Dry Needling, DME instructions, Cryotherapy, and Moist heat   PLAN FOR NEXT SESSION: Patient was guarded with passive range of motion.  She may do better with active assisted or active range of motion.  Consider pulleys next visit.  Consider advancing scapular exercises.  Consider shoulder extension to neutral.  The patient does well with active ER consider progressing to side-lying ER to add gravity for resistance.  Adjust HEP according to tolerance.   Dessie Coma, PT 11/07/2023, 4:32 PM

## 2023-11-14 ENCOUNTER — Ambulatory Visit (HOSPITAL_BASED_OUTPATIENT_CLINIC_OR_DEPARTMENT_OTHER): Payer: BC Managed Care – PPO

## 2023-11-14 ENCOUNTER — Encounter (HOSPITAL_BASED_OUTPATIENT_CLINIC_OR_DEPARTMENT_OTHER): Payer: Self-pay

## 2023-11-14 DIAGNOSIS — M25612 Stiffness of left shoulder, not elsewhere classified: Secondary | ICD-10-CM | POA: Diagnosis not present

## 2023-11-14 DIAGNOSIS — R293 Abnormal posture: Secondary | ICD-10-CM | POA: Diagnosis not present

## 2023-11-14 DIAGNOSIS — M25512 Pain in left shoulder: Secondary | ICD-10-CM

## 2023-11-14 NOTE — Therapy (Signed)
 OUTPATIENT PHYSICAL THERAPY UPPER EXTREMITY TREATMENT   Patient Name: Jamie Miller MRN: [id number] DOB:July 22, 1971, 53 y.o., female Today's Date: 11/14/2023  END OF SESSION:  PT End of Session - 11/14/23 0933     Visit Number 3    Number of Visits 16    Date for PT Re-Evaluation 12/17/23    PT Start Time 0933    PT Stop Time 1015    PT Time Calculation (min) 42 min    Activity Tolerance Patient tolerated treatment well    Behavior During Therapy [facility] for tasks assessed/performed               Past Medical History:  Diagnosis Date   Abnormal Pap smear of cervix    in 20's   Allergy    Anxiety    Asthma    outgrown   Bladder spasms 10/2017   doing pelvic floor PT   Chicken pox    OAB (overactive bladder)    Thyroid disease    was on low dose synthroid at one time, but removed and levels have been "normal" since and followed thorugh GYN   Vitamin D deficiency 07/13/2019   Past Surgical History:  Procedure Laterality Date   APPENDECTOMY  1990   BREAST BIOPSY  2000   biopsy   CERVICAL CERCLAGE  2009   HYMENECTOMY  2005   WISDOM TOOTH EXTRACTION     Patient Active Problem List   Diagnosis Date Noted   Anxiety 08/22/2023   OCD (obsessive compulsive disorder) 08/22/2023   Obesity 08/22/2023   History of vaginitis 10/26/2021   Allergy to environmental factors 09/25/2019   Spondylosis of lumbosacral region without myelopathy or radiculopathy 09/07/2017   Generalized abdominal pain 10/11/2016   Dyspareunia in female 08/25/2015   Vitamin D deficiency 04/16/2013   H/O: hypothyroidism 11/14/2012    PCP: Jarold Motto PA   REFERRING PROVIDER: Hazle Nordmann PA   REFERRING DIAG: Left Shoulder Pain    THERAPY DIAG:  Stiffness of left shoulder, not elsewhere classified  Abnormal posture  Acute pain of left shoulder  Rationale for Evaluation and Treatment: Rehabilitation  ONSET DATE: December 2024  SUBJECTIVE:                                                                                                                                                                                       SUBJECTIVE STATEMENT: Pt reports some increased soreness/tightness since last visit but denies increase in pain.     Patient reports in December she was lifting some furniture.  Shortly after she began to develop increased pain in her left shoulder.  She is left-hand dominant.  She reports over time the pain became progressively worse.  She has increased pain with certain movements.  At her last MD visit she had an injection in her shoulder.  She feels like this helped.  Also the MD gave her exercises to perform at home.  She feels like these also have helped.  She continues to have pain with endrange movements.   Hand dominance: Left  PERTINENT HISTORY: History of low back pain, anxiety  PAIN:  Are you having pain? Yes: NPRS scale: 2-310  Pain location: top of the shoulder and into the lateral upper arm  Pain description: aching  Aggravating factors: certain movements  Relieving factors: dont use it ads much   PRECAUTIONS: None  RED FLAGS: None   WEIGHT BEARING RESTRICTIONS: No  FALLS:  Has patient fallen in last 6 months? No  LIVING ENVIRONMENT: Nothing pertinent   OCCUPATION: Unemployed   Hobbies:  Reading and writing  Walking   PLOF: Independent  PATIENT GOALS:  To be able to use her arm without discomfort   NEXT MD VISIT: Nothing scheduled     OBJECTIVE:  Note: Objective measures were completed at Evaluation unless otherwise noted.  DIAGNOSTIC FINDINGS:  Xray (left shoulder 3 views): No evidence of acute abnormality.  Calcific tendonitis noted lateral to the humeral head.    PATIENT SURVEYS :  Quick dash 28/55  COGNITION: Overall cognitive status: Within functional limits for tasks assessed     SENSATION: WFL  POSTURE: Rounded shoulders and forward head   UPPER EXTREMITY ROM:   Active ROM Right eval  Left eval  Shoulder flexion  94 degrees with pain  Shoulder extension    Shoulder abduction    Shoulder adduction    Shoulder internal rotation  Can reach to L5 with pain  Shoulder external rotation  Can reach behind her head but have significant pain  Elbow flexion    Elbow extension    Wrist flexion    Wrist extension    Wrist ulnar deviation    Wrist radial deviation    Wrist pronation    Wrist supination    (Blank rows = not tested)   PROM  ROM Right eval Left eval  Shoulder flexion  120 degrees with pain  Shoulder extension    Shoulder abduction    Shoulder adduction    Shoulder internal rotation    Shoulder external rotation  20 degrees with pain but very guarded with this motion.  Able to do more actively when given for HEP  Elbow flexion    Elbow extension  20 degrees with pain but very guarded with this motion  Wrist flexion    Wrist extension    Wrist ulnar deviation    Wrist radial deviation    Wrist pronation    Wrist supination    (Blank rows = not tested) UPPER EXTREMITY MMT:  MMT Right eval Left eval  Shoulder flexion 4+ 3  Shoulder extension    Shoulder abduction    Shoulder adduction    Shoulder internal rotation 4 3+  Shoulder external rotation 4 3  Middle trapezius    Lower trapezius    Elbow flexion    Elbow extension    Wrist flexion    Wrist extension    Wrist ulnar deviation    Wrist radial deviation    Wrist pronation    Wrist supination    Grip strength (lbs)    (Blank rows = not tested)  SHOULDER SPECIAL TESTS: Tested by  PA  JOINT MOBILITY TESTING:    PALPATION:  Mild tenderness to palpation in upper.  Spasm in teres/infraspinatus area                                                                                                                             TREATMENT DATE:    Manual: STM to L UT, LS Cupping to UT and LS (static and gentle pressure) PROM L shoulder   There-ex: Seated UT stretch Seated LS  stretch Door way stretch  HEP update   Eval Manual: Grade 1 and 2 inferior and posterior glides for pain relief Passive range of motion into ER and flexion with distraction to reduce pain Trigger point release to upper trap  There EX:    Exercises - Circular Shoulder Pendulum with Table Support  - 1 x daily - 7 x weekly - 3 sets - 10 reps - Supine Shoulder Flexion Extension AAROM with Dowel  - 1 x daily - 7 x weekly - 3 sets - 10 reps - Seated Shoulder External Rotation AAROM with Cane and Hand in Neutral  - 1 x daily - 7 x weekly - 3 sets - 10 reps - Scapular Retraction with Resistance  - 1 x daily - 7 x weekly - 3 sets - 10 reps   PATIENT EDUCATION: Education details: HEP, symptom management, importance of movement within pain-free ranges, Person educated: Patient Education method: Explanation, Demonstration, Tactile cues, Verbal cues, and Handouts Education comprehension: verbalized understanding, returned demonstration, verbal cues required, and tactile cues required  HOME EXERCISE PROGRAM: Access Code: [email or web address] URL: [email or web address] Date: 10/22/2023 Prepared by: Lorayne Bender  ASSESSMENT:  CLINICAL IMPRESSION: Pt with significant tightness throughout L UT/LS up into occipital region. Does c/o recent headaches as well. Spent time on STM to this region and trialed static cupping as well. She was sensitive to too much tension with cups, so kept intensity light. Pt felt benefit from seated stretching and performed doorway stretch gently good result. Added these to HEP.   Eval: Patient is a 53 year old female with acute onset of left shoulder pain in December 2024.  At this time she has limited active and passive range of motion in flexion and external rotation.  Tenderness to palpation in her upper trap exterior rotator cuff muscles.  She has limited functional use of her dominant arm.  She has had some improvement with injections and exercises given to her  by her provider.  She is given exercises for home to focus on pain-free movement.  She required some cueing to keep her movements pain-free.  She would benefit from further skilled therapy to improve ability to use dominant arm.  OBJECTIVE IMPAIRMENTS: decreased activity tolerance, decreased ROM, decreased strength, decreased safety awareness, and postural dysfunction.  Impaired use of left upper extremity  ACTIVITY LIMITATIONS: carrying, lifting, dressing, self feeding, reach over head, and hygiene/grooming  PARTICIPATION LIMITATIONS: meal prep, cleaning, laundry, driving, shopping, and community activity  PERSONAL  FACTORS: 1 comorbidity: Anxiety  are also affecting patient's functional outcome.   REHAB POTENTIAL: Good  CLINICAL DECISION MAKING: Evolving/moderate complexity progressive loss over time of dominant arm  EVALUATION COMPLEXITY: Moderate  GOALS: Goals reviewed with patient? Yes  SHORT TERM GOALS: Target date: 11/19/2023-    Patient will increase left active shoulder flexion by 20 degrees Baseline: Goal status: INITIAL  2.  Patient will increase gross left shoulder strength by 1 grade Baseline:  Goal status: INITIAL  3.  Patient will be independent with base exercise program Baseline:  Goal status: INITIAL   LONG TERM GOALS: Target date: 12/17/2023    Patient will reach behind her head with left arm without pain Baseline:  Goal status: INITIAL  2.  Patient will use left arm for all ADLs without pain Baseline:  Goal status: INITIAL  3.  Patient will demonstrate 4+ out of 5 gross left upper extremity strength in order to perform daily tasks without increased pain Baseline:  Goal status: INITIAL  PLAN: PT FREQUENCY: 1-2x/week  PT DURATION: 8 weeks  PLANNED INTERVENTIONS: 97110-Therapeutic exercises, 97530- Therapeutic activity, [id number]- Neuromuscular re-education, 97535- Self Care, [id number]- Manual therapy, [id number]- Aquatic Therapy, 97014- Electrical  stimulation (unattended), 97035- Ultrasound, Patient/Family education, Taping, Dry Needling, DME instructions, Cryotherapy, and Moist heat   PLAN FOR NEXT SESSION: Patient was guarded with passive range of motion.  She may do better with active assisted or active range of motion.  Consider pulleys next visit.  Consider advancing scapular exercises.  Consider shoulder extension to neutral.  The patient does well with active ER consider progressing to side-lying ER to add gravity for resistance.  Adjust HEP according to tolerance.   Donnel Saxon Chasty Randal, PTA 11/14/2023, 12:17 PM

## 2023-11-23 ENCOUNTER — Ambulatory Visit (HOSPITAL_BASED_OUTPATIENT_CLINIC_OR_DEPARTMENT_OTHER): Payer: BC Managed Care – PPO | Admitting: Physical Therapy

## 2023-11-23 DIAGNOSIS — R293 Abnormal posture: Secondary | ICD-10-CM

## 2023-11-23 DIAGNOSIS — M25512 Pain in left shoulder: Secondary | ICD-10-CM | POA: Diagnosis not present

## 2023-11-23 DIAGNOSIS — M25612 Stiffness of left shoulder, not elsewhere classified: Secondary | ICD-10-CM | POA: Diagnosis not present

## 2023-11-23 NOTE — Therapy (Signed)
 OUTPATIENT PHYSICAL THERAPY UPPER EXTREMITY TREATMENT   Patient Name: Jamie Miller MRN: [id number] DOB:[birth year], 53 y.o., female Today's Date: 11/23/2023  END OF SESSION:  PT End of Session - 11/23/23 0919     Visit Number 4    Number of Visits 16    Date for PT Re-Evaluation 12/17/23    PT Start Time 0845    PT Stop Time 0925    PT Time Calculation (min) 40 min    Activity Tolerance Patient tolerated treatment well    Behavior During Therapy [facility] for tasks assessed/performed                Past Medical History:  Diagnosis Date   Abnormal Pap smear of cervix    in 20's   Allergy    Anxiety    Asthma    outgrown   Bladder spasms 10/2017   doing pelvic floor PT   Chicken pox    OAB (overactive bladder)    Thyroid disease    was on low dose synthroid at one time, but removed and levels have been "normal" since and followed thorugh GYN   Vitamin D deficiency 07/13/2019   Past Surgical History:  Procedure Laterality Date   APPENDECTOMY  1990   BREAST BIOPSY  2000   biopsy   CERVICAL CERCLAGE  2009   HYMENECTOMY  2005   WISDOM TOOTH EXTRACTION     Patient Active Problem List   Diagnosis Date Noted   Anxiety 08/22/2023   OCD (obsessive compulsive disorder) 08/22/2023   Obesity 08/22/2023   History of vaginitis 10/26/2021   Allergy to environmental factors 09/25/2019   Spondylosis of lumbosacral region without myelopathy or radiculopathy 09/07/2017   Generalized abdominal pain 10/11/2016   Dyspareunia in female 08/25/2015   Vitamin D deficiency 04/16/2013   H/O: hypothyroidism 11/14/2012    PCP: Jarold Motto PA   REFERRING PROVIDER: Hazle Nordmann PA   REFERRING DIAG: Left Shoulder Pain    THERAPY DIAG:  No diagnosis found.  Rationale for Evaluation and Treatment: Rehabilitation  ONSET DATE: December 2024  SUBJECTIVE:                                                                                                                                                                                       SUBJECTIVE STATEMENT: The patient reports that she has been having difficulty reaching behind her back. She reports that otherwise she has been doing well.   Patient reports in December she was lifting some furniture.  Shortly after she began to develop increased pain in her left shoulder.  She is left-hand dominant.  She reports over time the  pain became progressively worse.  She has increased pain with certain movements.  At her last MD visit she had an injection in her shoulder.  She feels like this helped.  Also the MD gave her exercises to perform at home.  She feels like these also have helped.  She continues to have pain with endrange movements.   Hand dominance: Left  PERTINENT HISTORY: History of low back pain, anxiety  PAIN:  Are you having pain? Yes: NPRS scale: 2-310  Pain location: top of the shoulder and into the lateral upper arm  Pain description: aching  Aggravating factors: certain movements  Relieving factors: dont use it ads much   PRECAUTIONS: None  RED FLAGS: None   WEIGHT BEARING RESTRICTIONS: No  FALLS:  Has patient fallen in last 6 months? No  LIVING ENVIRONMENT: Nothing pertinent   OCCUPATION: Unemployed   Hobbies:  Reading and writing  Walking   PLOF: Independent  PATIENT GOALS:  To be able to use her arm without discomfort   NEXT MD VISIT: Nothing scheduled     OBJECTIVE:  Note: Objective measures were completed at Evaluation unless otherwise noted.  DIAGNOSTIC FINDINGS:  Xray (left shoulder 3 views): No evidence of acute abnormality.  Calcific tendonitis noted lateral to the humeral head.    PATIENT SURVEYS :  Quick dash 28/55  COGNITION: Overall cognitive status: Within functional limits for tasks assessed     SENSATION: WFL  POSTURE: Rounded shoulders and forward head   UPPER EXTREMITY ROM:   Active ROM Right eval Left eval  Shoulder flexion  94 degrees with  pain  Shoulder extension    Shoulder abduction    Shoulder adduction    Shoulder internal rotation  Can reach to L5 with pain  Shoulder external rotation  Can reach behind her head but have significant pain  Elbow flexion    Elbow extension    Wrist flexion    Wrist extension    Wrist ulnar deviation    Wrist radial deviation    Wrist pronation    Wrist supination    (Blank rows = not tested)   PROM  ROM Right eval Left eval  Shoulder flexion  120 degrees with pain  Shoulder extension    Shoulder abduction    Shoulder adduction    Shoulder internal rotation    Shoulder external rotation  20 degrees with pain but very guarded with this motion.  Able to do more actively when given for HEP  Elbow flexion    Elbow extension  20 degrees with pain but very guarded with this motion  Wrist flexion    Wrist extension    Wrist ulnar deviation    Wrist radial deviation    Wrist pronation    Wrist supination    (Blank rows = not tested) UPPER EXTREMITY MMT:  MMT Right eval Left eval  Shoulder flexion 4+ 3  Shoulder extension    Shoulder abduction    Shoulder adduction    Shoulder internal rotation 4 3+  Shoulder external rotation 4 3  Middle trapezius    Lower trapezius    Elbow flexion    Elbow extension    Wrist flexion    Wrist extension    Wrist ulnar deviation    Wrist radial deviation    Wrist pronation    Wrist supination    Grip strength (lbs)    (Blank rows = not tested)  SHOULDER SPECIAL TESTS: Tested by PA  JOINT MOBILITY TESTING:  PALPATION:  Mild tenderness to palpation in upper.  Spasm in teres/infraspinatus area                                                                                                                             TREATMENT DATE:  3/21 Manual: STM to L UT, LS PROM into ER and flexion with distraction. Grade 1 and II inferior and posterior glides  Reviewed self trigger point release with there-cane. Reviewed how to  purchase thera-cane   Reviewed anatomy of the upper trap and referral patterns   There-ex:  Reviewed behind the  back stretching with wand. Patient had difficulty with technique and it caused significant soreness in the shoulder   Neur-re-ed:  Mutli pane ball roll 3x10 reported mild pain  Row yellow band. Cuing to leave shoulders down 3x10     Last visit:  Manual: STM to L UT, LS Cupping to UT and LS (static and gentle pressure) PROM L shoulder   There-ex: Seated UT stretch Seated LS stretch Door way stretch  HEP update      PATIENT EDUCATION: Education details: HEP, symptom management, importance of movement within pain-free ranges, Person educated: Patient Education method: Explanation, Demonstration, Tactile cues, Verbal cues, and Handouts Education comprehension: verbalized understanding, returned demonstration, verbal cues required, and tactile cues required  HOME EXERCISE PROGRAM: Access Code: [email or web address] URL: [email or web address] Date: 10/22/2023 Prepared by: Lorayne Bender  ASSESSMENT:  CLINICAL IMPRESSION: The patient had a significant increase in pain following training for behind the back exercises. She had difficulty with the technique. She was advised not to do it at home yet. She did well with her other exercises although she had some residual pain because of the behind the back work. She required cuing to keep her shoulders down with her rows. She had spasming in her upper trap. We reviewed self soft tissue mobilization with a thera-cane. She required cuing not to push too hard.See below for goal specific progress     Eval: Patient is a 53 year old female with acute onset of left shoulder pain in December 2024.  At this time she has limited active and passive range of motion in flexion and external rotation.  Tenderness to palpation in her upper trap exterior rotator cuff muscles.  She has limited functional use of her dominant arm.  She has  had some improvement with injections and exercises given to her by her provider.  She is given exercises for home to focus on pain-free movement.  She required some cueing to keep her movements pain-free.  She would benefit from further skilled therapy to improve ability to use dominant arm.  OBJECTIVE IMPAIRMENTS: decreased activity tolerance, decreased ROM, decreased strength, decreased safety awareness, and postural dysfunction.  Impaired use of left upper extremity  ACTIVITY LIMITATIONS: carrying, lifting, dressing, self feeding, reach over head, and hygiene/grooming  PARTICIPATION LIMITATIONS: meal prep, cleaning, laundry, driving, shopping, and community activity  PERSONAL FACTORS: 1 comorbidity: Anxiety  are also  affecting patient's functional outcome.   REHAB POTENTIAL: Good  CLINICAL DECISION MAKING: Evolving/moderate complexity progressive loss over time of dominant arm  EVALUATION COMPLEXITY: Moderate  GOALS: Goals reviewed with patient? Yes  SHORT TERM GOALS: Target date: 11/19/2023-    Patient will increase left active shoulder flexion by 20 degrees Baseline: Goal status: improving 3/21   2.  Patient will increase gross left shoulder strength by 1 grade Baseline:  Goal status: progressing strengthening 3/21   3.  Patient will be independent with base exercise program Baseline:  Goal status: INITIAL   LONG TERM GOALS: Target date: 12/17/2023    Patient will reach behind her head with left arm without pain Baseline:  Goal status: INITIAL  2.  Patient will use left arm for all ADLs without pain Baseline:  Goal status: INITIAL  3.  Patient will demonstrate 4+ out of 5 gross left upper extremity strength in order to perform daily tasks without increased pain Baseline:  Goal status: INITIAL  PLAN: PT FREQUENCY: 1-2x/week  PT DURATION: 8 weeks  PLANNED INTERVENTIONS: 97110-Therapeutic exercises, 97530- Therapeutic activity, [id number]- Neuromuscular  re-education, 97535- Self Care, [id number]- Manual therapy, [id number]- Aquatic Therapy, 97014- Electrical stimulation (unattended), 97035- Ultrasound, Patient/Family education, Taping, Dry Needling, DME instructions, Cryotherapy, and Moist heat   PLAN FOR NEXT SESSION: Patient was guarded with passive range of motion.  She may do better with active assisted or active range of motion.  Consider pulleys next visit.  Consider advancing scapular exercises.  Consider shoulder extension to neutral.  The patient does well with active ER consider progressing to side-lying ER to add gravity for resistance.  Adjust HEP according to tolerance.   Dessie Coma, PT 11/23/2023, 10:00 AM

## 2023-11-27 ENCOUNTER — Ambulatory Visit (HOSPITAL_BASED_OUTPATIENT_CLINIC_OR_DEPARTMENT_OTHER)

## 2023-11-27 ENCOUNTER — Encounter (HOSPITAL_BASED_OUTPATIENT_CLINIC_OR_DEPARTMENT_OTHER): Payer: Self-pay

## 2023-12-01 ENCOUNTER — Encounter (HOSPITAL_BASED_OUTPATIENT_CLINIC_OR_DEPARTMENT_OTHER): Payer: Self-pay | Admitting: Obstetrics & Gynecology

## 2023-12-05 ENCOUNTER — Other Ambulatory Visit (HOSPITAL_BASED_OUTPATIENT_CLINIC_OR_DEPARTMENT_OTHER): Payer: Self-pay | Admitting: *Deleted

## 2023-12-05 DIAGNOSIS — N950 Postmenopausal bleeding: Secondary | ICD-10-CM

## 2023-12-06 NOTE — Therapy (Signed)
 OUTPATIENT PHYSICAL THERAPY UPPER EXTREMITY TREATMENT   Patient Name: Jamie Miller MRN: [id number] DOB:[birth year], 53 y.o., female Today's Date: 12/07/2023  END OF SESSION:  PT End of Session - 12/07/23 0933     Visit Number 5    Number of Visits 16    Date for PT Re-Evaluation 12/17/23    PT Start Time 0933    PT Stop Time 1011    PT Time Calculation (min) 38 min                 Past Medical History:  Diagnosis Date   Abnormal Pap smear of cervix    in 20's   Allergy    Anxiety    Asthma    outgrown   Bladder spasms 10/2017   doing pelvic floor PT   Chicken pox    OAB (overactive bladder)    Thyroid disease    was on low dose synthroid at one time, but removed and levels have been "normal" since and followed thorugh GYN   Vitamin D deficiency 07/13/2019   Past Surgical History:  Procedure Laterality Date   APPENDECTOMY  1990   BREAST BIOPSY  2000   biopsy   CERVICAL CERCLAGE  2009   HYMENECTOMY  2005   WISDOM TOOTH EXTRACTION     Patient Active Problem List   Diagnosis Date Noted   Anxiety 08/22/2023   OCD (obsessive compulsive disorder) 08/22/2023   Obesity 08/22/2023   History of vaginitis 10/26/2021   Allergy to environmental factors 09/25/2019   Spondylosis of lumbosacral region without myelopathy or radiculopathy 09/07/2017   Generalized abdominal pain 10/11/2016   Dyspareunia in female 08/25/2015   Vitamin D deficiency 04/16/2013   H/O: hypothyroidism 11/14/2012    PCP: Jarold Motto PA   REFERRING PROVIDER: Hazle Nordmann PA   REFERRING DIAG: Left Shoulder Pain    THERAPY DIAG:  Stiffness of left shoulder, not elsewhere classified  Abnormal posture  Acute pain of left shoulder  Rationale for Evaluation and Treatment: Rehabilitation  ONSET DATE: December 2024  SUBJECTIVE:                                                                                                                                                                                       SUBJECTIVE STATEMENT: 12/07/2023 Pt states she tends to do okay after PT sessions, having about the same amount of pain overall. Reports HEP going well but feels like she tends to push it.    Patient reports in December she was lifting some furniture.  Shortly after she began to develop increased pain in her left shoulder.  She is left-hand dominant.  She reports over time the pain became progressively worse.  She has increased pain with certain movements.  At her last MD visit she had an injection in her shoulder.  She feels like this helped.  Also the MD gave her exercises to perform at home.  She feels like these also have helped.  She continues to have pain with endrange movements.   Hand dominance: Left  PERTINENT HISTORY: History of low back pain, anxiety  PAIN:  Are you having pain? Yes: NPRS scale: 5/10 Pain location: top of the shoulder and into the lateral upper arm  Pain description: aching  Aggravating factors: certain movements  Relieving factors: dont use it ads much   PRECAUTIONS: None  RED FLAGS: None   WEIGHT BEARING RESTRICTIONS: No  FALLS:  Has patient fallen in last 6 months? No  LIVING ENVIRONMENT: Nothing pertinent   OCCUPATION: Unemployed   Hobbies:  Reading and writing  Walking   PLOF: Independent  PATIENT GOALS:  To be able to use her arm without discomfort   NEXT MD VISIT: Nothing scheduled     OBJECTIVE:  Note: Objective measures were completed at Evaluation unless otherwise noted.  DIAGNOSTIC FINDINGS:  Xray (left shoulder 3 views): No evidence of acute abnormality.  Calcific tendonitis noted lateral to the humeral head.    PATIENT SURVEYS :  Quick dash 28/55  COGNITION: Overall cognitive status: Within functional limits for tasks assessed     SENSATION: WFL  POSTURE: Rounded shoulders and forward head   UPPER EXTREMITY ROM:   Active ROM Right eval Left eval  Shoulder flexion  94 degrees with  pain  Shoulder extension    Shoulder abduction    Shoulder adduction    Shoulder internal rotation  Can reach to L5 with pain  Shoulder external rotation  Can reach behind her head but have significant pain  Elbow flexion    Elbow extension    Wrist flexion    Wrist extension    Wrist ulnar deviation    Wrist radial deviation    Wrist pronation    Wrist supination    (Blank rows = not tested)   PROM  ROM Right eval Left eval  Shoulder flexion  120 degrees with pain  Shoulder extension    Shoulder abduction    Shoulder adduction    Shoulder internal rotation    Shoulder external rotation  20 degrees with pain but very guarded with this motion.  Able to do more actively when given for HEP  Elbow flexion    Elbow extension  20 degrees with pain but very guarded with this motion  Wrist flexion    Wrist extension    Wrist ulnar deviation    Wrist radial deviation    Wrist pronation    Wrist supination    (Blank rows = not tested) UPPER EXTREMITY MMT:  MMT Right eval Left eval  Shoulder flexion 4+ 3  Shoulder extension    Shoulder abduction    Shoulder adduction    Shoulder internal rotation 4 3+  Shoulder external rotation 4 3  Middle trapezius    Lower trapezius    Elbow flexion    Elbow extension    Wrist flexion    Wrist extension    Wrist ulnar deviation    Wrist radial deviation    Wrist pronation    Wrist supination    Grip strength (lbs)    (Blank rows = not tested)  SHOULDER SPECIAL TESTS: Tested by PA  JOINT MOBILITY TESTING:    PALPATION:  Mild tenderness to palpation in upper.  Spasm in teres/infraspinatus area                                                                                                                             TREATMENT DATE:  [facility] Adult PT Treatment:                                                DATE: 12/07/23 Therapeutic Exercise: Swiss ball fwd flexion 2x10 cues for breath control and comfortable ROM Red band row  2x8 cues for posture Yellow band bicep curl 2x8 Swiss ball abduction rollout x10  cues for setup and comfortable ROM Significant time spent w/ education/discussion for HEP, strategies to maximize adherence/performance  Manual Therapy: Education/discussion re: theracane use for symptom modification, indications and rationale for use as needed    3/21 Manual: STM to L UT, LS PROM into ER and flexion with distraction. Grade 1 and II inferior and posterior glides  Reviewed self trigger point release with there-cane. Reviewed how to purchase thera-cane   Reviewed anatomy of the upper trap and referral patterns   There-ex:  Reviewed behind the  back stretching with wand. Patient had difficulty with technique and it caused significant soreness in the shoulder   Neur-re-ed:  Mutli pane ball roll 3x10 reported mild pain  Row yellow band. Cuing to leave shoulders down 3x10        PATIENT EDUCATION: Education details: HEP, symptom management, importance of movement within pain-free ranges, Person educated: Patient Education method: Explanation, Demonstration, Tactile cues, Verbal cues, and Handouts Education comprehension: verbalized understanding, returned demonstration, verbal cues required, and tactile cues required  HOME EXERCISE PROGRAM: Access Code: [email or web address] URL: [email or web address] Date: 12/07/2023 Prepared by: Fransisco Hertz  Program Notes - can use yellow or red band for scapular retraction and shoulder extension, based on comfort- can use yellow band for elbow flexion (bicep curls)  Exercises - Seated Shoulder Flexion Towel Slide at Table Top  - 2-3 x daily - 1 sets - 8 reps - Seated Upper Trapezius Stretch  - 2-3 x daily - 3 sets - 30seconds hold - Gentle Levator Scapulae Stretch  - 2-3 x daily - 3 sets - 30 seconds hold - Corner Pec Minor Stretch  - 2-3 x daily - 3-5 sets - 10-15seconds hold - Theracane Over Shoulder  - 1 x daily - 7 x weekly - 3 sets  - 1 reps - min  hold - Scapular Retraction with Resistance  - 3-4 x weekly - 2-3 sets - 10 reps - Shoulder extension with resistance - Neutral  - 3-4 x weekly - 2-3 sets - 10 reps - Standing Single Arm Elbow Flexion with Resistance  - 3-4 x weekly - 2-3 sets - 6 reps  ASSESSMENT:  CLINICAL IMPRESSION: 12/07/2023 Pt arrives w/ report of baseline pain, no issues after last session. Continuing to work on [facility] mobility and increased resistance for periscapular work. Significant time spent w/ HEP education/discussion to maximize performance/adherence. She does well with addition of bicep strengthening and states it feels productive. Pt reports having gotten a theracane but still not confident with its use, we practice today and she endorses limited benefit. No adverse events, tolerates session well overall with good progression of mobility training. Recommend continuing along current POC in order to address relevant deficits and improve functional tolerance. Pt departs today's session in no acute distress, all voiced questions/concerns addressed appropriately from PT perspective.       Eval: Patient is a 53 year old female with acute onset of left shoulder pain in December 2024.  At this time she has limited active and passive range of motion in flexion and external rotation.  Tenderness to palpation in her upper trap exterior rotator cuff muscles.  She has limited functional use of her dominant arm.  She has had some improvement with injections and exercises given to her by her provider.  She is given exercises for home to focus on pain-free movement.  She required some cueing to keep her movements pain-free.  She would benefit from further skilled therapy to improve ability to use dominant arm.  OBJECTIVE IMPAIRMENTS: decreased activity tolerance, decreased ROM, decreased strength, decreased safety awareness, and postural dysfunction.  Impaired use of left upper extremity  ACTIVITY LIMITATIONS: carrying,  lifting, dressing, self feeding, reach over head, and hygiene/grooming  PARTICIPATION LIMITATIONS: meal prep, cleaning, laundry, driving, shopping, and community activity  PERSONAL FACTORS: 1 comorbidity: Anxiety  are also affecting patient's functional outcome.   REHAB POTENTIAL: Good  CLINICAL DECISION MAKING: Evolving/moderate complexity progressive loss over time of dominant arm  EVALUATION COMPLEXITY: Moderate  GOALS: Goals reviewed with patient? Yes  SHORT TERM GOALS: Target date: 11/19/2023-    Patient will increase left active shoulder flexion by 20 degrees Baseline: Goal status: improving 3/21   2.  Patient will increase gross left shoulder strength by 1 grade Baseline:  Goal status: progressing strengthening 3/21   3.  Patient will be independent with base exercise program Baseline:  Goal status: INITIAL   LONG TERM GOALS: Target date: 12/17/2023    Patient will reach behind her head with left arm without pain Baseline:  Goal status: INITIAL  2.  Patient will use left arm for all ADLs without pain Baseline:  Goal status: INITIAL  3.  Patient will demonstrate 4+ out of 5 gross left upper extremity strength in order to perform daily tasks without increased pain Baseline:  Goal status: INITIAL  PLAN: PT FREQUENCY: 1-2x/week  PT DURATION: 8 weeks  PLANNED INTERVENTIONS: 97110-Therapeutic exercises, 97530- Therapeutic activity, [id number]- Neuromuscular re-education, 97535- Self Care, [id number]- Manual therapy, [id number]- Aquatic Therapy, 97014- Electrical stimulation (unattended), 97035- Ultrasound, Patient/Family education, Taping, Dry Needling, DME instructions, Cryotherapy, and Moist heat   PLAN FOR NEXT SESSION: Patient was guarded with passive range of motion.  She may do better with active assisted or active range of motion.  Consider pulleys next visit.  Consider advancing scapular exercises.  Consider shoulder extension to neutral.  The patient does well with  active ER consider progressing to side-lying ER to add gravity for resistance.  Adjust HEP according to tolerance.   Ashley Murrain PT, DPT 12/07/2023 12:53 PM

## 2023-12-07 ENCOUNTER — Ambulatory Visit (HOSPITAL_BASED_OUTPATIENT_CLINIC_OR_DEPARTMENT_OTHER): Payer: BC Managed Care – PPO | Attending: Student | Admitting: Physical Therapy

## 2023-12-07 ENCOUNTER — Encounter (HOSPITAL_BASED_OUTPATIENT_CLINIC_OR_DEPARTMENT_OTHER): Payer: Self-pay | Admitting: Physical Therapy

## 2023-12-07 DIAGNOSIS — M25512 Pain in left shoulder: Secondary | ICD-10-CM | POA: Insufficient documentation

## 2023-12-07 DIAGNOSIS — R293 Abnormal posture: Secondary | ICD-10-CM | POA: Insufficient documentation

## 2023-12-07 DIAGNOSIS — M25612 Stiffness of left shoulder, not elsewhere classified: Secondary | ICD-10-CM | POA: Diagnosis not present

## 2023-12-12 ENCOUNTER — Ambulatory Visit (HOSPITAL_BASED_OUTPATIENT_CLINIC_OR_DEPARTMENT_OTHER): Admitting: Obstetrics & Gynecology

## 2023-12-12 ENCOUNTER — Other Ambulatory Visit (HOSPITAL_BASED_OUTPATIENT_CLINIC_OR_DEPARTMENT_OTHER)

## 2023-12-12 ENCOUNTER — Other Ambulatory Visit (HOSPITAL_BASED_OUTPATIENT_CLINIC_OR_DEPARTMENT_OTHER): Payer: Self-pay | Admitting: Obstetrics & Gynecology

## 2023-12-12 VITALS — BP 124/72 | HR 76 | Ht 64.0 in | Wt 182.0 lb

## 2023-12-12 DIAGNOSIS — N951 Menopausal and female climacteric states: Secondary | ICD-10-CM | POA: Diagnosis not present

## 2023-12-12 DIAGNOSIS — N950 Postmenopausal bleeding: Secondary | ICD-10-CM

## 2023-12-12 MED ORDER — VEOZAH 45 MG PO TABS: 1.0000 | ORAL_TABLET | Freq: Every day | ORAL | 2 refills | Status: DC

## 2023-12-13 ENCOUNTER — Ambulatory Visit (HOSPITAL_BASED_OUTPATIENT_CLINIC_OR_DEPARTMENT_OTHER): Payer: Self-pay | Admitting: Physical Therapy

## 2023-12-13 DIAGNOSIS — M25512 Pain in left shoulder: Secondary | ICD-10-CM

## 2023-12-13 DIAGNOSIS — M25612 Stiffness of left shoulder, not elsewhere classified: Secondary | ICD-10-CM

## 2023-12-13 DIAGNOSIS — R293 Abnormal posture: Secondary | ICD-10-CM | POA: Diagnosis not present

## 2023-12-13 NOTE — Therapy (Signed)
 OUTPATIENT PHYSICAL THERAPY UPPER EXTREMITY TREATMENT   Patient Name: Jamie Miller MRN: [id number] DOB:[birth year], 53 y.o., female Today's Date: 12/13/2023  END OF SESSION:  PT End of Session - 12/13/23 1139     Visit Number 6    Number of Visits 16    Date for PT Re-Evaluation 01/24/24    PT Start Time 1015    PT Stop Time 1057    PT Time Calculation (min) 42 min    Activity Tolerance Patient tolerated treatment well    Behavior During Therapy [facility] for tasks assessed/performed                  Past Medical History:  Diagnosis Date   Abnormal Pap smear of cervix    in 20's   Allergy    Anxiety    Asthma    outgrown   Bladder spasms 10/2017   doing pelvic floor PT   Chicken pox    OAB (overactive bladder)    Thyroid disease    was on low dose synthroid at one time, but removed and levels have been "normal" since and followed thorugh GYN   Vitamin D deficiency 07/13/2019   Past Surgical History:  Procedure Laterality Date   APPENDECTOMY  1990   BREAST BIOPSY  2000   biopsy   CERVICAL CERCLAGE  2009   HYMENECTOMY  2005   WISDOM TOOTH EXTRACTION     Patient Active Problem List   Diagnosis Date Noted   Anxiety 08/22/2023   OCD (obsessive compulsive disorder) 08/22/2023   Obesity 08/22/2023   History of vaginitis 10/26/2021   Allergy to environmental factors 09/25/2019   Spondylosis of lumbosacral region without myelopathy or radiculopathy 09/07/2017   Generalized abdominal pain 10/11/2016   Dyspareunia in female 08/25/2015   Vitamin D deficiency 04/16/2013   H/O: hypothyroidism 11/14/2012    PCP: Jarold Motto PA   REFERRING PROVIDER: Hazle Nordmann PA   REFERRING DIAG: Left Shoulder Pain    THERAPY DIAG:  Stiffness of left shoulder, not elsewhere classified  Abnormal posture  Acute pain of left shoulder  Rationale for Evaluation and Treatment: Rehabilitation  ONSET DATE: December 2024  SUBJECTIVE:                                                                                                                                                                                       SUBJECTIVE STATEMENT: The patient feels like the pain is more positional at this time. She has difficulty reaching across. She has bene using it more. Sh ehas been working on her exercises.    Patient reports in December she was lifting some furniture.  Shortly after she began to develop increased pain in her left shoulder.  She is left-hand dominant.  She reports over time the pain became progressively worse.  She has increased pain with certain movements.  At her last MD visit she had an injection in her shoulder.  She feels like this helped.  Also the MD gave her exercises to perform at home.  She feels like these also have helped.  She continues to have pain with endrange movements.   Hand dominance: Left  PERTINENT HISTORY: History of low back pain, anxiety  PAIN:  Are you having pain? Yes: NPRS scale: 5/10 Pain location: top of the shoulder and into the lateral upper arm  Pain description: aching  Aggravating factors: certain movements  Relieving factors: dont use it ads much   PRECAUTIONS: None  RED FLAGS: None   WEIGHT BEARING RESTRICTIONS: No  FALLS:  Has patient fallen in last 6 months? No  LIVING ENVIRONMENT: Nothing pertinent   OCCUPATION: Unemployed   Hobbies:  Reading and writing  Walking   PLOF: Independent  PATIENT GOALS:  To be able to use her arm without discomfort   NEXT MD VISIT: Nothing scheduled     OBJECTIVE:  Note: Objective measures were completed at Evaluation unless otherwise noted.  DIAGNOSTIC FINDINGS:  Xray (left shoulder 3 views): No evidence of acute abnormality.  Calcific tendonitis noted lateral to the humeral head.    PATIENT SURVEYS :  Eval: Quick dash 28/55  4/14  19/55   COGNITION: Overall cognitive status: Within functional limits for tasks  assessed     SENSATION: WFL  POSTURE: Rounded shoulders and forward head   UPPER EXTREMITY ROM:   Active ROM Right eval Left eval   Shoulder flexion  94 degrees with pain 104  Shoulder extension     Shoulder abduction     Shoulder adduction     Shoulder internal rotation  Can reach to L5 with pain Can reach to lower thoracic   Shoulder external rotation  Can reach behind her head but have significant pain Mild pain   Elbow flexion     Elbow extension     Wrist flexion     Wrist extension     Wrist ulnar deviation     Wrist radial deviation     Wrist pronation     Wrist supination     (Blank rows = not tested)   PROM  ROM Right eval Left eval  Shoulder flexion  120 degrees with pain  Shoulder extension    Shoulder abduction    Shoulder adduction    Shoulder internal rotation    Shoulder external rotation  20 degrees with pain but very guarded with this motion.  Able to do more actively when given for HEP  Elbow flexion    Elbow extension  20 degrees with pain but very guarded with this motion  Wrist flexion    Wrist extension    Wrist ulnar deviation    Wrist radial deviation    Wrist pronation    Wrist supination    (Blank rows = not tested) UPPER EXTREMITY MMT:  MMT Right eval Left eval left  Shoulder flexion 4+ 3 4  Shoulder extension     Shoulder abduction     Shoulder adduction     Shoulder internal rotation 4 3+ 4+  Shoulder external rotation 4 3 4   Middle trapezius     Lower trapezius     Elbow flexion  Elbow extension     Wrist flexion     Wrist extension     Wrist ulnar deviation     Wrist radial deviation     Wrist pronation     Wrist supination     Grip strength (lbs)     (Blank rows = not tested)  SHOULDER SPECIAL TESTS: Tested by PA  JOINT MOBILITY TESTING:    PALPATION:  Mild tenderness to palpation in upper.  Spasm in teres/infraspinatus area                                                                                                                              TREATMENT DATE:  4/10 Manual: STM to L UT, LS PROM into ER and flexion with distraction. Grade 1 and II inferior and posterior glides                       Reviewed self trigger point release with there-cane. Reviewed how to purchase thera-cane                         Trigger point release to upper trap   There-ex:  Wand press 2x10 mod cuing not to go thorugh pain free spot  Wand press to flexion 2x10  Behind the back cane stretch 5x5 sec hold  Actice ER 2x10   Reviewed HEP    Neur-re-ed:  Row red  band. Cuing to leave shoulders down 3x10  Shoulder extension red 2x10    [facility] Adult PT Treatment:                                                DATE: 12/07/23 Therapeutic Exercise: Swiss ball fwd flexion 2x10 cues for breath control and comfortable ROM Red band row 2x8 cues for posture Yellow band bicep curl 2x8 Swiss ball abduction rollout x10  cues for setup and comfortable ROM Significant time spent w/ education/discussion for HEP, strategies to maximize adherence/performance  Manual Therapy: Education/discussion re: theracane use for symptom modification, indications and rationale for use as needed    3/21 Manual: STM to L UT, LS PROM into ER and flexion with distraction. Grade 1 and II inferior and posterior glides  Reviewed self trigger point release with there-cane. Reviewed how to purchase thera-cane   Reviewed anatomy of the upper trap and referral patterns   There-ex:  Reviewed behind the  back stretching with wand. Patient had difficulty with technique and it caused significant soreness in the shoulder   Neur-re-ed:  Mutli pane ball roll 3x10 reported mild pain  Row yellow band. Cuing to leave shoulders down 3x10        PATIENT EDUCATION: Education details: HEP, symptom management, importance of movement within pain-free ranges, Person educated: Patient Education method: Explanation,  Demonstration, Tactile  cues, Verbal cues, and Handouts Education comprehension: verbalized understanding, returned demonstration, verbal cues required, and tactile cues required  HOME EXERCISE PROGRAM: Access Code: [email or web address] URL: [email or web address] Date: 12/07/2023 Prepared by: Fransisco Hertz  Program Notes - can use yellow or red band for scapular retraction and shoulder extension, based on comfort- can use yellow band for elbow flexion (bicep curls)  Exercises - Seated Shoulder Flexion Towel Slide at Table Top  - 2-3 x daily - 1 sets - 8 reps - Seated Upper Trapezius Stretch  - 2-3 x daily - 3 sets - 30seconds hold - Gentle Levator Scapulae Stretch  - 2-3 x daily - 3 sets - 30 seconds hold - Corner Pec Minor Stretch  - 2-3 x daily - 3-5 sets - 10-15seconds hold - Theracane Over Shoulder  - 1 x daily - 7 x weekly - 3 sets - 1 reps - min  hold - Scapular Retraction with Resistance  - 3-4 x weekly - 2-3 sets - 10 reps - Shoulder extension with resistance - Neutral  - 3-4 x weekly - 2-3 sets - 10 reps - Standing Single Arm Elbow Flexion with Resistance  - 3-4 x weekly - 2-3 sets - 6 reps  ASSESSMENT:  CLINICAL IMPRESSION: The patient has made progress. Her quick dash has improved. Her active flexion has improved. She has shown strength gains in several different planes. She still is having pain but it is more positional. She reports her pain is about a 3/10 today. We reviewed her HEP. She has advanced her bands to red. We were able to give her the behind the back wad stretch today. Overall she is making progress. She would benefit from further skilled therapy 1W6. See below for goal specific progress      Eval: Patient is a 53 year old female with acute onset of left shoulder pain in December 2024.  At this time she has limited active and passive range of motion in flexion and external rotation.  Tenderness to palpation in her upper trap exterior rotator cuff muscles.  She has limited functional  use of her dominant arm.  She has had some improvement with injections and exercises given to her by her provider.  She is given exercises for home to focus on pain-free movement.  She required some cueing to keep her movements pain-free.  She would benefit from further skilled therapy to improve ability to use dominant arm.  OBJECTIVE IMPAIRMENTS: decreased activity tolerance, decreased ROM, decreased strength, decreased safety awareness, and postural dysfunction.  Impaired use of left upper extremity  ACTIVITY LIMITATIONS: carrying, lifting, dressing, self feeding, reach over head, and hygiene/grooming  PARTICIPATION LIMITATIONS: meal prep, cleaning, laundry, driving, shopping, and community activity  PERSONAL FACTORS: 1 comorbidity: Anxiety  are also affecting patient's functional outcome.   REHAB POTENTIAL: Good  CLINICAL DECISION MAKING: Evolving/moderate complexity progressive loss over time of dominant arm  EVALUATION COMPLEXITY: Moderate  GOALS: Goals reviewed with patient? Yes  SHORT TERM GOALS: Target date: 11/19/2023-    Patient will increase left active shoulder flexion by 20 degrees Baseline: Goal status: improving progressing but not met 4/10  2.  Patient will increase gross left shoulder to 5/5  Baseline:  Goal status: progressing strengthening achieved New goal above 4/10   3.  Patient will be independent with base exercise program Baseline:  Goal status achieved 4/10    LONG TERM GOALS: Target date: 12/17/2023    Patient will reach behind her head with left arm without pain  Baseline:  Goal status: INITIAL  2.  Patient will use left arm for all ADLs without pain Baseline:  Goal status: INITIAL  3.  Patient will demonstrate 4+ out of 5 gross left upper extremity strength in order to perform daily tasks without increased pain Baseline:  Goal status: INITIAL  PLAN: PT FREQUENCY: 1-2x/week  PT DURATION: 8 weeks  PLANNED INTERVENTIONS:  97110-Therapeutic exercises, 97530- Therapeutic activity, [id number]- Neuromuscular re-education, 97535- Self Care, 16109- Manual therapy, [id number]- Aquatic Therapy, 97014- Electrical stimulation (unattended), 97035- Ultrasound, Patient/Family education, Taping, Dry Needling, DME instructions, Cryotherapy, and Moist heat   PLAN FOR NEXT SESSION: Patient was guarded with passive range of motion.  She may do better with active assisted or active range of motion.  Consider pulleys next visit.  Consider advancing scapular exercises.  Consider shoulder extension to neutral.  The patient does well with active ER consider progressing to side-lying ER to add gravity for resistance.  Adjust HEP according to tolerance.   Lorayne Bender PT DPT 12/13/2023 11:41 AM

## 2023-12-14 ENCOUNTER — Encounter (HOSPITAL_BASED_OUTPATIENT_CLINIC_OR_DEPARTMENT_OTHER): Payer: Self-pay | Admitting: Obstetrics & Gynecology

## 2023-12-14 NOTE — Progress Notes (Signed)
 GYNECOLOGY  VISIT  CC:   Discuss ultrasound results, PMP bleeding  HPI: 53 y.o. G25P0101 Married White or Caucasian female here for discussion of ultrasound results.  Ultrasound obtained due to episode of PMP bleeding.  Endometrial biopsy was recommended but pt was reluctant to proceed so ultrasound was offered.  Husband accompanied her today.    Ultrasound showed small uterus with 2.26mm endometrial thickness.  Ovaries were not well visualized due to bowel gas.  Images reviewed.  Findings discussed.  Do not feel she needs to proceed with biopsy at this time.  Questions answered.  She is having issues with vasomotor symptoms and wants to discuss options.  Not interested in HRT at this time.  Gabapentin, SSRI and Veozah discussed.  Not sure she wants to try anything but veozah would be first.  Recommended going ahead and ordering this to see about coverage for her.  Looks like will need a prior authorization but would be helpful to know how many medications she needs to fail first.  She is comfortable with plan.   Past Medical History:  Diagnosis Date   Abnormal Pap smear of cervix    in 20's   Allergy    Anxiety    Asthma    outgrown   Bladder spasms 10/2017   doing pelvic floor PT   Chicken pox    OAB (overactive bladder)    Thyroid disease    was on low dose synthroid at one time, but removed and levels have been "normal" since and followed thorugh GYN   Vitamin D deficiency 07/13/2019    MEDS:   Current Outpatient Medications on File Prior to Visit  Medication Sig Dispense Refill   betamethasone dipropionate 0.05 % lotion Apply topically 2 (two) times daily.     loratadine-pseudoephedrine (CLARITIN-D 24 HOUR) 10-240 MG 24 hr tablet Claritin-D 24 Hour     Magnesium 200 MG TABS Take 1 tablet by mouth daily in the afternoon.     Multiple Vitamin (MULTIVITAMIN) tablet Take 1 tablet by mouth daily.     Probiotic Product (ACIDOPHILUS/GOAT MILK) CAPS Take by mouth.     VITAMIN D PO  Take 1 capsule by mouth daily in the afternoon.     No current facility-administered medications on file prior to visit.    ALLERGIES: Dairy aid  [tilactase], Egg-derived products, Hemp seed oil  [dronabinol], Levothyroxine, Metrogel [metronidazole], Nitrofurantoin monohyd macro, Strawberry (diagnostic), Wheat, Almond oil, Keflex [cephalexin], Nickel, Other, Clindamycin, Clindamycin hcl, and Cranberry  SH:  married, non smoker  Review of Systems  Constitutional:        Hot flashes, night sweats    PHYSICAL EXAMINATION:    BP 124/72 (BP Location: Left Arm, Patient Position: Sitting, Cuff Size: Normal)   Pulse 76   Ht 5\' 4"  (1.626 m)   Wt 182 lb (82.6 kg)   LMP 07/27/2022   BMI 31.24 kg/m      Physical Exam Constitutional:      Appearance: Normal appearance.  Neurological:     General: No focal deficit present.     Mental Status: She is alert.  Psychiatric:        Mood and Affect: Mood normal.     Assessment/Plan: 1. Postmenopausal bleeding (Primary) - endometrium thin on ultrasound.  Do not feel she needs to proceed with biopsy at this time.  If this is recurrent, I would then recommend a biopsy.    2. Vasomotor symptoms due to menopause - will send in rx for  veozah to see if covered or to see if she needs to fail other options first.  She  - Fezolinetant (VEOZAH) 45 MG TABS; Take 1 tablet (45 mg total) by mouth daily in the afternoon.  Dispense: 30 tablet; Refill: 2

## 2024-01-02 NOTE — Telephone Encounter (Signed)
 Prior-Authorization sent. Patient needs to try and fail at least 2 SSRI. Waiting on insurance approval or denial. tbw

## 2024-01-09 NOTE — Telephone Encounter (Signed)
 Called and [facility] for patient to call office. Would like to discuss option with patient. tbw

## 2024-02-13 ENCOUNTER — Ambulatory Visit (HOSPITAL_BASED_OUTPATIENT_CLINIC_OR_DEPARTMENT_OTHER): Payer: Self-pay

## 2024-02-13 ENCOUNTER — Encounter (HOSPITAL_BASED_OUTPATIENT_CLINIC_OR_DEPARTMENT_OTHER): Payer: Self-pay | Admitting: Obstetrics & Gynecology

## 2024-02-13 ENCOUNTER — Encounter (HOSPITAL_BASED_OUTPATIENT_CLINIC_OR_DEPARTMENT_OTHER): Payer: Self-pay

## 2024-02-13 VITALS — BP 116/74 | HR 97 | Ht 65.0 in | Wt 187.2 lb

## 2024-02-13 DIAGNOSIS — R3915 Urgency of urination: Secondary | ICD-10-CM | POA: Diagnosis not present

## 2024-02-13 LAB — POCT URINALYSIS DIPSTICK
Bilirubin, UA: NEGATIVE
Blood, UA: NEGATIVE
Glucose, UA: NEGATIVE
Ketones, UA: NEGATIVE
Nitrite, UA: NEGATIVE
Protein, UA: NEGATIVE
Spec Grav, UA: <=1.005 — AB (ref 1.010–1.025)
Urobilinogen, UA: 0.2 U/dL
pH, UA: 5.5 (ref 5.0–8.0)

## 2024-02-13 NOTE — Progress Notes (Signed)
 NURSE VISIT- UTI SYMPTOMS   SUBJECTIVE:  Jamie Miller is a 53 y.o. G63P0101 female here for UTI symptoms. She is a GYN patient. She reports urinary frequency.  OBJECTIVE:  BP 116/74 (BP Location: Right Arm, Patient Position: Sitting, Cuff Size: Large)   Pulse 97   Ht 5' 5 (1.651 m) Comment: Reported  Wt 187 lb 3.2 oz (84.9 kg)   LMP 07/27/2022   BMI 31.15 kg/m   Appears well, in no apparent distress  No results found for this or any previous visit (from the past 24 hours).  ASSESSMENT: GYN patient with UTI symptoms and negative nitrites  PLAN: Visit routed to or discussed with:  Rx sent today: No Urine culture not sent Call or return to clinic prn if these symptoms worsen or fail to improve as anticipated. Follow-up: as needed

## 2024-02-16 LAB — URINE CULTURE

## 2024-02-18 ENCOUNTER — Ambulatory Visit (HOSPITAL_BASED_OUTPATIENT_CLINIC_OR_DEPARTMENT_OTHER): Payer: Self-pay | Admitting: Obstetrics & Gynecology

## 2024-02-18 ENCOUNTER — Other Ambulatory Visit (HOSPITAL_BASED_OUTPATIENT_CLINIC_OR_DEPARTMENT_OTHER): Payer: Self-pay | Admitting: Obstetrics & Gynecology

## 2024-02-18 ENCOUNTER — Encounter (HOSPITAL_BASED_OUTPATIENT_CLINIC_OR_DEPARTMENT_OTHER): Payer: Self-pay | Admitting: Obstetrics & Gynecology

## 2024-02-18 DIAGNOSIS — N3000 Acute cystitis without hematuria: Secondary | ICD-10-CM

## 2024-02-18 MED ORDER — AMOXICILLIN-POT CLAVULANATE 875-125 MG PO TABS: 1.0000 | ORAL_TABLET | Freq: Two times a day (BID) | ORAL | 0 refills | Status: DC

## 2024-02-20 ENCOUNTER — Other Ambulatory Visit (HOSPITAL_BASED_OUTPATIENT_CLINIC_OR_DEPARTMENT_OTHER): Payer: Self-pay | Admitting: Obstetrics & Gynecology

## 2024-02-20 DIAGNOSIS — N3000 Acute cystitis without hematuria: Secondary | ICD-10-CM

## 2024-02-20 MED ORDER — AMOXICILLIN 250 MG/5ML PO SUSR: 875.0000 mg | Freq: Two times a day (BID) | ORAL | 0 refills | Status: DC

## 2024-02-29 ENCOUNTER — Ambulatory Visit (HOSPITAL_BASED_OUTPATIENT_CLINIC_OR_DEPARTMENT_OTHER)

## 2024-02-29 VITALS — BP 111/78 | HR 90 | Wt 188.0 lb

## 2024-02-29 DIAGNOSIS — R300 Dysuria: Secondary | ICD-10-CM | POA: Diagnosis not present

## 2024-02-29 LAB — POCT URINALYSIS DIPSTICK
Bilirubin, UA: NEGATIVE
Blood, UA: NEGATIVE
Glucose, UA: NEGATIVE
Ketones, UA: NEGATIVE
Leukocytes, UA: NEGATIVE
Nitrite, UA: NEGATIVE
Odor: NEGATIVE
Protein, UA: NEGATIVE
Spec Grav, UA: <=1.005 — AB (ref 1.010–1.025)
Urobilinogen, UA: 0.2 U/dL
pH, UA: 5.5 (ref 5.0–8.0)

## 2024-02-29 NOTE — Progress Notes (Signed)
 NURSE VISIT- UTI SYMPTOMS   SUBJECTIVE:  Jamie Miller is a 53 y.o. G58P0101 female here for UTI symptoms. She is a GYN patient. She reports dysuria.  OBJECTIVE:  Wt 188 lb (85.3 kg)   LMP 07/27/2022   BMI 31.28 kg/m   Appears well, in no apparent distress  No results found for this or any previous visit (from the past 24 hours).  ASSESSMENT: GYN patient with UTI symptoms and negative nitrites  PLAN: Visit routed to or discussed with:  Rx sent today: No Urine culture sent Call or return to clinic prn if these symptoms worsen or fail to improve as anticipated. Follow-up: as needed

## 2024-03-02 LAB — URINE CULTURE

## 2024-03-03 ENCOUNTER — Ambulatory Visit (HOSPITAL_BASED_OUTPATIENT_CLINIC_OR_DEPARTMENT_OTHER): Payer: Self-pay | Admitting: Certified Nurse Midwife

## 2024-03-11 DIAGNOSIS — F5089 Other specified eating disorder: Secondary | ICD-10-CM | POA: Diagnosis not present

## 2024-03-11 DIAGNOSIS — F4312 Post-traumatic stress disorder, chronic: Secondary | ICD-10-CM | POA: Diagnosis not present

## 2024-03-11 DIAGNOSIS — F411 Generalized anxiety disorder: Secondary | ICD-10-CM | POA: Diagnosis not present

## 2024-03-18 DIAGNOSIS — F411 Generalized anxiety disorder: Secondary | ICD-10-CM | POA: Diagnosis not present

## 2024-03-18 DIAGNOSIS — F4312 Post-traumatic stress disorder, chronic: Secondary | ICD-10-CM | POA: Diagnosis not present

## 2024-03-18 DIAGNOSIS — F5089 Other specified eating disorder: Secondary | ICD-10-CM | POA: Diagnosis not present

## 2024-03-25 DIAGNOSIS — F411 Generalized anxiety disorder: Secondary | ICD-10-CM | POA: Diagnosis not present

## 2024-03-25 DIAGNOSIS — F5089 Other specified eating disorder: Secondary | ICD-10-CM | POA: Diagnosis not present

## 2024-03-25 DIAGNOSIS — F4312 Post-traumatic stress disorder, chronic: Secondary | ICD-10-CM | POA: Diagnosis not present

## 2024-03-25 DIAGNOSIS — Z713 Dietary counseling and surveillance: Secondary | ICD-10-CM | POA: Diagnosis not present

## 2024-04-04 ENCOUNTER — Encounter: Payer: Self-pay | Admitting: Physician Assistant

## 2024-04-04 DIAGNOSIS — F4312 Post-traumatic stress disorder, chronic: Secondary | ICD-10-CM | POA: Diagnosis not present

## 2024-04-04 DIAGNOSIS — F411 Generalized anxiety disorder: Secondary | ICD-10-CM | POA: Diagnosis not present

## 2024-04-04 DIAGNOSIS — F5089 Other specified eating disorder: Secondary | ICD-10-CM | POA: Diagnosis not present

## 2024-04-08 ENCOUNTER — Other Ambulatory Visit: Payer: Self-pay | Admitting: Physician Assistant

## 2024-04-08 DIAGNOSIS — Z713 Dietary counseling and surveillance: Secondary | ICD-10-CM | POA: Diagnosis not present

## 2024-04-08 DIAGNOSIS — R5383 Other fatigue: Secondary | ICD-10-CM

## 2024-04-08 NOTE — Telephone Encounter (Signed)
 Please see message, okay to order labs?

## 2024-04-11 DIAGNOSIS — F411 Generalized anxiety disorder: Secondary | ICD-10-CM | POA: Diagnosis not present

## 2024-04-11 DIAGNOSIS — F4312 Post-traumatic stress disorder, chronic: Secondary | ICD-10-CM | POA: Diagnosis not present

## 2024-04-11 DIAGNOSIS — F5089 Other specified eating disorder: Secondary | ICD-10-CM | POA: Diagnosis not present

## 2024-04-15 DIAGNOSIS — Z713 Dietary counseling and surveillance: Secondary | ICD-10-CM | POA: Diagnosis not present

## 2024-04-16 ENCOUNTER — Other Ambulatory Visit

## 2024-04-19 DIAGNOSIS — F422 Mixed obsessional thoughts and acts: Secondary | ICD-10-CM | POA: Diagnosis not present

## 2024-04-19 DIAGNOSIS — F411 Generalized anxiety disorder: Secondary | ICD-10-CM | POA: Diagnosis not present

## 2024-04-19 DIAGNOSIS — F5089 Other specified eating disorder: Secondary | ICD-10-CM | POA: Diagnosis not present

## 2024-04-19 DIAGNOSIS — F4312 Post-traumatic stress disorder, chronic: Secondary | ICD-10-CM | POA: Diagnosis not present

## 2024-04-22 DIAGNOSIS — Z713 Dietary counseling and surveillance: Secondary | ICD-10-CM | POA: Diagnosis not present

## 2024-04-24 DIAGNOSIS — F411 Generalized anxiety disorder: Secondary | ICD-10-CM | POA: Diagnosis not present

## 2024-04-24 DIAGNOSIS — F4312 Post-traumatic stress disorder, chronic: Secondary | ICD-10-CM | POA: Diagnosis not present

## 2024-04-24 DIAGNOSIS — F5089 Other specified eating disorder: Secondary | ICD-10-CM | POA: Diagnosis not present

## 2024-04-24 DIAGNOSIS — F422 Mixed obsessional thoughts and acts: Secondary | ICD-10-CM | POA: Diagnosis not present

## 2024-04-29 DIAGNOSIS — F422 Mixed obsessional thoughts and acts: Secondary | ICD-10-CM | POA: Diagnosis not present

## 2024-04-29 DIAGNOSIS — F5089 Other specified eating disorder: Secondary | ICD-10-CM | POA: Diagnosis not present

## 2024-04-29 DIAGNOSIS — F411 Generalized anxiety disorder: Secondary | ICD-10-CM | POA: Diagnosis not present

## 2024-04-29 DIAGNOSIS — F4312 Post-traumatic stress disorder, chronic: Secondary | ICD-10-CM | POA: Diagnosis not present

## 2024-04-29 DIAGNOSIS — Z713 Dietary counseling and surveillance: Secondary | ICD-10-CM | POA: Diagnosis not present

## 2024-05-02 ENCOUNTER — Ambulatory Visit: Payer: Self-pay | Admitting: Physician Assistant

## 2024-05-02 ENCOUNTER — Other Ambulatory Visit

## 2024-05-02 DIAGNOSIS — R5383 Other fatigue: Secondary | ICD-10-CM | POA: Diagnosis not present

## 2024-05-02 LAB — IBC + FERRITIN
Ferritin: 50.9 ng/mL (ref 10.0–291.0)
Iron: 93 ug/dL (ref 42–145)
Saturation Ratios: 27.9 % (ref 20.0–50.0)
TIBC: 333.2 ug/dL (ref 250.0–450.0)
Transferrin: 238 mg/dL (ref 212.0–360.0)

## 2024-05-02 LAB — B12 AND FOLATE PANEL
Folate: >22.9 ng/mL (ref >5.9)
Vitamin B-12: 331 pg/mL (ref 211–911)

## 2024-05-07 LAB — VITAMIN B6: Vitamin B6: 30.8 ng/mL — ABNORMAL HIGH (ref 2.1–21.7)

## 2024-05-08 DIAGNOSIS — F422 Mixed obsessional thoughts and acts: Secondary | ICD-10-CM | POA: Diagnosis not present

## 2024-05-08 DIAGNOSIS — F411 Generalized anxiety disorder: Secondary | ICD-10-CM | POA: Diagnosis not present

## 2024-05-08 DIAGNOSIS — F4312 Post-traumatic stress disorder, chronic: Secondary | ICD-10-CM | POA: Diagnosis not present

## 2024-05-08 DIAGNOSIS — F5089 Other specified eating disorder: Secondary | ICD-10-CM | POA: Diagnosis not present

## 2024-05-15 DIAGNOSIS — F4312 Post-traumatic stress disorder, chronic: Secondary | ICD-10-CM | POA: Diagnosis not present

## 2024-05-15 DIAGNOSIS — F5089 Other specified eating disorder: Secondary | ICD-10-CM | POA: Diagnosis not present

## 2024-05-15 DIAGNOSIS — F422 Mixed obsessional thoughts and acts: Secondary | ICD-10-CM | POA: Diagnosis not present

## 2024-05-15 DIAGNOSIS — F411 Generalized anxiety disorder: Secondary | ICD-10-CM | POA: Diagnosis not present

## 2024-05-22 DIAGNOSIS — F5089 Other specified eating disorder: Secondary | ICD-10-CM | POA: Diagnosis not present

## 2024-05-22 DIAGNOSIS — F422 Mixed obsessional thoughts and acts: Secondary | ICD-10-CM | POA: Diagnosis not present

## 2024-05-22 DIAGNOSIS — F4312 Post-traumatic stress disorder, chronic: Secondary | ICD-10-CM | POA: Diagnosis not present

## 2024-05-22 DIAGNOSIS — F411 Generalized anxiety disorder: Secondary | ICD-10-CM | POA: Diagnosis not present

## 2024-05-27 DIAGNOSIS — Z713 Dietary counseling and surveillance: Secondary | ICD-10-CM | POA: Diagnosis not present

## 2024-05-29 DIAGNOSIS — F4312 Post-traumatic stress disorder, chronic: Secondary | ICD-10-CM | POA: Diagnosis not present

## 2024-05-29 DIAGNOSIS — F422 Mixed obsessional thoughts and acts: Secondary | ICD-10-CM | POA: Diagnosis not present

## 2024-05-29 DIAGNOSIS — F5089 Other specified eating disorder: Secondary | ICD-10-CM | POA: Diagnosis not present

## 2024-06-05 DIAGNOSIS — F5089 Other specified eating disorder: Secondary | ICD-10-CM | POA: Diagnosis not present

## 2024-06-05 DIAGNOSIS — F4312 Post-traumatic stress disorder, chronic: Secondary | ICD-10-CM | POA: Diagnosis not present

## 2024-06-05 DIAGNOSIS — F422 Mixed obsessional thoughts and acts: Secondary | ICD-10-CM | POA: Diagnosis not present

## 2024-06-11 DIAGNOSIS — Z713 Dietary counseling and surveillance: Secondary | ICD-10-CM | POA: Diagnosis not present

## 2024-06-12 ENCOUNTER — Encounter (HOSPITAL_BASED_OUTPATIENT_CLINIC_OR_DEPARTMENT_OTHER): Payer: Self-pay | Admitting: Obstetrics & Gynecology

## 2024-06-13 ENCOUNTER — Other Ambulatory Visit (HOSPITAL_BASED_OUTPATIENT_CLINIC_OR_DEPARTMENT_OTHER): Payer: Self-pay

## 2024-06-13 ENCOUNTER — Ambulatory Visit (INDEPENDENT_AMBULATORY_CARE_PROVIDER_SITE_OTHER)

## 2024-06-13 ENCOUNTER — Encounter (HOSPITAL_BASED_OUTPATIENT_CLINIC_OR_DEPARTMENT_OTHER): Payer: Self-pay

## 2024-06-13 VITALS — BP 123/81 | HR 105 | Wt 189.0 lb

## 2024-06-13 DIAGNOSIS — R399 Unspecified symptoms and signs involving the genitourinary system: Secondary | ICD-10-CM

## 2024-06-13 LAB — POCT URINALYSIS DIP (CLINITEK)
Bilirubin, UA: NEGATIVE
Blood, UA: NEGATIVE
Glucose, UA: NEGATIVE mg/dL
Ketones, POC UA: NEGATIVE mg/dL
Nitrite, UA: NEGATIVE
POC PROTEIN,UA: NEGATIVE
Spec Grav, UA: 1.02 (ref 1.010–1.025)
Urobilinogen, UA: 0.2 U/dL
pH, UA: 7 (ref 5.0–8.0)

## 2024-06-13 MED ORDER — SULFAMETHOXAZOLE-TRIMETHOPRIM 800-160 MG PO TABS: 1.0000 | ORAL_TABLET | Freq: Two times a day (BID) | ORAL | 0 refills | Status: DC

## 2024-06-13 NOTE — Progress Notes (Signed)
 NURSE VISIT- UTI SYMPTOMS   SUBJECTIVE:  Jamie Miller is a 53 y.o. G32P0101 female here for UTI symptoms. She is a GYN patient. She reports urinary frequency and urinary urgency.  OBJECTIVE:  BP 123/81 (BP Location: Left Arm, Patient Position: Sitting, Cuff Size: Normal)   Pulse (!) 105   Wt 189 lb (85.7 kg)   LMP 07/27/2022   SpO2 98%   BMI 31.45 kg/m   Appears well, in no apparent distress  Results for orders placed or performed in visit on 06/13/24 (from the past 24 hours)  POCT URINALYSIS DIP (CLINITEK)   Collection Time: 06/13/24  9:39 AM  Result Value Ref Range   Color, UA yellow yellow   Clarity, UA clear clear   Glucose, UA negative negative mg/dL   Bilirubin, UA negative negative   Ketones, POC UA negative negative mg/dL   Spec Grav, UA 8.979 8.989 - 1.025   Blood, UA negative negative   pH, UA 7.0 5.0 - 8.0   POC PROTEIN,UA negative negative, trace   Urobilinogen, UA 0.2 0.2 or 1.0 E.U./dL   Nitrite, UA Negative Negative   Leukocytes, UA Trace (A) Negative    ASSESSMENT: GYN patient with UTI symptoms and negative nitrites.  PLAN: Visit routed to or discussed with: Arland Roller, CNM Rx sent today: Yes Urine culture sent Call or return to clinic prn if these symptoms worsen or fail to improve as anticipated. Follow-up: as needed

## 2024-06-15 LAB — URINE CULTURE

## 2024-06-16 ENCOUNTER — Ambulatory Visit (HOSPITAL_BASED_OUTPATIENT_CLINIC_OR_DEPARTMENT_OTHER): Payer: Self-pay | Admitting: Certified Nurse Midwife

## 2024-06-17 DIAGNOSIS — Z713 Dietary counseling and surveillance: Secondary | ICD-10-CM | POA: Diagnosis not present

## 2024-06-19 DIAGNOSIS — F4312 Post-traumatic stress disorder, chronic: Secondary | ICD-10-CM | POA: Diagnosis not present

## 2024-06-19 DIAGNOSIS — F422 Mixed obsessional thoughts and acts: Secondary | ICD-10-CM | POA: Diagnosis not present

## 2024-06-19 DIAGNOSIS — F5089 Other specified eating disorder: Secondary | ICD-10-CM | POA: Diagnosis not present

## 2024-06-24 DIAGNOSIS — Z713 Dietary counseling and surveillance: Secondary | ICD-10-CM | POA: Diagnosis not present

## 2024-06-26 DIAGNOSIS — F422 Mixed obsessional thoughts and acts: Secondary | ICD-10-CM | POA: Diagnosis not present

## 2024-06-26 DIAGNOSIS — F4312 Post-traumatic stress disorder, chronic: Secondary | ICD-10-CM | POA: Diagnosis not present

## 2024-06-26 DIAGNOSIS — F5089 Other specified eating disorder: Secondary | ICD-10-CM | POA: Diagnosis not present

## 2024-06-26 DIAGNOSIS — F411 Generalized anxiety disorder: Secondary | ICD-10-CM | POA: Diagnosis not present

## 2024-07-01 DIAGNOSIS — Z713 Dietary counseling and surveillance: Secondary | ICD-10-CM | POA: Diagnosis not present

## 2024-07-03 DIAGNOSIS — F4312 Post-traumatic stress disorder, chronic: Secondary | ICD-10-CM | POA: Diagnosis not present

## 2024-07-03 DIAGNOSIS — F422 Mixed obsessional thoughts and acts: Secondary | ICD-10-CM | POA: Diagnosis not present

## 2024-07-03 DIAGNOSIS — F411 Generalized anxiety disorder: Secondary | ICD-10-CM | POA: Diagnosis not present

## 2024-07-03 DIAGNOSIS — F5089 Other specified eating disorder: Secondary | ICD-10-CM | POA: Diagnosis not present

## 2024-07-07 ENCOUNTER — Encounter: Payer: Self-pay | Admitting: Radiology

## 2024-07-07 DIAGNOSIS — Z713 Dietary counseling and surveillance: Secondary | ICD-10-CM | POA: Diagnosis not present

## 2024-07-10 DIAGNOSIS — F422 Mixed obsessional thoughts and acts: Secondary | ICD-10-CM | POA: Diagnosis not present

## 2024-07-10 DIAGNOSIS — F4312 Post-traumatic stress disorder, chronic: Secondary | ICD-10-CM | POA: Diagnosis not present

## 2024-07-10 DIAGNOSIS — F5089 Other specified eating disorder: Secondary | ICD-10-CM | POA: Diagnosis not present

## 2024-07-15 DIAGNOSIS — Z713 Dietary counseling and surveillance: Secondary | ICD-10-CM | POA: Diagnosis not present

## 2024-07-17 DIAGNOSIS — F422 Mixed obsessional thoughts and acts: Secondary | ICD-10-CM | POA: Diagnosis not present

## 2024-07-17 DIAGNOSIS — F5089 Other specified eating disorder: Secondary | ICD-10-CM | POA: Diagnosis not present

## 2024-07-17 DIAGNOSIS — F4312 Post-traumatic stress disorder, chronic: Secondary | ICD-10-CM | POA: Diagnosis not present

## 2024-07-21 ENCOUNTER — Ambulatory Visit (HOSPITAL_BASED_OUTPATIENT_CLINIC_OR_DEPARTMENT_OTHER): Payer: BC Managed Care – PPO | Admitting: Obstetrics & Gynecology

## 2024-07-22 DIAGNOSIS — Z713 Dietary counseling and surveillance: Secondary | ICD-10-CM | POA: Diagnosis not present

## 2024-07-23 ENCOUNTER — Encounter (HOSPITAL_BASED_OUTPATIENT_CLINIC_OR_DEPARTMENT_OTHER): Payer: Self-pay | Admitting: Obstetrics & Gynecology

## 2024-07-23 ENCOUNTER — Ambulatory Visit (INDEPENDENT_AMBULATORY_CARE_PROVIDER_SITE_OTHER): Admitting: Obstetrics & Gynecology

## 2024-07-23 VITALS — BP 131/88 | HR 100 | Ht 65.0 in | Wt 189.6 lb

## 2024-07-23 DIAGNOSIS — Z01419 Encounter for gynecological examination (general) (routine) without abnormal findings: Secondary | ICD-10-CM

## 2024-07-23 DIAGNOSIS — F429 Obsessive-compulsive disorder, unspecified: Secondary | ICD-10-CM | POA: Diagnosis not present

## 2024-07-23 DIAGNOSIS — R3915 Urgency of urination: Secondary | ICD-10-CM | POA: Diagnosis not present

## 2024-07-23 DIAGNOSIS — N952 Postmenopausal atrophic vaginitis: Secondary | ICD-10-CM | POA: Diagnosis not present

## 2024-07-23 DIAGNOSIS — Z1331 Encounter for screening for depression: Secondary | ICD-10-CM | POA: Diagnosis not present

## 2024-07-23 LAB — POCT URINALYSIS DIP (CLINITEK)
Bilirubin, UA: NEGATIVE
Blood, UA: NEGATIVE
Glucose, UA: NEGATIVE mg/dL
Ketones, POC UA: NEGATIVE mg/dL
Leukocytes, UA: NEGATIVE
Nitrite, UA: NEGATIVE
POC PROTEIN,UA: NEGATIVE
Spec Grav, UA: 1.02 (ref 1.010–1.025)
Urobilinogen, UA: 0.2 U/dL
pH, UA: 7 (ref 5.0–8.0)

## 2024-07-23 MED ORDER — ESTRADIOL 0.01 % VA CREA: TOPICAL_CREAM | VAGINAL | 3 refills | Status: AC

## 2024-07-23 NOTE — Progress Notes (Signed)
 ANNUAL EXAM Patient name: Jamie Miller MRN [id number]  Date of birth: April 10, 1971 Chief Complaint:   Annual Exam  History of Present Illness:   Jamie Miller is a 53 y.o. G30P0101 Caucasian female being seen today for a routine annual exam.  Denies vaginal bleeding.  Having urinary urgency and frequency.  Normal urinalysis in office today.  Discussed vaginal estrogen cream.  Seeing therapist for OCD concerns.  Having the most trouble with driving.  Is open to medications but not at that point yet.   Patient's last menstrual period was 07/27/2022.  Last pap 05/15/2022. Results were: ASCUS w/ HRHPV negative. H/O abnormal pap: yes Last mammogram: 07/05/2023. Results were: normal. Family h/o breast cancer: no Last colonoscopy:  10/2020. Results were: normal. Family h/o colorectal cancer: no     07/23/2024   10:19 AM 02/29/2024   12:01 PM 10/23/2023    9:24 AM 08/22/2023    8:46 AM 07/12/2023    1:19 PM  Depression screen PHQ 2/9  Decreased Interest 0 0 0 0 0  Down, Depressed, Hopeless 0 0 0 0 0  PHQ - 2 Score 0 0 0 0 0  Altered sleeping    0   Tired, decreased energy    1   Change in appetite    2   Feeling bad or failure about yourself     0   Trouble concentrating    0   Moving slowly or fidgety/restless    0   Suicidal thoughts    0   PHQ-9 Score    3    Difficult doing work/chores    Not difficult at all      Data saved with a previous flowsheet row definition        07/23/2024   10:19 AM 08/22/2023    8:46 AM 06/04/2023    2:45 PM  GAD 7 : Generalized Anxiety Score  Nervous, Anxious, on Edge 1 1 0  Control/stop worrying 0 0 0  Worry too much - different things 1 1 0  Trouble relaxing 0 0 0  Restless 0 0 0  Easily annoyed or irritable 1 1   Afraid - awful might happen 1 1 0  Total GAD 7 Score 4 4   Anxiety Difficulty Not difficult at all Not difficult at all Not difficult at all     Review of Systems:   Pertinent items are noted in HPI Denies any bowel changes or  pelvic pain.   Pertinent History Reviewed:  Reviewed past medical,surgical, social and family history.  Reviewed problem list, medications and allergies. Physical Assessment:   Vitals:   07/23/24 1016  BP: 131/88  Pulse: 100  SpO2: 100%  Weight: 189 lb 9.6 oz (86 kg)  Height: 5' 5 (1.651 m)  Body mass index is 31.55 kg/m.        Physical Examination:   General appearance - well appearing, and in no distress  Mental status - alert, oriented to person, place, and time  Psych:  She has a normal mood and affect  Skin - warm and dry, normal color, no suspicious lesions noted  Chest - effort normal, all lung fields clear to auscultation bilaterally  Heart - normal rate and regular rhythm  Neck:  midline trachea, no thyromegaly or nodules  Breasts - breasts appear normal, no suspicious masses, no skin or nipple changes or  axillary nodes  Abdomen - soft, nontender, nondistended, no masses or organomegaly  Pelvic - VULVA:  normal appearing vulva with no masses, tenderness or lesions   VAGINA: pale and atrophic tissue noted  Thin prep pap is not indicated today  UTERUS: uterus is felt to be normal size, shape, consistency and nontender   ADNEXA: No adnexal masses or tenderness noted.  Rectal - deferred  Extremities:  No swelling or varicosities noted  Chaperone present for exam  Results for orders placed or performed in visit on 07/23/24 (from the past 24 hours)  POCT URINALYSIS DIP (CLINITEK)   Collection Time: 07/23/24 10:34 AM  Result Value Ref Range   Color, UA yellow yellow   Clarity, UA clear clear   Glucose, UA negative negative mg/dL   Bilirubin, UA negative negative   Ketones, POC UA negative negative mg/dL   Spec Grav, UA 8.979 8.989 - 1.025   Blood, UA negative negative   pH, UA 7.0 5.0 - 8.0   POC PROTEIN,UA negative negative, trace   Urobilinogen, UA 0.2 0.2 or 1.0 E.U./dL   Nitrite, UA Negative Negative   Leukocytes, UA Negative Negative    Assessment & Plan:   1. Well woman exam with routine gynecological exam (Primary) - Pap smear ASCUS with neg HR HPV 05/15/2022.  Will repeat next year.   - Mammogram 07/05/2023.  Pt aware to schedule.   - Colonoscopy 08/26/2023 - Bone mineral density not indicated at this time - lab work done with PCP, Lucie Buttner - vaccines reviewed/updated  2. Urinary urgency - is still doing pelvic PT - POCT URINALYSIS DIP - negative today.  3. Vaginal atrophy - recommended starting vaginal estrogen given continued urinary symptoms, physical exam findings and negative urine testing today.   - estradiol  (ESTRACE ) 0.01 % CREA vaginal cream; 1 gram vaginally twice weekly  Dispense: 42.5 g; Refill: 3  4. Obsessive-compulsive disorder, unspecified type - seeing therapist at this time.  Open to considering medication.     Orders Placed This Encounter  Procedures   POCT URINALYSIS DIP (CLINITEK)    Meds:  Meds ordered this encounter  Medications   estradiol  (ESTRACE ) 0.01 % CREA vaginal cream    Sig: 1 gram vaginally twice weekly    Dispense:  42.5 g    Refill:  3    Follow-up: Return in about 1 year (around 07/23/2025).  Ronal GORMAN Pinal, MD 07/23/2024 12:03 PM

## 2024-07-24 DIAGNOSIS — F411 Generalized anxiety disorder: Secondary | ICD-10-CM | POA: Diagnosis not present

## 2024-07-24 DIAGNOSIS — F422 Mixed obsessional thoughts and acts: Secondary | ICD-10-CM | POA: Diagnosis not present

## 2024-07-24 DIAGNOSIS — F5089 Other specified eating disorder: Secondary | ICD-10-CM | POA: Diagnosis not present

## 2024-07-24 DIAGNOSIS — F4312 Post-traumatic stress disorder, chronic: Secondary | ICD-10-CM | POA: Diagnosis not present

## 2024-07-28 DIAGNOSIS — Z713 Dietary counseling and surveillance: Secondary | ICD-10-CM | POA: Diagnosis not present

## 2024-08-05 DIAGNOSIS — Z713 Dietary counseling and surveillance: Secondary | ICD-10-CM | POA: Diagnosis not present

## 2024-08-07 DIAGNOSIS — F422 Mixed obsessional thoughts and acts: Secondary | ICD-10-CM | POA: Diagnosis not present

## 2024-08-07 DIAGNOSIS — F411 Generalized anxiety disorder: Secondary | ICD-10-CM | POA: Diagnosis not present

## 2024-08-07 DIAGNOSIS — F5089 Other specified eating disorder: Secondary | ICD-10-CM | POA: Diagnosis not present

## 2024-08-07 DIAGNOSIS — F4312 Post-traumatic stress disorder, chronic: Secondary | ICD-10-CM | POA: Diagnosis not present

## 2024-08-12 DIAGNOSIS — Z713 Dietary counseling and surveillance: Secondary | ICD-10-CM | POA: Diagnosis not present

## 2024-08-14 DIAGNOSIS — F5089 Other specified eating disorder: Secondary | ICD-10-CM | POA: Diagnosis not present

## 2024-08-14 DIAGNOSIS — F411 Generalized anxiety disorder: Secondary | ICD-10-CM | POA: Diagnosis not present

## 2024-08-14 DIAGNOSIS — F422 Mixed obsessional thoughts and acts: Secondary | ICD-10-CM | POA: Diagnosis not present

## 2024-08-14 DIAGNOSIS — F4312 Post-traumatic stress disorder, chronic: Secondary | ICD-10-CM | POA: Diagnosis not present

## 2024-08-21 DIAGNOSIS — F4312 Post-traumatic stress disorder, chronic: Secondary | ICD-10-CM | POA: Diagnosis not present

## 2024-08-21 DIAGNOSIS — F411 Generalized anxiety disorder: Secondary | ICD-10-CM | POA: Diagnosis not present

## 2024-08-21 DIAGNOSIS — F422 Mixed obsessional thoughts and acts: Secondary | ICD-10-CM | POA: Diagnosis not present

## 2024-08-21 DIAGNOSIS — F5089 Other specified eating disorder: Secondary | ICD-10-CM | POA: Diagnosis not present

## 2024-10-01 ENCOUNTER — Encounter: Admitting: Physician Assistant

## 2024-11-26 ENCOUNTER — Encounter: Admitting: Physician Assistant
